# Patient Record
Sex: Female | Born: 1945 | Race: White | Hispanic: No | Marital: Married | Smoking: Never smoker
Health system: Southern US, Community
[De-identification: ages and names within clinical notes are randomized; demographics above are authoritative.]

## PROBLEM LIST (undated history)

## (undated) DIAGNOSIS — E039 Hypothyroidism, unspecified: Secondary | ICD-10-CM

## (undated) DIAGNOSIS — T7840XA Allergy, unspecified, initial encounter: Secondary | ICD-10-CM

## (undated) DIAGNOSIS — J302 Other seasonal allergic rhinitis: Secondary | ICD-10-CM

## (undated) DIAGNOSIS — K5790 Diverticulosis of intestine, part unspecified, without perforation or abscess without bleeding: Secondary | ICD-10-CM

## (undated) DIAGNOSIS — M797 Fibromyalgia: Secondary | ICD-10-CM

## (undated) DIAGNOSIS — I071 Rheumatic tricuspid insufficiency: Secondary | ICD-10-CM

## (undated) DIAGNOSIS — K449 Diaphragmatic hernia without obstruction or gangrene: Secondary | ICD-10-CM

## (undated) DIAGNOSIS — I10 Essential (primary) hypertension: Secondary | ICD-10-CM

## (undated) DIAGNOSIS — K222 Esophageal obstruction: Secondary | ICD-10-CM

## (undated) DIAGNOSIS — G2581 Restless legs syndrome: Secondary | ICD-10-CM

## (undated) DIAGNOSIS — M1990 Unspecified osteoarthritis, unspecified site: Secondary | ICD-10-CM

## (undated) DIAGNOSIS — K219 Gastro-esophageal reflux disease without esophagitis: Secondary | ICD-10-CM

## (undated) HISTORY — PX: ESOPHAGOGASTRODUODENOSCOPY: SHX1529

## (undated) HISTORY — PX: COLONOSCOPY: SHX174

## (undated) HISTORY — DX: Gastro-esophageal reflux disease without esophagitis: K21.9

## (undated) HISTORY — DX: Essential (primary) hypertension: I10

## (undated) HISTORY — DX: Diverticulosis of intestine, part unspecified, without perforation or abscess without bleeding: K57.90

## (undated) HISTORY — DX: Esophageal obstruction: K22.2

## (undated) HISTORY — DX: Other seasonal allergic rhinitis: J30.2

## (undated) HISTORY — DX: Fibromyalgia: M79.7

## (undated) HISTORY — DX: Unspecified osteoarthritis, unspecified site: M19.90

## (undated) HISTORY — DX: Allergy, unspecified, initial encounter: T78.40XA

## (undated) HISTORY — PX: SAPHENOUS VEIN GRAFT RESECTION: SHX2374

## (undated) HISTORY — PX: GRAFT DESCENDING THORACIC AORTA: SUR646

## (undated) HISTORY — PX: BRACHIAL ARTERY GRAFT: SHX1258

## (undated) HISTORY — DX: Diaphragmatic hernia without obstruction or gangrene: K44.9

---

## 2009-10-05 ENCOUNTER — Emergency Department (HOSPITAL_COMMUNITY): Admission: EM | Admit: 2009-10-05 | Discharge: 2009-10-06 | Payer: Self-pay | Admitting: Emergency Medicine

## 2009-10-23 ENCOUNTER — Ambulatory Visit: Payer: Self-pay | Admitting: Internal Medicine

## 2009-10-29 ENCOUNTER — Ambulatory Visit: Payer: Self-pay | Admitting: Internal Medicine

## 2009-10-29 ENCOUNTER — Encounter: Payer: Self-pay | Admitting: Internal Medicine

## 2009-10-30 ENCOUNTER — Encounter: Payer: Self-pay | Admitting: Internal Medicine

## 2011-01-19 ENCOUNTER — Encounter: Payer: Self-pay | Admitting: Internal Medicine

## 2011-04-03 LAB — URINE MICROSCOPIC-ADD ON

## 2011-04-03 LAB — LIPASE, BLOOD: Lipase: 14 U/L (ref 11–59)

## 2011-04-03 LAB — BASIC METABOLIC PANEL
Chloride: 103 mEq/L (ref 96–112)
GFR calc Af Amer: >60 mL/min (ref >60)
GFR calc non Af Amer: >60 mL/min (ref >60)
Potassium: 3.1 mEq/L — ABNORMAL LOW (ref 3.5–5.1)
Sodium: 139 mEq/L (ref 135–145)

## 2011-04-03 LAB — CBC
HCT: 40.7 % (ref 36.0–46.0)
Platelets: 213 10*3/uL (ref 150–400)
WBC: 7.4 10*3/uL (ref 4.0–10.5)

## 2011-04-03 LAB — URINALYSIS, ROUTINE W REFLEX MICROSCOPIC
Bilirubin Urine: NEGATIVE
Leukocytes, UA: NEGATIVE
Nitrite: NEGATIVE
Specific Gravity, Urine: 1.011 (ref 1.005–1.030)
pH: 8 (ref 5.0–8.0)

## 2011-04-03 LAB — HEPATIC FUNCTION PANEL
Albumin: 3.8 g/dL (ref 3.5–5.2)
Alkaline Phosphatase: 90 U/L (ref 39–117)
Total Protein: 7.3 g/dL (ref 6.0–8.3)

## 2011-04-03 LAB — DIFFERENTIAL
Eosinophils Absolute: 0.2 10*3/uL (ref 0.0–0.7)
Lymphocytes Relative: 22 % (ref 12–46)
Lymphs Abs: 1.6 10*3/uL (ref 0.7–4.0)
Neutrophils Relative %: 67 % (ref 43–77)

## 2011-04-03 LAB — POCT CARDIAC MARKERS

## 2012-05-16 ENCOUNTER — Telehealth: Payer: Self-pay | Admitting: Internal Medicine

## 2012-05-16 ENCOUNTER — Ambulatory Visit (HOSPITAL_COMMUNITY): Admission: EM | Admit: 2012-05-16 | Payer: Self-pay | Source: Ambulatory Visit | Admitting: Internal Medicine

## 2012-05-16 ENCOUNTER — Telehealth: Payer: Self-pay

## 2012-05-16 NOTE — Telephone Encounter (Signed)
Please advise--looks like pt might be at ER for endoscopy??

## 2012-05-16 NOTE — Telephone Encounter (Signed)
She called this afternoon with food impaction symptoms. I had her come to [facility] but while on the way the impaction was relieved.  She is able to swallow liquids and is handling secretions.  I evaluated her in ED waiting.  She has been having intermittent dysphagia.  Advised OTC Prilosec or Prevacid and told her we would call to arrange follow-up and probable EGD/dilation with Dr. Juanda Chance.

## 2012-05-16 NOTE — Telephone Encounter (Signed)
Pt states that she is having trouble with her esophagus, she is having to make herself burp and whenever she eats fast the food will not go down she will have to spit it back up, if she eats or drinks too fast it will not go down. Pt would like to know what she should do.

## 2012-05-17 NOTE — Telephone Encounter (Signed)
Phone answered by a child. Child states the patient wants to call me back.

## 2012-05-17 NOTE — Telephone Encounter (Signed)
Unable to reach patient at the numbers in computer. The home number is not working and the other 2 have been disconnected.

## 2012-05-17 NOTE — Telephone Encounter (Signed)
Please set up for EGD/dil., ED listed her tel # [phone], have you tried that?

## 2012-05-17 NOTE — Telephone Encounter (Signed)
Called number below and it was not the phone number for patient but the person answering told me to try [phone]. Tried this number and was unable to reach patient.

## 2012-05-17 NOTE — Telephone Encounter (Signed)
Chart reviewed.  Patient also contacted GI and was advised to go to the ED.  On the way there, her symptoms resolved.  Dr. Leone Payor saw her in ED waiting and is arranging follow-up with Dr. Juanda Chance.

## 2012-05-18 NOTE — Telephone Encounter (Signed)
Addended by: Daphine Deutscher on: 05/18/2012 08:58 AM   Modules accepted: Orders

## 2012-05-18 NOTE — Telephone Encounter (Signed)
Spoke with patient and scheduled her for EGD with dil on 05/25/12 at 4:00 PM at [facility]. Previsit on 05/20/12 ay 4:00 PM

## 2012-05-18 NOTE — Telephone Encounter (Signed)
Spoke with patient's family and was given her cell of [phone]. Spoke with patient and told her Dr. Juanda Chance wants her to have an EGD with dil. Dr. Juanda Chance

## 2012-05-20 ENCOUNTER — Ambulatory Visit (AMBULATORY_SURGERY_CENTER): Payer: Medicare Other | Admitting: *Deleted

## 2012-05-20 DIAGNOSIS — R1310 Dysphagia, unspecified: Secondary | ICD-10-CM

## 2012-05-25 ENCOUNTER — Encounter: Payer: Self-pay | Admitting: Internal Medicine

## 2012-05-25 ENCOUNTER — Ambulatory Visit (AMBULATORY_SURGERY_CENTER): Payer: Medicare Other | Admitting: Internal Medicine

## 2012-05-25 DIAGNOSIS — K222 Esophageal obstruction: Secondary | ICD-10-CM

## 2012-05-25 DIAGNOSIS — R1310 Dysphagia, unspecified: Secondary | ICD-10-CM

## 2012-05-25 MED ORDER — RANITIDINE HCL 150 MG PO TABS: 150.0000 mg | ORAL_TABLET | Freq: Every day | ORAL | Status: DC

## 2012-05-25 MED ORDER — SODIUM CHLORIDE 0.9 % IV SOLN: 500.0000 mL | INTRAVENOUS | Status: DC

## 2012-05-25 NOTE — Progress Notes (Signed)
No complaints noted in the recovery room. Maw  Patient did not experience any of the following events: a burn prior to discharge; a fall within the facility; wrong site/side/patient/procedure/implant event; or a hospital transfer or hospital admission upon discharge from the facility. (G8907) Patient did not have preoperative order for IV antibiotic SSI prophylaxis. (G8918)  

## 2012-05-25 NOTE — Op Note (Signed)
[facility] [address] [place], [place]  [address]  ENDOSCOPY PROCEDURE REPORT  PATIENT:  Melanie Nash, Melanie Nash  MR#:  [id number] BIRTHDATE:  August 18, 1946, 65 yrs. old  GENDER:  female  ENDOSCOPIST:  Hedwig Morton. Juanda Chance, MD Referred by:  Samara Deist, M.D.  PROCEDURE DATE:  05/25/2012 PROCEDURE:  EGD with dilatation over guidewire ASA CLASS:  Class II INDICATIONS:  dysphagia, food impaction recent food impaction, passed spontaneously on the way to the ED  MEDICATIONS:   MAC sedation, administered by CRNA, propofol (Diprivan) 200 mg TOPICAL ANESTHETIC:  none  DESCRIPTION OF PROCEDURE:   After the risks benefits and alternatives of the procedure were thoroughly explained, informed consent was obtained.  The LB GIF-H180 [id number] endoscope was introduced through the mouth and advanced to the second portion of the duodenum, without limitations.  The instrument was slowly withdrawn as the mucosa was fully examined. <<PROCEDUREIMAGES>>  A stricture was found (see image1, image2, image7, and image6). concentric,benign appearing, fibrous stricture at the g-e junction Savary dilation over a guidewire 13,14,15,75mm dilators passed, blood on the last dilator  A hiatal hernia was found (see image5 and image3). 2 cm hiatal hernia  Otherwise the examination was normal (see image4).    Retroflexed views revealed no abnormalities.    The scope was then withdrawn from the patient and the procedure completed.  COMPLICATIONS:  None  ENDOSCOPIC IMPRESSION: 1) Stricture 2) Hiatal hernia 3) Otherwise normal examination benign diastal esophageal stricture, dilated to 101F with Savary dilator RECOMMENDATIONS: 1) Anti-reflux regimen to be follow Ranitidine 150 mg hs  REPEAT EXAM:  In 0 year(s) for.  ______________________________ Hedwig Morton. Juanda Chance, MD  CC:  n. eSIGNED:   Hedwig Morton. Nicha Hemann at 05/25/2012 04:11 PM  Frann Rider, [phone]

## 2012-05-25 NOTE — Patient Instructions (Signed)
Handout was given to your care partner on dilatation diet, anti-reflux and hiatal hernia.  New rx for Ranitidine was also given to your care partner.  You may resume your prior medications today.  Please follow the dilatation diet the rest of the day.  Please call if any questions or concerns.   YOU HAD AN ENDOSCOPIC PROCEDURE TODAY AT THE [facility]: Refer to the procedure report that was given to you for any specific questions about what was found during the examination.  If the procedure report does not answer your questions, please call your gastroenterologist to clarify.  If you requested that your care partner not be given the details of your procedure findings, then the procedure report has been included in a sealed envelope for you to review at your convenience later.  YOU SHOULD EXPECT: Some feelings of bloating in the abdomen. Passage of more gas than usual.  Walking can help get rid of the air that was put into your GI tract during the procedure and reduce the bloating. If you had a lower endoscopy (such as a colonoscopy or flexible sigmoidoscopy) you may notice spotting of blood in your stool or on the toilet paper. If you underwent a bowel prep for your procedure, then you may not have a normal bowel movement for a few days.  DIET:   Drink plenty of fluids but you should avoid alcoholic beverages for 24 hours.  Please follow the dilatation diet the rest of the day.  ACTIVITY: Your care partner should take you home directly after the procedure.  You should plan to take it easy, moving slowly for the rest of the day.  You can resume normal activity the day after the procedure however you should NOT DRIVE or use heavy machinery for 24 hours (because of the sedation medicines used during the test).    SYMPTOMS TO REPORT IMMEDIATELY: A gastroenterologist can be reached at any hour.  During normal business hours, 8:30 AM to 5:00 PM Monday through Friday, call [phone].  After  hours and on weekends, please call the GI answering service at [phone] who will take a message and have the physician on call contact you.     Following upper endoscopy (EGD)  Vomiting of blood or coffee ground material  New chest pain or pain under the shoulder blades  Painful or persistently difficult swallowing  New shortness of breath  Fever of 100F or higher  Black, tarry-looking stools  FOLLOW UP: If any biopsies were taken you will be contacted by phone or by letter within the next 1-3 weeks.  Call your gastroenterologist if you have not heard about the biopsies in 3 weeks.  Our staff will call the home number listed on your records the next business day following your procedure to check on you and address any questions or concerns that you may have at that time regarding the information given to you following your procedure. This is a courtesy call and so if there is no answer at the home number and we have not heard from you through the emergency physician on call, we will assume that you have returned to your regular daily activities without incident.  SIGNATURES/CONFIDENTIALITY: You and/or your care partner have signed paperwork which will be entered into your electronic medical record.  These signatures attest to the fact that that the information above on your After Visit Summary has been reviewed and is understood.  Full responsibility of the confidentiality of this discharge  information lies with you and/or your care-partner.

## 2012-05-26 ENCOUNTER — Telehealth: Payer: Self-pay | Admitting: *Deleted

## 2012-05-26 NOTE — Telephone Encounter (Signed)
No answer left message to call if questions or concerns. 

## 2012-05-28 NOTE — Progress Notes (Signed)
Addended by: Maple Hudson on: 05/28/2012 07:34 AM   Modules accepted: Level of Service

## 2014-03-03 ENCOUNTER — Encounter: Payer: Self-pay | Admitting: Internal Medicine

## 2014-03-08 ENCOUNTER — Encounter: Payer: Self-pay | Admitting: *Deleted

## 2014-05-02 ENCOUNTER — Encounter: Payer: Self-pay | Admitting: Internal Medicine

## 2014-05-02 ENCOUNTER — Ambulatory Visit (INDEPENDENT_AMBULATORY_CARE_PROVIDER_SITE_OTHER): Payer: Medicare HMO | Admitting: Internal Medicine

## 2014-05-02 VITALS — BP 124/90 | HR 84 | Ht 61.5 in | Wt 166.4 lb

## 2014-05-02 DIAGNOSIS — R1319 Other dysphagia: Secondary | ICD-10-CM

## 2014-05-02 NOTE — Progress Notes (Signed)
Melanie Nash 08/02/1946 [id number]  Note: This dictation was prepared with Dragon digital system. Any transcriptional errors that result from this procedure are unintentional.   History of Present Illness:  This is a 68 year old white female with recurrent solid food dysphagia. She has a history of esophageal stricture which was dilated in May 2013. She had a food impaction prior to that which passed spontaneously on the way to the emergency room. She was subsequently dilated with 13, 14, 15 and 16 mm dilators. She has a hiatal hernia. Patient has not been taking any acid reducing agents. She denies heartburn but has some substernal chest pain when she eats. She has had multiple episodes of food regurgitation. Her colorectal screening is up to date. Her last colonoscopy in November 2010 showed mild diverticulosis and prep induced inflammation in the left colon.    Past Medical History  Diagnosis Date  . Fibromyalgia   . Seasonal allergies   . Arthritis   . GERD (gastroesophageal reflux disease)   . Diverticulosis   . Hiatal hernia   . Esophageal stricture     Past Surgical History  Procedure Laterality Date  . Esophagogastroduodenoscopy      dysphagia    No Known Allergies  Family history and social history have been reviewed.  Review of Systems: Solid food dysphagia. Negative for cough asthma or hoarseness  The remainder of the 10 point ROS is negative except as outlined in the H&P  Physical Exam: General Appearance Well developed, in no distress Eyes  Non icteric  HEENT  Non traumatic, normocephalic  Mouth No lesion, tongue papillated, no cheilosis Neck Supple without adenopathy, thyroid not enlarged, no carotid bruits, no JVD Lungs Clear to auscultation bilaterally COR Normal S1, normal S2, regular rhythm, no murmur, quiet precordium Abdomen soft nontender Rectal not done Extremities  No pedal edema Skin No lesions Neurological Alert and oriented x 3 Psychological  Normal mood and affect  Assessment and Plan:   Problem #1 Recurrent solid food dysphagia consistent with benign distal esophageal stricture. Her last dilatation was 2 years ago. I have discussed gastroesophageal reflux with the patient and asked her to take PPIs on a daily basis. We will proceed with an upper endoscopy and biopsies to rule out Barrett's esophagus and esophageal dilation. We will also remind her of an appropriate antireflux regimen. She takes occasional ibuprofen but not on a regular basis.  Problem #2 Colorectal screening. Patient's last colonoscopy was in November 2010. Her next colonoscopy will be due in November 2020.    Lafayette Dragon 05/02/2014

## 2014-05-02 NOTE — Patient Instructions (Addendum)
You have been scheduled for an endoscopy with propofol. Please follow written instructions given to you at your visit today. If you use inhalers (even only as needed), please bring them with you on the day of your procedure. Your physician has requested that you go to www.startemmi.com and enter the access code given to you at your visit today. This web site gives a general overview about your procedure. However, you should still follow specific instructions given to you by our office regarding your preparation for the procedure.  CC: Dr Judeen Hammans

## 2014-05-03 ENCOUNTER — Encounter: Payer: Medicare HMO | Admitting: Internal Medicine

## 2014-05-08 ENCOUNTER — Encounter: Payer: Self-pay | Admitting: Internal Medicine

## 2014-05-10 ENCOUNTER — Encounter: Payer: Self-pay | Admitting: Internal Medicine

## 2014-05-10 ENCOUNTER — Ambulatory Visit (AMBULATORY_SURGERY_CENTER): Payer: Medicare HMO | Admitting: Internal Medicine

## 2014-05-10 VITALS — BP 166/94 | HR 83 | Temp 97.9°F | Resp 19 | Ht 62.0 in | Wt 161.0 lb

## 2014-05-10 DIAGNOSIS — R1310 Dysphagia, unspecified: Secondary | ICD-10-CM

## 2014-05-10 DIAGNOSIS — K222 Esophageal obstruction: Secondary | ICD-10-CM

## 2014-05-10 MED ORDER — SODIUM CHLORIDE 0.9 % IV SOLN: 500.0000 mL | INTRAVENOUS | Status: DC

## 2014-05-10 MED ORDER — OMEPRAZOLE 20 MG PO CPDR: 20.0000 mg | DELAYED_RELEASE_CAPSULE | Freq: Every day | ORAL | Status: DC

## 2014-05-10 NOTE — Progress Notes (Signed)
Report to pacu rn, vss, bbs=clear 

## 2014-05-10 NOTE — Patient Instructions (Signed)
YOU HAD AN ENDOSCOPIC PROCEDURE TODAY AT [facility]: Refer to the procedure report that was given to you for any specific questions about what was found during the examination.  If the procedure report does not answer your questions, please call your gastroenterologist to clarify.  If you requested that your care partner not be given the details of your procedure findings, then the procedure report has been included in a sealed envelope for you to review at your convenience later.  YOU SHOULD EXPECT: Some feelings of bloating in the abdomen. Passage of more gas than usual.  Walking can help get rid of the air that was put into your GI tract during the procedure and reduce the bloating. If you had a lower endoscopy (such as a colonoscopy or flexible sigmoidoscopy) you may notice spotting of blood in your stool or on the toilet paper. If you underwent a bowel prep for your procedure, then you may not have a normal bowel movement for a few days.  DIET: Your first meal following the procedure should be a light meal and then it is ok to progress to your normal diet.  A half-sandwich or bowl of soup is an example of a good first meal.  Heavy or fried foods are harder to digest and may make you feel nauseous or bloated.  Likewise meals heavy in dairy and vegetables can cause extra gas to form and this can also increase the bloating.  Drink plenty of fluids but you should avoid alcoholic beverages for 24 hours.  ACTIVITY: Your care partner should take you home directly after the procedure.  You should plan to take it easy, moving slowly for the rest of the day.  You can resume normal activity the day after the procedure however you should NOT DRIVE or use heavy machinery for 24 hours (because of the sedation medicines used during the test).    SYMPTOMS TO REPORT IMMEDIATELY: A gastroenterologist can be reached at any hour.  During normal business hours, 8:30 AM to 5:00 PM Monday through Friday,  call [phone].  After hours and on weekends, please call the GI answering service at [phone] who will take a message and have the physician on call contact you.     Following upper endoscopy (EGD)  Vomiting of blood or coffee ground material  New chest pain or pain under the shoulder blades  Painful or persistently difficult swallowing  New shortness of breath  Fever of 100F or higher  Black, tarry-looking stools  FOLLOW UP: If any biopsies were taken you will be contacted by phone or by letter within the next 1-3 weeks.  Call your gastroenterologist if you have not heard about the biopsies in 3 weeks.  Our staff will call the home number listed on your records the next business day following your procedure to check on you and address any questions or concerns that you may have at that time regarding the information given to you following your procedure. This is a courtesy call and so if there is no answer at the home number and we have not heard from you through the emergency physician on call, we will assume that you have returned to your regular daily activities without incident.   Stricture information given.  Anti reflux regimen information given.  Start Prilosec as directed.  SIGNATURES/CONFIDENTIALITY: You and/or your care partner have signed paperwork which will be entered into your electronic medical record.  These signatures attest to the fact that  that the information above on your After Visit Summary has been reviewed and is understood.  Full responsibility of the confidentiality of this discharge information lies with you and/or your care-partner.

## 2014-05-10 NOTE — Op Note (Signed)
[employer]  [address]. [place], [address]   ENDOSCOPY PROCEDURE REPORT  PATIENT: Melanie Nash, Melanie Nash  MR#: [id number] BIRTHDATE: [birth year] , 67  yrs. old GENDER: Female ENDOSCOPIST: Lafayette Dragon, MD REFERRED BY:  Judeen Hammans, M.D. PROCEDURE DATE:  05/10/2014 PROCEDURE:  EGD, diagnostic and Savary dilation of esophagus ASA CLASS:     Class II INDICATIONS:  Dysphagia.   dysphagia to solids.  History of food impaction.  Last endoscopy May 2013.  Reason onset of dysphagia. MEDICATIONS: MAC sedation, administered by CRNA and Propofol (Diprivan) 180 mg IV TOPICAL ANESTHETIC: Cetacaine Spray  DESCRIPTION OF PROCEDURE: After the risks benefits and alternatives of the procedure were thoroughly explained, informed consent was obtained.  The LB GIF-H180 Loaner [id number] endoscope was introduced through the mouth and advanced to the second portion of the duodenum. Without limitations.  The instrument was slowly withdrawn as the mucosa was fully examined.      Esophagus: Proximal and midesophagus was normal. There was a concentric benign appearing distal esophageal stricture located at the GE junction which initially did not allow the scope to pass through but with mild resistance and pressure the endoscope was able to traverse into the stomach. There was small amount of bleeding from the stricture. Distal to the stricture was a nonreducible 3 cm hiatal hernia Stomach gastric folds were normal gastric antrum pyloric outlet were unremarkable. Retroflexion of the endoscope confirmed presence of hiatal hernia  Duodenum: duodenal bulb and descending duodenum was normal[ Savary dilators to pass through the stricture starting with a 13 mm, 14 mm, 15 mm, 16 mm dilators there was blood on the last 2 dilators.          The scope was then withdrawn from the patient and the procedure completed.  COMPLICATIONS: There were no complications. ENDOSCOPIC IMPRESSION: benign distal  esophageal stricture status post dilation from 13-to 16 mm    with Savary dilators 3 cm nonreducible hiatal hernia RECOMMENDATIONS: 1.  Anti-reflux regimen to be follow 2.  continue acid suppressing therapy repeat dilatation as needed  REPEAT EXAM:  eSigned:  Lafayette Dragon, MD 05/10/2014 3:06 PM   CC:  PATIENT NAME:  Melanie Nash, Melanie Nash MR#: [id number]

## 2014-05-10 NOTE — Progress Notes (Signed)
Called to room to assist during endoscopic procedure.  Patient ID and intended procedure confirmed with present staff. Received instructions for my participation in the procedure from the performing physician.  

## 2014-05-11 ENCOUNTER — Encounter: Payer: Self-pay | Admitting: Internal Medicine

## 2014-05-11 ENCOUNTER — Telehealth: Payer: Self-pay | Admitting: *Deleted

## 2014-05-11 NOTE — Telephone Encounter (Signed)
  Follow up Call-  Call back number 05/10/2014 05/25/2012  Post procedure Call Back phone  # [phone] home  [phone] cell [phone]  Permission to leave phone message Yes Yes     Patient questions:  Do you have a fever, pain , or abdominal swelling? no Pain Score  0 *  Have you tolerated food without any problems? yes  Have you been able to return to your normal activities? yes  Do you have any questions about your discharge instructions: Diet   no Medications  no Follow up visit  no  Do you have questions or concerns about your Care? no  Actions: * If pain score is 4 or above: No action needed, pain <4.

## 2015-05-31 ENCOUNTER — Telehealth: Payer: Self-pay | Admitting: *Deleted

## 2015-05-31 DIAGNOSIS — K219 Gastro-esophageal reflux disease without esophagitis: Secondary | ICD-10-CM

## 2015-05-31 DIAGNOSIS — K222 Esophageal obstruction: Secondary | ICD-10-CM

## 2015-05-31 MED ORDER — OMEPRAZOLE 20 MG PO CPDR: 20.0000 mg | DELAYED_RELEASE_CAPSULE | Freq: Every day | ORAL | Status: DC

## 2015-05-31 NOTE — Telephone Encounter (Signed)
Sent Rx for omeprazole, 20 mg, #30 with 11 refills to [facility].

## 2016-02-26 ENCOUNTER — Ambulatory Visit (HOSPITAL_COMMUNITY)
Admission: EM | Admit: 2016-02-26 | Discharge: 2016-02-26 | Disposition: A | Discharge status: Home / Self Care | Payer: PPO | Attending: Emergency Medicine | Admitting: Emergency Medicine

## 2016-02-26 ENCOUNTER — Encounter (HOSPITAL_COMMUNITY)
Admission: EM | Disposition: A | Discharge status: Home / Self Care | Payer: Self-pay | Source: Home / Self Care | Attending: Emergency Medicine

## 2016-02-26 ENCOUNTER — Telehealth: Payer: Self-pay | Admitting: Internal Medicine

## 2016-02-26 ENCOUNTER — Encounter (HOSPITAL_COMMUNITY): Payer: Self-pay | Admitting: Emergency Medicine

## 2016-02-26 DIAGNOSIS — Y9389 Activity, other specified: Secondary | ICD-10-CM | POA: Diagnosis not present

## 2016-02-26 DIAGNOSIS — T18128A Food in esophagus causing other injury, initial encounter: Secondary | ICD-10-CM | POA: Diagnosis not present

## 2016-02-26 DIAGNOSIS — K222 Esophageal obstruction: Secondary | ICD-10-CM | POA: Insufficient documentation

## 2016-02-26 DIAGNOSIS — X58XXXA Exposure to other specified factors, initial encounter: Secondary | ICD-10-CM | POA: Diagnosis not present

## 2016-02-26 DIAGNOSIS — M797 Fibromyalgia: Secondary | ICD-10-CM | POA: Diagnosis not present

## 2016-02-26 DIAGNOSIS — K219 Gastro-esophageal reflux disease without esophagitis: Secondary | ICD-10-CM | POA: Insufficient documentation

## 2016-02-26 DIAGNOSIS — R1310 Dysphagia, unspecified: Secondary | ICD-10-CM | POA: Diagnosis not present

## 2016-02-26 HISTORY — PX: ESOPHAGOGASTRODUODENOSCOPY: SHX5428

## 2016-02-26 LAB — CBC WITH DIFFERENTIAL/PLATELET
Basophils Absolute: 0 10*3/uL (ref 0.0–0.1)
Basophils Relative: 0 %
Eosinophils Absolute: 0.2 10*3/uL (ref 0.0–0.7)
Eosinophils Relative: 3 %
HCT: 45.4 % (ref 36.0–46.0)
Hemoglobin: 15.5 g/dL — ABNORMAL HIGH (ref 12.0–15.0)
Lymphocytes Relative: 31 %
Lymphs Abs: 2.5 10*3/uL (ref 0.7–4.0)
MCH: 30.2 pg (ref 26.0–34.0)
MCHC: 34.1 g/dL (ref 30.0–36.0)
MCV: 88.3 fL (ref 78.0–100.0)
Monocytes Absolute: 0.6 10*3/uL (ref 0.1–1.0)
Monocytes Relative: 7 %
Neutro Abs: 4.8 10*3/uL (ref 1.7–7.7)
Neutrophils Relative %: 59 %
Platelets: 223 10*3/uL (ref 150–400)
RBC: 5.14 MIL/uL — ABNORMAL HIGH (ref 3.87–5.11)
RDW: 13.4 % (ref 11.5–15.5)
WBC: 8.1 10*3/uL (ref 4.0–10.5)

## 2016-02-26 LAB — I-STAT CHEM 8, ED
BUN: 18 mg/dL (ref 6–20)
Calcium, Ion: 1.11 mmol/L — ABNORMAL LOW (ref 1.13–1.30)
Chloride: 106 mmol/L (ref 101–111)
Creatinine, Ser: 0.8 mg/dL (ref 0.44–1.00)
Glucose, Bld: 89 mg/dL (ref 65–99)
HCT: 49 % — ABNORMAL HIGH (ref 36.0–46.0)
Hemoglobin: 16.7 g/dL — ABNORMAL HIGH (ref 12.0–15.0)
Potassium: 3.6 mmol/L (ref 3.5–5.1)
Sodium: 144 mmol/L (ref 135–145)
TCO2: 26 mmol/L (ref 0–100)

## 2016-02-26 SURGERY — EGD (ESOPHAGOGASTRODUODENOSCOPY)
Anesthesia: Moderate Sedation

## 2016-02-26 MED ORDER — MIDAZOLAM HCL 10 MG/2ML IJ SOLN
INTRAMUSCULAR | Status: DC | PRN
Start: 2016-02-26 — End: 2016-02-26
  Administered 2016-02-26: 1 mg via INTRAVENOUS
  Administered 2016-02-26: 2 mg via INTRAVENOUS
  Administered 2016-02-26: 1 mg via INTRAVENOUS

## 2016-02-26 MED ORDER — BUTAMBEN-TETRACAINE-BENZOCAINE 2-2-14 % EX AERO
INHALATION_SPRAY | CUTANEOUS | Status: DC | PRN
  Administered 2016-02-26: 2 via TOPICAL

## 2016-02-26 MED ORDER — GLUCAGON HCL RDNA (DIAGNOSTIC) 1 MG IJ SOLR
1.0000 mg | Freq: Once | INTRAMUSCULAR | Status: AC
Start: 2016-02-26 — End: 2016-02-26
  Administered 2016-02-26: 1 mg via INTRAVENOUS
  Filled 2016-02-26: qty 1

## 2016-02-26 MED ORDER — ONDANSETRON HCL 4 MG/2ML IJ SOLN
4.0000 mg | Freq: Once | INTRAMUSCULAR | Status: AC
  Administered 2016-02-26: 4 mg via INTRAVENOUS
  Filled 2016-02-26: qty 2

## 2016-02-26 MED ORDER — OMEPRAZOLE 20 MG PO CPDR: 20.0000 mg | DELAYED_RELEASE_CAPSULE | Freq: Two times a day (BID) | ORAL | Status: DC

## 2016-02-26 MED ORDER — FENTANYL CITRATE (PF) 100 MCG/2ML IJ SOLN
INTRAMUSCULAR | Status: AC
  Filled 2016-02-26: qty 2

## 2016-02-26 MED ORDER — DIPHENHYDRAMINE HCL 50 MG/ML IJ SOLN
INTRAMUSCULAR | Status: AC
  Filled 2016-02-26: qty 1

## 2016-02-26 MED ORDER — MIDAZOLAM HCL 5 MG/ML IJ SOLN
INTRAMUSCULAR | Status: AC
  Filled 2016-02-26: qty 2

## 2016-02-26 MED ORDER — FENTANYL CITRATE (PF) 100 MCG/2ML IJ SOLN
INTRAMUSCULAR | Status: DC | PRN
  Administered 2016-02-26: 12.5 ug via INTRAVENOUS
  Administered 2016-02-26: 25 ug via INTRAVENOUS

## 2016-02-26 NOTE — ED Provider Notes (Signed)
CSN: [id number]     Arrival date & time 02/26/16  1300 History   First MD Initiated Contact with Patient 02/26/16 1538     Chief Complaint  Patient presents with  . Dysphagia   Patient is a 70 y.o. female presenting with general illness. The history is provided by the patient and a relative. No language interpreter was used.  Illness Location:  Esophagus Quality:  Difficulty fallowing, food bolus Severity:  Moderate Onset quality:  Gradual Duration:  5 hours Timing:  Intermittent Progression:  Partially resolved Chronicity:  New Context:  History of esophageal stricture s/p dilation.  Relieved by:  None Worsened by:  None Ineffective treatments:  PO intake, liquids Associated symptoms: no abdominal pain, no chest pain, no congestion, no cough, no diarrhea, no ear pain, no fever, no headaches, no nausea, no rash, no rhinorrhea, no shortness of breath, no sore throat, no vomiting and no wheezing     Past Medical History  Diagnosis Date  . Fibromyalgia   . Seasonal allergies   . Arthritis   . GERD (gastroesophageal reflux disease)   . Diverticulosis   . Hiatal hernia   . Esophageal stricture    Past Surgical History  Procedure Laterality Date  . Esophagogastroduodenoscopy      dysphagia   Family History  Problem Relation Age of Onset  . Colon cancer Neg Hx   . Stomach cancer Neg Hx   . Heart disease Mother   . Diabetes Sister   . Diabetes Maternal Uncle    Social History  Substance Use Topics  . Smoking status: Never Smoker   . Smokeless tobacco: Never Used  . Alcohol Use: No   OB History    No data available     Review of Systems  Constitutional: Negative for fever, chills, activity change and appetite change.  HENT: Positive for drooling and trouble swallowing. Negative for congestion, dental problem, ear pain, facial swelling, hearing loss, rhinorrhea, sneezing, sore throat and voice change.   Eyes: Negative for photophobia, pain, redness and visual  disturbance.  Respiratory: Negative for apnea, cough, chest tightness, shortness of breath, wheezing and stridor.   Cardiovascular: Negative for chest pain, palpitations and leg swelling.  Gastrointestinal: Negative for nausea, vomiting, abdominal pain, diarrhea, constipation, blood in stool and abdominal distention.  Endocrine: Negative for polydipsia and polyuria.  Genitourinary: Negative for frequency, hematuria, flank pain, decreased urine volume and difficulty urinating.  Musculoskeletal: Negative for back pain, joint swelling, gait problem, neck pain and neck stiffness.  Skin: Negative for rash and wound.  Allergic/Immunologic: Negative for immunocompromised state.  Neurological: Negative for dizziness, syncope, facial asymmetry, speech difficulty, weakness, light-headedness, numbness and headaches.  Hematological: Negative for adenopathy.  Psychiatric/Behavioral: Negative for suicidal ideas, behavioral problems, confusion, sleep disturbance and agitation. The patient is not nervous/anxious.   All other systems reviewed and are negative.     Allergies  Review of patient's allergies indicates no known allergies.  Home Medications   Prior to Admission medications   Medication Sig Start Date End Date Taking? Authorizing Provider  Cholecalciferol (VITAMIN D-3) 1000 UNITS CAPS Take 1 capsule by mouth daily.   Yes Historical Provider, MD  Cyanocobalamin (VITAMIN B-12 PO) Take 1 tablet by mouth daily.    Yes Historical Provider, MD  fish oil-omega-3 fatty acids 1000 MG capsule Take 2 g by mouth daily.   Yes Historical Provider, MD  GARLIC PO Take 1 tablet by mouth daily.   Yes Historical Provider, MD  MAGNESIUM PO Take  1 tablet by mouth daily.    Yes Historical Provider, MD  Multiple Vitamins-Minerals (ICAPS PO) Take 1 tablet by mouth daily.    Yes Historical Provider, MD  TURMERIC PO Take 1 tablet by mouth daily.   Yes Historical Provider, MD  omeprazole (PRILOSEC) 20 MG capsule Take 1  capsule (20 mg total) by mouth 2 (two) times daily before a meal. Take this medication at [facility] 02/26/16   Vira Blanco, MD   BP 171/108 mmHg  Pulse 91  Temp(Src) 97.8 F (36.6 C) (Oral)  Resp 18  Ht 5\' 2"  (1.575 m)  Wt 70.308 kg  BMI 28.34 kg/m2  SpO2 99% Physical Exam  Constitutional: She is oriented to person, place, and time. She appears well-developed and well-nourished. No distress.  HENT:  Head: Normocephalic and atraumatic.  Right Ear: External ear normal.  Left Ear: External ear normal.  Mouth/Throat: Uvula is midline and oropharynx is clear and moist. No trismus in the jaw. Normal dentition. No uvula swelling. No oropharyngeal exudate.  Eyes: Pupils are equal, round, and reactive to light. Right eye exhibits no discharge. Left eye exhibits no discharge.  Neck: Normal range of motion. No JVD present. No tracheal deviation present.  Cardiovascular: Normal rate, regular rhythm and normal heart sounds.  Exam reveals no friction rub.   No murmur heard. Pulmonary/Chest: Effort normal and breath sounds normal. No stridor. No respiratory distress. She has no wheezes.  Abdominal: Soft. Bowel sounds are normal. She exhibits no distension. There is no rebound and no guarding.  Musculoskeletal: Normal range of motion. She exhibits no edema or tenderness.  Lymphadenopathy:    She has no cervical adenopathy.  Neurological: She is alert and oriented to person, place, and time. No cranial nerve deficit. Coordination normal.  Skin: Skin is warm and dry. No rash noted. No pallor.  Psychiatric: She has a normal mood and affect. Her behavior is normal. Judgment and thought content normal.  Nursing note and vitals reviewed.   ED Course  Procedures (including critical care time) Labs Review Labs Reviewed  CBC WITH DIFFERENTIAL/PLATELET - Abnormal; Notable for the following:    RBC 5.14 (*)    Hemoglobin 15.5 (*)    All other components within normal limits  I-STAT CHEM 8, ED - Abnormal;  Notable for the following:    Calcium, Ion 1.11 (*)    Hemoglobin 16.7 (*)    HCT 49.0 (*)    All other components within normal limits    Imaging Review No results found. I have personally reviewed and evaluated these images and lab results as part of my medical decision-making.   EKG Interpretation None      MDM   Final diagnoses:  Food impaction of esophagus, initial encounter   Patient with history of esophageal stricture status post 3 dilations presents for suspected pork chop piece stuck in esophagus. This occurred around breakfast time this morning. She was having difficulty handling her own secretions in the ED.  Upon returning back to her room patient afebrile with normal heart rate. Blood pressure mildly hypertensive. She is satting fine on room air. No respiratory involvement. Patient tolerating her secretions at this time. She feels improvement in her symptoms. We attempted a by mouth challenge as patient was tolerating her secretions and felt improvement in her sympto in 7-10 days repeat EGD.ms. After several sips of Sprite she spit up.  Ordered IV, basic labs, glucagon. Patient will likely need GI consult at this time.  6:23 PM: Discussed  with gastroenterology who come to bedside and evaluated patient for likely endoscopy.  GI came to bedside performed endoscopy. There was able to push food material past the lower esophageal sphincter. Patient feels better. Patient to take omeprazole twice a day per GI follow-up as an outpatient.  Patient at baseline prior to discharge.    Discussed with Dr. Sabra Heck.    Vira Blanco, MD 02/26/16 [id number]  Noemi Chapel, MD 02/27/16 [phone]

## 2016-02-26 NOTE — H&P (Signed)
  HPI: This is a woman with food impaction   Chief complaint is food impaction  Pork eaten several hours ago, feels still stuck in esohagus.  + spit cup at bedside.  H/o esophageal strictures (last dilated 2015 with Dr. Olevia Perches, savory).  Takes PPI most days of the week.   Past Medical History  Diagnosis Date  . Fibromyalgia   . Seasonal allergies   . Arthritis   . GERD (gastroesophageal reflux disease)   . Diverticulosis   . Hiatal hernia   . Esophageal stricture     Past Surgical History  Procedure Laterality Date  . Esophagogastroduodenoscopy      dysphagia    No current facility-administered medications for this encounter.   Current Outpatient Prescriptions  Medication Sig Dispense Refill  . Cholecalciferol (VITAMIN D-3) 1000 UNITS CAPS Take 1 capsule by mouth daily.    . Cyanocobalamin (VITAMIN B-12 PO) Take 1 tablet by mouth daily.     . fish oil-omega-3 fatty acids 1000 MG capsule Take 2 g by mouth daily.    Marland Kitchen GARLIC PO Take 1 tablet by mouth daily.    Marland Kitchen MAGNESIUM PO Take 1 tablet by mouth daily.     . Multiple Vitamins-Minerals (ICAPS PO) Take 1 tablet by mouth daily.     Marland Kitchen omeprazole (PRILOSEC) 20 MG capsule Take 1 capsule (20 mg total) by mouth daily. Take this medication at [facility]. 30 capsule 11  . TURMERIC PO Take 1 tablet by mouth daily.      Allergies as of 02/26/2016  . (No Known Allergies)    Family History  Problem Relation Age of Onset  . Colon cancer Neg Hx   . Stomach cancer Neg Hx   . Heart disease Mother   . Diabetes Sister   . Diabetes Maternal Uncle     Social History   Social History  . Marital Status: Married    Spouse Name: N/A  . Number of Children: 2  . Years of Education: N/A   Occupational History  . caterer    Social History Main Topics  . Smoking status: Never Smoker   . Smokeless tobacco: Never Used  . Alcohol Use: No  . Drug Use: No  . Sexual Activity: Not on file   Other Topics Concern  . Not on file   Social  History Narrative     Physical Exam: BP 181/98 mmHg  Pulse 83  Temp(Src) 97.7 F (36.5 C) (Oral)  Resp 16  Ht 5\' 2"  (1.575 m)  Wt 155 lb (70.308 kg)  BMI 28.34 kg/m2  SpO2 98% Constitutional: generally well-appearing Psychiatric: alert and oriented x3 Abdomen: soft, nontender, nondistended, no obvious ascites, no peritoneal signs, normal bowel sounds   Assessment and plan: 70 y.o. female with esophageal food impaction  EGD in ER   Owens Loffler, MD [facility] 02/26/2016, 7:37 PM

## 2016-02-26 NOTE — Discharge Instructions (Signed)
Esophageal Stricture °Esophageal stricture is a condition that causes the esophagus to become narrow. The esophagus is the long tube in your throat that carries food and liquid from your mouth to your stomach. Esophageal stricture can make it difficult, painful, or even impossible to swallow. The condition also makes choking more likely.  °CAUSES  °Gastroesophageal reflux disease (GERD) is the most common cause of esophageal stricture. In GERD, stomach acid backs up into the esophagus. Over time, this causes scar tissue and leads to narrowing (stricture). °Other causes of esophageal stricture include: °· Scarring from ingesting a harmful substance. °· Damage from medical instruments used in the esophagus. °· Radiation therapy. °· Cancer. °RISK FACTORS °You are at greater risk for esophageal stricture if you have GERD or esophageal cancer. °SIGNS AND SYMPTOMS  °Signs and symptoms of esophageal stricture include: °· Difficulty swallowing. °· Pain when swallowing. °· Heartburn. °· Vomiting or spitting up (regurgitating) food or liquids. °· Weight loss.   °DIAGNOSIS  °Your health care provider may suspect esophageal stricture based on your symptoms. A physical exam will also be done. You may need tests to confirm the diagnosis. These can include: °· Upper endoscopy. Your health care provider will insert a flexible tube with a tiny camera on it (endoscope) into your esophagus to check for a stricture. A tissue sample may also be taken to be examined under a microscope (biopsy). °· Esophageal pH monitoring. This test involves collecting acid in the esophagus with a tube to determine how much stomach acid is entering the esophagus. °· Barium swallow test. For this test, you will drink a barium solution that coats the lining of the esophagus. Then you will have an X-ray taken. The barium solution helps to show if there is stricture. °TREATMENT °Treatment for esophageal stricture depends on what is causing your condition and  how severe it is. Treatment options include: °· Esophageal dilatation. In this procedure, a health care provider inserts an endoscope or a tool called a dilator into your esophagus to gently stretch it and make the opening wider. °· Stents. In some cases, your health care provider may place a small device (stent) in the esophagus to keep it open. °· Acid-blocking medicines. Taking these helps manage GERD symptoms after an esophageal stricture. This can prevent the stricture from returning. °HOME CARE INSTRUCTIONS °· Do not drink alcohol. °· Do not use any tobacco products, including cigarettes, chewing tobacco, or electronic cigarettes. If you need help quitting, ask your health care provider. °· Lose weight if you are overweight. °· Wear loose, comfortable clothing. °· Do not eat for 3 hours before bedtime. °· Elevate your head in bed with pillows. °· Do not overeat at meals. °· Do not eat foods that make reflux worse. These include: °¨ Fatty foods. °¨ Spicy foods. °¨ Soda. °¨ Tomato products. °¨ Chocolate. °SEEK MEDICAL CARE IF: °· You have problems eating or swallowing. °· You regurgitate food and liquid. °MAKE SURE YOU: °· Understand these instructions. °· Will watch your condition. °· Will get help right away if you are not doing well or get worse. °  °This information is not intended to replace advice given to you by your health care provider. Make sure you discuss any questions you have with your health care provider. °  °Document Released: 08/25/2006 Document Revised: 01/05/2015 Document Reviewed: 04/26/2014 °Elsevier Interactive Patient Education ©2016 [employer] ° °

## 2016-02-26 NOTE — Telephone Encounter (Signed)
Left message for pt to call back  °

## 2016-02-26 NOTE — ED Notes (Signed)
Pt states she ate a piece of a pork chop this am and now feels like it didn't go down all the way. Pt has history of esophagus stretched 3 times. Pt has no sob or trouble speaking.

## 2016-02-26 NOTE — ED Notes (Signed)
Pt asked to be contacted on cell phone number listed on records for follow up appt

## 2016-02-26 NOTE — Op Note (Signed)
ENDOSCOPY PROCEDURE REPORT  PATIENT: Melanie Nash, Melanie Nash  MR#: [id number]  BIRTHDATE: May 04, 1946 , 69  yrs. old GENDER: female  ENDOSCOPIST: Collene Schlichter, MD  PROCEDURE DATE:  02/26/2016  PROCEDURE:  esophagoscopy w/ fb removal    ASA CLASS:     Class II  INDICATIONS:  esophageal food impaction; h/o esophageal stricture (dilated Dr.  Olevia Perches most recently 2015); sporadically takes PPI.   MEDICATIONS: Fentanyl 37.5 mcg IV and Versed 4 mg IV     TOPICAL ANESTHETIC: Cetacaine Spray   DESCRIPTION OF PROCEDURE: After the risks benefits and alternatives of the procedure were thoroughly explained, informed consent was obtained.  The Pentax Gastroscope [id number] endoscope was introduced through the mouth and advanced to the second portion of the duodenum , Without limitations.  The instrument was slowly withdrawn as the mucosa was fully examined. images    There was a white meat food bolus in the distal esophagus, obstructing the lumen.  Using a suction cap, I removed several pieces of the bolus and the remaining food bolus passed into the stomach.  There was an inflamed, focal, peptic appearing stricture at the GE junction with 6-66mm lumen.  I did not dilate the stricture today.  I did not extend the examination into her stomach.  Retroflexion was not performed.     The scope was then withdrawn from the patient and the procedure completed.  COMPLICATIONS: There were no immediate complications.      ENDOSCOPIC IMPRESSION: There was a white meat food bolus in the distal esophagus, obstructing the lumen.  Using a suction cap, I removed several pieces of the bolus and the remaining food bolus passed into the stomach.  There was an inflamed, focal, peptic appearing stricture at the GE junction with 6-69mm lumen.  I did not dilate the stricture today.  I did not extend the examination into her stomach     RECOMMENDATIONS: She will increase to twice daily PPI, to be prescribed by the ER. She will chew her  food very well, eat slowly and take small bites. My office will contact her about repeat EGD with dilation in the next 7-10 days.

## 2016-02-26 NOTE — ED Notes (Signed)
Endoscopy team at bedside 

## 2016-02-27 ENCOUNTER — Encounter (HOSPITAL_COMMUNITY): Payer: Self-pay | Admitting: Gastroenterology

## 2016-02-27 ENCOUNTER — Telehealth: Payer: Self-pay

## 2016-02-27 NOTE — Telephone Encounter (Signed)
Pt never returned call

## 2016-02-27 NOTE — Telephone Encounter (Signed)
      She needs egd in 1-2 weeks, lec with dilation for esophageal stricture.

## 2016-03-06 NOTE — Telephone Encounter (Signed)
I spoke with the pt and she was not available to schedule EGD at this time, she will call back on Monday to set up.

## 2016-03-10 NOTE — Telephone Encounter (Signed)
The pt wants to call back to set up procedure.  She is not ready to schedule.

## 2016-03-14 ENCOUNTER — Ambulatory Visit (AMBULATORY_SURGERY_CENTER): Payer: Self-pay

## 2016-03-14 VITALS — Ht 62.0 in | Wt 177.0 lb

## 2016-03-14 DIAGNOSIS — K222 Esophageal obstruction: Secondary | ICD-10-CM

## 2016-03-14 NOTE — Progress Notes (Signed)
No egg or soy allergies Not on home 02 No previous anesthesia complications No diet or weight loss meds 

## 2016-03-20 DIAGNOSIS — J309 Allergic rhinitis, unspecified: Secondary | ICD-10-CM | POA: Diagnosis not present

## 2016-03-24 ENCOUNTER — Encounter: Payer: Self-pay | Admitting: Gastroenterology

## 2016-03-24 ENCOUNTER — Ambulatory Visit (AMBULATORY_SURGERY_CENTER): Payer: PPO | Admitting: Gastroenterology

## 2016-03-24 VITALS — BP 180/98 | HR 63 | Temp 98.6°F | Resp 15 | Ht 62.0 in | Wt 177.0 lb

## 2016-03-24 DIAGNOSIS — K222 Esophageal obstruction: Secondary | ICD-10-CM

## 2016-03-24 DIAGNOSIS — I10 Essential (primary) hypertension: Secondary | ICD-10-CM | POA: Diagnosis not present

## 2016-03-24 DIAGNOSIS — K219 Gastro-esophageal reflux disease without esophagitis: Secondary | ICD-10-CM | POA: Diagnosis not present

## 2016-03-24 MED ORDER — SODIUM CHLORIDE 0.9 % IV SOLN: 500.0000 mL | INTRAVENOUS | Status: DC

## 2016-03-24 NOTE — Progress Notes (Addendum)
Patient states that she is not taking her prescribed blood pressure medication prescribed by her primary care physician.  She states that she is just taking herbal remedies.  Her bp is 164/110.  She states that she has been taking a lot of antihistamines lately, and amoxicillin for the sinus infection.  She also takes ginger root.   I explained to the patient that she needs to take her bp medication every day, and that her bp is very high.  Patient cannot remember the name of the bp medication prescribed to her.  Again, she did not fill the prescription.  She states a slight headache at the l back of her head today.  States the pain #1.

## 2016-03-24 NOTE — Progress Notes (Signed)
To PACU  Awake and alert.  Report to RN 

## 2016-03-24 NOTE — Patient Instructions (Signed)
YOU HAD AN ENDOSCOPIC PROCEDURE TODAY AT [facility]:   Refer to the procedure report that was given to you for any specific questions about what was found during the examination.  If the procedure report does not answer your questions, please call your gastroenterologist to clarify.  If you requested that your care partner not be given the details of your procedure findings, then the procedure report has been included in a sealed envelope for you to review at your convenience later.  YOU SHOULD EXPECT: Some feelings of bloating in the abdomen. Passage of more gas than usual.  Walking can help get rid of the air that was put into your GI tract during the procedure and reduce the bloating. If you had a lower endoscopy (such as a colonoscopy or flexible sigmoidoscopy) you may notice spotting of blood in your stool or on the toilet paper. If you underwent a bowel prep for your procedure, you may not have a normal bowel movement for a few days.  Please Note:  You might notice some irritation and congestion in your nose or some drainage.  This is from the oxygen used during your procedure.  There is no need for concern and it should clear up in a day or so.  SYMPTOMS TO REPORT IMMEDIATELY:   Following upper endoscopy (EGD)  Vomiting of blood or coffee ground material  New chest pain or pain under the shoulder blades  Painful or persistently difficult swallowing  New shortness of breath  Fever of 100F or higher  Black, tarry-looking stools  For urgent or emergent issues, a gastroenterologist can be reached at any hour by calling [phone].   DIET: See dilation diet-  Drink plenty of fluids but you should avoid alcoholic beverages for 24 hours.  ACTIVITY:  You should plan to take it easy for the rest of today and you should NOT DRIVE or use heavy machinery until tomorrow (because of the sedation medicines used during the test).    FOLLOW UP: Our staff will call the number  listed on your records the next business day following your procedure to check on you and address any questions or concerns that you may have regarding the information given to you following your procedure. If we do not reach you, we will leave a message.  However, if you are feeling well and you are not experiencing any problems, there is no need to return our call.  We will assume that you have returned to your regular daily activities without incident.  If any biopsies were taken you will be contacted by phone or by letter within the next 1-3 weeks.  Please call us at [phone] if you have not heard about the biopsies in 3 weeks.    SIGNATURES/CONFIDENTIALITY: You and/or your care partner have signed paperwork which will be entered into your electronic medical record.  These signatures attest to the fact that that the information above on your After Visit Summary has been reviewed and is understood.  Full responsibility of the confidentiality of this discharge information lies with you and/or your care-partner.  Hiatal hernia, dilation diet-handouts given  Repeat dilation in 3-4 weeks.

## 2016-03-24 NOTE — Progress Notes (Signed)
Pt is aware of BP being eleveated, she is suppose to take BP meds but is not, she will take some once she gets home and follow-up with her primary care doctor.

## 2016-03-24 NOTE — Op Note (Signed)
[place] Patient Name: Melanie Nash Procedure Date: 03/24/2016 9:17 AM MRN: [id number] Endoscopist: Milus Banister , MD Age: 70 Referring MD:  Date of Birth: 05-17-1946 Gender: Female Procedure:                Upper GI endoscopy Indications:              Dysphagia (esophageal food impaction treated with                            EGD last month, Dr. Ardis Hughs; history of GE junction                            stricture, dilated Dr. Olevia Perches 2015) Medicines:                Monitored Anesthesia Care Procedure:                Pre-Anesthesia Assessment:                           - Prior to the procedure, a History and Physical                            was performed, and patient medications and                            allergies were reviewed. The patient's tolerance of                            previous anesthesia was also reviewed. The risks                            and benefits of the procedure and the sedation                            options and risks were discussed with the patient.                            All questions were answered, and informed consent                            was obtained. Prior Anticoagulants: The patient has                            taken no previous anticoagulant or antiplatelet                            agents. ASA Grade Assessment: II - A patient with                            mild systemic disease. After reviewing the risks                            and benefits, the patient was deemed in  satisfactory condition to undergo the procedure.                           After obtaining informed consent, the endoscope was                            passed under direct vision. Throughout the                            procedure, the patient's blood pressure, pulse, and                            oxygen saturations were monitored continuously. The                            Model GIF-HQ190 [id number]) scope was  introduced                            through the mouth, and advanced to the second part                            of duodenum. The upper GI endoscopy was                            accomplished without difficulty. The patient                            tolerated the procedure well. Scope In: Scope Out: Findings:      One moderate benign-appearing, intrinsic stenosis was found at the       gastroesophageal junction. And was traversed. A TTS dilator was passed       through the scope. Dilation with a 13.5-14.5-15.5 mm balloon dilator was       performed to 15.5 mm. The dilation site was examined and showed typical       minor self limited oozing of blood. Estimated blood loss: minimal.      A small hiatal hernia was present.      The exam was otherwise without abnormality. Complications:            No immediate complications. Estimated Blood Loss:     Estimated blood loss: none. Impression:               - Benign-appearing esophageal stenosis. Dilated.                           - Small hiatal hernia.                           - The examination was otherwise normal.                           - No specimens collected. Recommendation:           - Patient has a contact number available for                            emergencies. The  signs and symptoms of potential                            delayed complications were discussed with the                            patient. Return to normal activities tomorrow.                            Written discharge instructions were provided to the                            patient.                           - Resume previous diet.                           - Continue present medications.                           - Repeat upper endoscopy in 3-4 weeks for                            retreatment. Procedure Code(s):        --- Professional ---                           [phone], Esophagogastroduodenoscopy, flexible,                            transoral;  with transendoscopic balloon dilation of                            esophagus (less than 30 mm diameter) CPT copyright 2016 American [employer] Association. All rights reserved. Milus Banister, MD 03/24/2016 9:38:03 AM This report has been signed electronically. Number of Addenda: 0 Referring MD:      Cherly Beach, MD

## 2016-03-24 NOTE — Progress Notes (Signed)
Called to room to assist during endoscopic procedure.  Patient ID and intended procedure confirmed with present staff. Received instructions for my participation in the procedure from the performing physician.  

## 2016-03-25 ENCOUNTER — Telehealth: Payer: Self-pay

## 2016-03-25 NOTE — Telephone Encounter (Signed)
  Follow up Call-  Call back number 03/24/2016 05/10/2014  Post procedure Call Back phone  # [phone] home  [phone] cell  Permission to leave phone message Yes Yes     Patient questions:  Do you have a fever, pain , or abdominal swelling? No. Pain Score  0 *  Have you tolerated food without any problems? Yes.    Have you been able to return to your normal activities? Yes.    Do you have any questions about your discharge instructions: Diet   No. Medications  No. Follow up visit  No.  Do you have questions or concerns about your Care? No.  Actions: * If pain score is 4 or above: No action needed, pain <4.

## 2016-03-31 ENCOUNTER — Ambulatory Visit (AMBULATORY_SURGERY_CENTER): Payer: Self-pay

## 2016-03-31 VITALS — Ht 62.0 in | Wt 175.0 lb

## 2016-03-31 DIAGNOSIS — T18128D Food in esophagus causing other injury, subsequent encounter: Secondary | ICD-10-CM

## 2016-03-31 DIAGNOSIS — W44F3XD Food entering into or through a natural orifice, subsequent encounter: Secondary | ICD-10-CM

## 2016-03-31 NOTE — Progress Notes (Signed)
No allergies to eggs or soy No diet/weight loss meds No past problems with anesthesia No home oxygen  Refused emmi 

## 2016-04-21 ENCOUNTER — Ambulatory Visit (AMBULATORY_SURGERY_CENTER): Payer: PPO | Admitting: Gastroenterology

## 2016-04-21 ENCOUNTER — Encounter: Payer: Self-pay | Admitting: Gastroenterology

## 2016-04-21 VITALS — BP 179/92 | HR 77 | Temp 97.5°F | Resp 14 | Ht 62.0 in | Wt 175.0 lb

## 2016-04-21 DIAGNOSIS — K222 Esophageal obstruction: Secondary | ICD-10-CM

## 2016-04-21 DIAGNOSIS — M797 Fibromyalgia: Secondary | ICD-10-CM | POA: Diagnosis not present

## 2016-04-21 DIAGNOSIS — K219 Gastro-esophageal reflux disease without esophagitis: Secondary | ICD-10-CM | POA: Diagnosis not present

## 2016-04-21 MED ORDER — SODIUM CHLORIDE 0.9 % IV SOLN: 500.0000 mL | INTRAVENOUS | Status: DC

## 2016-04-21 NOTE — Progress Notes (Signed)
Called to room to assist during endoscopic procedure.  Patient ID and intended procedure confirmed with present staff. Received instructions for my participation in the procedure from the performing physician.  

## 2016-04-21 NOTE — Patient Instructions (Signed)
Discharge instructions given. Handouts on a dilatation diet and a hiatal hernia. Resume previous medications. YOU HAD AN ENDOSCOPIC PROCEDURE TODAY AT [facility]:   Refer to the procedure report that was given to you for any specific questions about what was found during the examination.  If the procedure report does not answer your questions, please call your gastroenterologist to clarify.  If you requested that your care partner not be given the details of your procedure findings, then the procedure report has been included in a sealed envelope for you to review at your convenience later.  YOU SHOULD EXPECT: Some feelings of bloating in the abdomen. Passage of more gas than usual.  Walking can help get rid of the air that was put into your GI tract during the procedure and reduce the bloating. If you had a lower endoscopy (such as a colonoscopy or flexible sigmoidoscopy) you may notice spotting of blood in your stool or on the toilet paper. If you underwent a bowel prep for your procedure, you may not have a normal bowel movement for a few days.  Please Note:  You might notice some irritation and congestion in your nose or some drainage.  This is from the oxygen used during your procedure.  There is no need for concern and it should clear up in a day or so.  SYMPTOMS TO REPORT IMMEDIATELY:    Following upper endoscopy (EGD)  Vomiting of blood or coffee ground material  New chest pain or pain under the shoulder blades  Painful or persistently difficult swallowing  New shortness of breath  Fever of 100F or higher  Black, tarry-looking stools  For urgent or emergent issues, a gastroenterologist can be reached at any hour by calling [phone].   DIET: Your first meal following the procedure should be a small meal and then it is ok to progress to your normal diet. Heavy or fried foods are harder to digest and may make you feel nauseous or bloated.  Likewise, meals heavy  in dairy and vegetables can increase bloating.  Drink plenty of fluids but you should avoid alcoholic beverages for 24 hours.  ACTIVITY:  You should plan to take it easy for the rest of today and you should NOT DRIVE or use heavy machinery until tomorrow (because of the sedation medicines used during the test).    FOLLOW UP: Our staff will call the number listed on your records the next business day following your procedure to check on you and address any questions or concerns that you may have regarding the information given to you following your procedure. If we do not reach you, we will leave a message.  However, if you are feeling well and you are not experiencing any problems, there is no need to return our call.  We will assume that you have returned to your regular daily activities without incident.  If any biopsies were taken you will be contacted by phone or by letter within the next 1-3 weeks.  Please call us at [phone] if you have not heard about the biopsies in 3 weeks.    SIGNATURES/CONFIDENTIALITY: You and/or your care partner have signed paperwork which will be entered into your electronic medical record.  These signatures attest to the fact that that the information above on your After Visit Summary has been reviewed and is understood.  Full responsibility of the confidentiality of this discharge information lies with you and/or your care-partner.

## 2016-04-21 NOTE — Op Note (Signed)
[place] Patient Name: Melanie Nash Procedure Date: 04/21/2016 3:03 PM MRN: [id number] Endoscopist: Milus Banister , MD Age: 70 Date of Birth: Jun 30, 1946 Gender: Female Procedure:                Upper GI endoscopy Indications:              Dysphagia, Stenosis of the GI tract Dysphagia                            (esophageal food impaction treatment with EGD                            01/2016, Dr. Ardis Hughs; histor of GE junction                            stricture, dilated Dr. Olevia Perches 2015); EGD Dr. Ardis Hughs                            02/2016 EGD with dilation to 15.60mm Medicines:                Monitored Anesthesia Care Procedure:                Pre-Anesthesia Assessment:                           - Prior to the procedure, a History and Physical                            was performed, and patient medications and                            allergies were reviewed. The patient's tolerance of                            previous anesthesia was also reviewed. The risks                            and benefits of the procedure and the sedation                            options and risks were discussed with the patient.                            All questions were answered, and informed consent                            was obtained. Prior Anticoagulants: The patient has                            taken no previous anticoagulant or antiplatelet                            agents. ASA Grade Assessment: II - A patient with  mild systemic disease. After reviewing the risks                            and benefits, the patient was deemed in                            satisfactory condition to undergo the procedure.                           After obtaining informed consent, the endoscope was                            passed under direct vision. Throughout the                            procedure, the patient's blood pressure, pulse, and     oxygen saturations were monitored continuously. The                            Model GIF-HQ190 [id number]) scope was introduced                            through the mouth, and advanced to the second part                            of duodenum. The upper GI endoscopy was                            accomplished without difficulty. The patient                            tolerated the procedure well. Scope In: Scope Out: Findings:                 One mild benign-appearing, intrinsic stenosis was                            found at the gastroesophageal junction. This                            measured 1.4 cm (inner diameter) and was traversed.                            A TTS dilator was passed through the scope.                            Dilation with an 18-19-20 mm balloon dilator was                            performed to 20 mm. Following dilation there was                            the typical superficial mucosal tear and self  limited oozing of blood.                           A medium-sized hiatal hernia was present.                           The exam was otherwise without abnormality. Complications:            No immediate complications. Estimated blood loss:                            Minimal. Estimated Blood Loss:     Estimated blood loss was minimal. Impression:               - Benign-appearing esophageal stenosis. Dilated.                           - Medium-sized hiatal hernia.                           - The examination was otherwise normal.                           - No specimens collected. Recommendation:           - Patient has a contact number available for                            emergencies. The signs and symptoms of potential                            delayed complications were discussed with the                            patient. Return to normal activities tomorrow.                            Written discharge instructions were  provided to the                            patient.                           - Resume previous diet taking care to eat slowly,                            take small bites and chew your food well.                           - Continue present medications.                           - Repeat upper endoscopy PRN for retreatment. Milus Banister, MD 04/21/2016 3:21:43 PM This report has been signed electronically.

## 2016-04-21 NOTE — Progress Notes (Signed)
Pt has been taking a blood pressure medication fairly regularly but cannot remember the name.  Blood pressure was 179/100 at this visit.

## 2016-04-21 NOTE — Progress Notes (Signed)
Report to PACU, RN, vss, BBS= Clear.  

## 2016-04-22 ENCOUNTER — Telehealth: Payer: Self-pay | Admitting: *Deleted

## 2016-04-22 NOTE — Telephone Encounter (Signed)
  Follow up Call-  Call back number 04/21/2016 03/24/2016 05/10/2014  Post procedure Call Back phone  # [phone] home  [phone] cell  Permission to leave phone message Yes Yes Yes     Patient questions:  Do you have a fever, pain , or abdominal swelling? No. Pain Score  0 *  Have you tolerated food without any problems? Yes.    Have you been able to return to your normal activities? Yes.    Do you have any questions about your discharge instructions: Diet   No. Medications  No. Follow up visit  No.  Do you have questions or concerns about your Care? No.  Actions: * If pain score is 4 or above: No action needed, pain <4.

## 2016-05-11 DIAGNOSIS — R110 Nausea: Secondary | ICD-10-CM | POA: Diagnosis not present

## 2016-05-11 DIAGNOSIS — R030 Elevated blood-pressure reading, without diagnosis of hypertension: Secondary | ICD-10-CM | POA: Diagnosis not present

## 2016-05-12 ENCOUNTER — Encounter (HOSPITAL_COMMUNITY): Payer: Self-pay

## 2016-05-12 ENCOUNTER — Emergency Department (HOSPITAL_COMMUNITY)
Admission: EM | Admit: 2016-05-12 | Discharge: 2016-05-12 | Disposition: A | Discharge status: Home / Self Care | Payer: PPO | Attending: Emergency Medicine | Admitting: Emergency Medicine

## 2016-05-12 DIAGNOSIS — M436 Torticollis: Secondary | ICD-10-CM | POA: Diagnosis not present

## 2016-05-12 DIAGNOSIS — K219 Gastro-esophageal reflux disease without esophagitis: Secondary | ICD-10-CM | POA: Insufficient documentation

## 2016-05-12 DIAGNOSIS — Z79899 Other long term (current) drug therapy: Secondary | ICD-10-CM | POA: Insufficient documentation

## 2016-05-12 DIAGNOSIS — R51 Headache: Secondary | ICD-10-CM | POA: Diagnosis not present

## 2016-05-12 DIAGNOSIS — I10 Essential (primary) hypertension: Secondary | ICD-10-CM | POA: Diagnosis not present

## 2016-05-12 MED ORDER — IBUPROFEN 400 MG PO TABS: 400.0000 mg | ORAL_TABLET | Freq: Four times a day (QID) | ORAL | Status: DC | PRN

## 2016-05-12 NOTE — ED Notes (Signed)
Pt departed in NAD.  

## 2016-05-12 NOTE — Discharge Instructions (Signed)
Hypertension Hypertension, commonly called high blood pressure, is when the force of blood pumping through your arteries is too strong. Your arteries are the blood vessels that carry blood from your heart throughout your body. A blood pressure reading consists of a higher number over a lower number, such as 110/72. The higher number (systolic) is the pressure inside your arteries when your heart pumps. The lower number (diastolic) is the pressure inside your arteries when your heart relaxes. Ideally you want your blood pressure below 120/80. Hypertension forces your heart to work harder to pump blood. Your arteries may become narrow or stiff. Having untreated or uncontrolled hypertension can cause heart attack, stroke, kidney disease, and other problems. RISK FACTORS Some risk factors for high blood pressure are controllable. Others are not.  Risk factors you cannot control include:   Race. You may be at higher risk if you are African American.  Age. Risk increases with age.  Gender. Men are at higher risk than women before age 45 years. After age 65, women are at higher risk than men. Risk factors you can control include:  Not getting enough exercise or physical activity.  Being overweight.  Getting too much fat, sugar, calories, or salt in your diet.  Drinking too much alcohol. SIGNS AND SYMPTOMS Hypertension does not usually cause signs or symptoms. Extremely high blood pressure (hypertensive crisis) may cause headache, anxiety, shortness of breath, and nosebleed. DIAGNOSIS To check if you have hypertension, your health care provider will measure your blood pressure while you are seated, with your arm held at the level of your heart. It should be measured at least twice using the same arm. Certain conditions can cause a difference in blood pressure between your right and left arms. A blood pressure reading that is higher than normal on one occasion does not mean that you need treatment. If  it is not clear whether you have high blood pressure, you may be asked to return on a different day to have your blood pressure checked again. Or, you may be asked to monitor your blood pressure at home for 1 or more weeks. TREATMENT Treating high blood pressure includes making lifestyle changes and possibly taking medicine. Living a healthy lifestyle can help lower high blood pressure. You may need to change some of your habits. Lifestyle changes may include:  Following the DASH diet. This diet is high in fruits, vegetables, and whole grains. It is low in salt, red meat, and added sugars.  Keep your sodium intake below 2,300 mg per day.  Getting at least 30-45 minutes of aerobic exercise at least 4 times per week.  Losing weight if necessary.  Not smoking.  Limiting alcoholic beverages.  Learning ways to reduce stress. Your health care provider may prescribe medicine if lifestyle changes are not enough to get your blood pressure under control, and if one of the following is true:  You are 18-59 years of age and your systolic blood pressure is above 140.  You are 60 years of age or older, and your systolic blood pressure is above 150.  Your diastolic blood pressure is above 90.  You have diabetes, and your systolic blood pressure is over 140 or your diastolic blood pressure is over 90.  You have kidney disease and your blood pressure is above 140/90.  You have heart disease and your blood pressure is above 140/90. Your personal target blood pressure may vary depending on your medical conditions, your age, and other factors. HOME CARE INSTRUCTIONS    Have your blood pressure rechecked as directed by your health care provider.   Take medicines only as directed by your health care provider. Follow the directions carefully. Blood pressure medicines must be taken as prescribed. The medicine does not work as well when you skip doses. Skipping doses also puts you at risk for  problems.  Do not smoke.   Monitor your blood pressure at home as directed by your health care provider. SEEK MEDICAL CARE IF:   You think you are having a reaction to medicines taken.  You have recurrent headaches or feel dizzy.  You have swelling in your ankles.  You have trouble with your vision. SEEK IMMEDIATE MEDICAL CARE IF:  You develop a severe headache or confusion.  You have unusual weakness, numbness, or feel faint.  You have severe chest or abdominal pain.  You vomit repeatedly.  You have trouble breathing. MAKE SURE YOU:   Understand these instructions.  Will watch your condition.  Will get help right away if you are not doing well or get worse.   This information is not intended to replace advice given to you by your health care provider. Make sure you discuss any questions you have with your health care provider.   Document Released: 12/15/2005 Document Revised: 05/01/2015 Document Reviewed: 10/07/2013 Elsevier Interactive Patient Education 2016 Elsevier Inc.  

## 2016-05-12 NOTE — ED Notes (Signed)
No neuro symptoms at this time.

## 2016-05-12 NOTE — ED Provider Notes (Signed)
CSN: [id number]     Arrival date & time 05/12/16  0028 History  By signing my name below, I, [name], attest that this documentation has been prepared under the direction and in the presence of Leo Grosser, MD . Electronically Signed: [name], Scribe. 05/12/2016. 4:05 AM.   Chief Complaint  Patient presents with  . Hypertension  . Headache   The history is provided by the patient. No language interpreter was used.   HPI Comments:  Melanie Nash is a 70 y.o. female with PMHx of HTN who presents to the Emergency Department via EMS complaining of hypertensive episode tonight measuring 220/120 with EMS. Pt reports associated headache and neck stiffness tonight; she notes chronic intermittent headaches. She took 1/2 of a blood pressure pill with mild relief; she also took an herbal BP medication which upset her stomach. Pt then took antacid and the remaining 1/2 of her blood pressure pill. Denies feeling unwell.  Past Medical History  Diagnosis Date  . Fibromyalgia   . Seasonal allergies     sinus infection  . Arthritis   . GERD (gastroesophageal reflux disease)   . Diverticulosis   . Hiatal hernia   . Esophageal stricture   . Hypertension     pt not taking prescribed bp med   Past Surgical History  Procedure Laterality Date  . Esophagogastroduodenoscopy      dysphagia  . Esophagogastroduodenoscopy N/A 02/26/2016    Procedure: ESOPHAGOGASTRODUODENOSCOPY (EGD);  Surgeon: Milus Banister, MD;  Location: [facility];  Service: Endoscopy;  Laterality: N/A;   Family History  Problem Relation Age of Onset  . Colon cancer Neg Hx   . Stomach cancer Neg Hx   . Heart disease Mother   . Diabetes Sister   . Diabetes Maternal Uncle    Social History  Substance Use Topics  . Smoking status: Never Smoker   . Smokeless tobacco: Never Used  . Alcohol Use: No   OB History    No data available     Review of Systems  Constitutional:       Hypertension    Musculoskeletal: Positive for neck stiffness.  Neurological: Positive for headaches.  All other systems reviewed and are negative.  Allergies  Review of patient's allergies indicates no known allergies.  Home Medications   Prior to Admission medications   Medication Sig Start Date End Date Taking? Authorizing Provider  omeprazole (PRILOSEC) 20 MG capsule Take 1 capsule (20 mg total) by mouth 2 (two) times daily before a meal. Take this medication at [facility]. Patient taking differently: Take 20 mg by mouth every morning.  02/26/16  Yes Vira Blanco, MD  PRESCRIPTION MEDICATION Take 0.5-1 tablets by mouth daily. Blood pressure   Yes Historical Provider, MD   BP 161/88 mmHg  Pulse 88  Temp(Src) 97.8 F (36.6 C) (Oral)  Resp 12  SpO2 98%   Physical Exam  Constitutional: She is oriented to person, place, and time. She appears well-developed and well-nourished. No distress.  HENT:  Head: Normocephalic.  Eyes: Conjunctivae are normal.  Neck: Neck supple. No tracheal deviation present.  Cardiovascular: Normal rate, regular rhythm and normal heart sounds.   Pulmonary/Chest: Effort normal and breath sounds normal. No respiratory distress.  Abdominal: Soft. She exhibits no distension.  Neurological: She is alert and oriented to person, place, and time. No cranial nerve deficit. GCS eye subscore is 4. GCS verbal subscore is 5. GCS motor subscore is 6.  Skin: Skin is warm and dry.  Psychiatric: She has a normal mood and affect.  Vitals reviewed.   ED Course  Procedures  DIAGNOSTIC STUDIES:  Oxygen Saturation is 95% on RA, adequate by my interpretation.    COORDINATION OF CARE:  3:56 AM Discussed treatment plan with pt at bedside and pt agreed to plan.  Labs Review Labs Reviewed - No data to display  Imaging Review No results found. I have personally reviewed and evaluated these images and lab results as part of my medical decision-making.   EKG Interpretation None      MDM    Final diagnoses:  Chronic hypertension    70 y.o. female presents with Concern for hypertension after she repetitively took her blood pressure at home when she noted it to be elevated while she was having a headache. Her headache is resolved. She is not particularly hypertensive here and after having time to calm down from the distress of arrival she becomes relatively normotensive. She has known history of chronic hypertension for which she is controlled on medication. I explained to her that having high blood pressure is rarely on emergency in the absence of concerning symptoms and if she were to have a headache again she can control it with NSAIDs and take her blood pressure when she is asymptomatic.  I personally performed the services described in this documentation, which was scribed in my presence. The recorded information has been reviewed and is accurate.     Leo Grosser, MD 05/12/16 [phone]

## 2016-05-12 NOTE — ED Notes (Signed)
Per EMS: Pt compaining of high blood pressure = 220/120 initially. Pt also complaining of headache. Diarrhea, nausea and vomiting. CBG = 124. EMS gave 4mg  zofran enroute.

## 2016-05-16 DIAGNOSIS — I10 Essential (primary) hypertension: Secondary | ICD-10-CM | POA: Diagnosis not present

## 2016-05-16 DIAGNOSIS — K219 Gastro-esophageal reflux disease without esophagitis: Secondary | ICD-10-CM | POA: Diagnosis not present

## 2016-06-30 ENCOUNTER — Other Ambulatory Visit: Payer: Self-pay | Admitting: *Deleted

## 2016-06-30 DIAGNOSIS — K222 Esophageal obstruction: Secondary | ICD-10-CM

## 2016-06-30 DIAGNOSIS — K219 Gastro-esophageal reflux disease without esophagitis: Secondary | ICD-10-CM

## 2016-06-30 MED ORDER — OMEPRAZOLE 20 MG PO CPDR: 20.0000 mg | DELAYED_RELEASE_CAPSULE | Freq: Every day | ORAL | Status: DC

## 2016-09-19 DIAGNOSIS — I10 Essential (primary) hypertension: Secondary | ICD-10-CM | POA: Diagnosis not present

## 2017-01-13 DIAGNOSIS — J111 Influenza due to unidentified influenza virus with other respiratory manifestations: Secondary | ICD-10-CM | POA: Diagnosis not present

## 2017-01-25 ENCOUNTER — Encounter (HOSPITAL_COMMUNITY): Payer: Self-pay | Admitting: Emergency Medicine

## 2017-01-25 ENCOUNTER — Emergency Department (HOSPITAL_COMMUNITY): Payer: PPO

## 2017-01-25 ENCOUNTER — Inpatient Hospital Stay (HOSPITAL_COMMUNITY)
Admission: EM | Admit: 2017-01-25 | Discharge: 2017-01-30 | DRG: 392 | Disposition: A | Discharge status: Home / Self Care | Payer: PPO | Attending: Internal Medicine | Admitting: Internal Medicine

## 2017-01-25 ENCOUNTER — Inpatient Hospital Stay (HOSPITAL_COMMUNITY): Payer: PPO

## 2017-01-25 DIAGNOSIS — Z8249 Family history of ischemic heart disease and other diseases of the circulatory system: Secondary | ICD-10-CM

## 2017-01-25 DIAGNOSIS — K7689 Other specified diseases of liver: Secondary | ICD-10-CM | POA: Diagnosis not present

## 2017-01-25 DIAGNOSIS — R197 Diarrhea, unspecified: Secondary | ICD-10-CM | POA: Diagnosis not present

## 2017-01-25 DIAGNOSIS — K5720 Diverticulitis of large intestine with perforation and abscess without bleeding: Secondary | ICD-10-CM | POA: Diagnosis not present

## 2017-01-25 DIAGNOSIS — R109 Unspecified abdominal pain: Secondary | ICD-10-CM | POA: Diagnosis not present

## 2017-01-25 DIAGNOSIS — E876 Hypokalemia: Secondary | ICD-10-CM | POA: Diagnosis not present

## 2017-01-25 DIAGNOSIS — N83202 Unspecified ovarian cyst, left side: Secondary | ICD-10-CM | POA: Diagnosis present

## 2017-01-25 DIAGNOSIS — R740 Nonspecific elevation of levels of transaminase and lactic acid dehydrogenase [LDH]: Secondary | ICD-10-CM | POA: Diagnosis present

## 2017-01-25 DIAGNOSIS — K219 Gastro-esophageal reflux disease without esophagitis: Secondary | ICD-10-CM | POA: Diagnosis present

## 2017-01-25 DIAGNOSIS — I10 Essential (primary) hypertension: Secondary | ICD-10-CM | POA: Diagnosis not present

## 2017-01-25 DIAGNOSIS — K5792 Diverticulitis of intestine, part unspecified, without perforation or abscess without bleeding: Secondary | ICD-10-CM | POA: Diagnosis present

## 2017-01-25 DIAGNOSIS — N949 Unspecified condition associated with female genital organs and menstrual cycle: Secondary | ICD-10-CM | POA: Diagnosis not present

## 2017-01-25 DIAGNOSIS — Z833 Family history of diabetes mellitus: Secondary | ICD-10-CM

## 2017-01-25 DIAGNOSIS — K5732 Diverticulitis of large intestine without perforation or abscess without bleeding: Secondary | ICD-10-CM | POA: Diagnosis not present

## 2017-01-25 DIAGNOSIS — E860 Dehydration: Secondary | ICD-10-CM | POA: Diagnosis present

## 2017-01-25 DIAGNOSIS — R945 Abnormal results of liver function studies: Secondary | ICD-10-CM

## 2017-01-25 DIAGNOSIS — D259 Leiomyoma of uterus, unspecified: Secondary | ICD-10-CM | POA: Diagnosis not present

## 2017-01-25 DIAGNOSIS — N9489 Other specified conditions associated with female genital organs and menstrual cycle: Secondary | ICD-10-CM

## 2017-01-25 LAB — COMPREHENSIVE METABOLIC PANEL
ALK PHOS: 82 U/L (ref 38–126)
ALT: 55 U/L — AB (ref 14–54)
AST: 57 U/L — AB (ref 15–41)
Albumin: 3.6 g/dL (ref 3.5–5.0)
Anion gap: 7 (ref 5–15)
BUN: 12 mg/dL (ref 6–20)
CALCIUM: 8.3 mg/dL — AB (ref 8.9–10.3)
CO2: 24 mmol/L (ref 22–32)
CREATININE: 0.82 mg/dL (ref 0.44–1.00)
Chloride: 105 mmol/L (ref 101–111)
GFR calc non Af Amer: >60 mL/min (ref >60)
Glucose, Bld: 139 mg/dL — ABNORMAL HIGH (ref 65–99)
Potassium: 3.8 mmol/L (ref 3.5–5.1)
SODIUM: 136 mmol/L (ref 135–145)
Total Bilirubin: 1.3 mg/dL — ABNORMAL HIGH (ref 0.3–1.2)
Total Protein: 6.2 g/dL — ABNORMAL LOW (ref 6.5–8.1)

## 2017-01-25 LAB — CBC
HCT: 42.4 % (ref 36.0–46.0)
Hemoglobin: 14.3 g/dL (ref 12.0–15.0)
MCH: 29.7 pg (ref 26.0–34.0)
MCHC: 33.7 g/dL (ref 30.0–36.0)
MCV: 88 fL (ref 78.0–100.0)
PLATELETS: 248 10*3/uL (ref 150–400)
RBC: 4.82 MIL/uL (ref 3.87–5.11)
RDW: 13.7 % (ref 11.5–15.5)
WBC: 18 10*3/uL — ABNORMAL HIGH (ref 4.0–10.5)

## 2017-01-25 LAB — LIPASE, BLOOD: Lipase: 18 U/L (ref 11–51)

## 2017-01-25 MED ORDER — SODIUM CHLORIDE 0.9 % IV BOLUS (SEPSIS)
500.0000 mL | Freq: Once | INTRAVENOUS | Status: AC
  Administered 2017-01-25: 500 mL via INTRAVENOUS

## 2017-01-25 MED ORDER — CIPROFLOXACIN IN D5W 400 MG/200ML IV SOLN
400.0000 mg | Freq: Once | INTRAVENOUS | Status: AC
  Administered 2017-01-25: 400 mg via INTRAVENOUS
  Filled 2017-01-25: qty 200

## 2017-01-25 MED ORDER — PIPERACILLIN-TAZOBACTAM 3.375 G IVPB
3.3750 g | Freq: Once | INTRAVENOUS | Status: AC
  Administered 2017-01-26: 3.375 g via INTRAVENOUS
  Filled 2017-01-25: qty 50

## 2017-01-25 MED ORDER — SODIUM CHLORIDE 0.9 % IV SOLN
INTRAVENOUS | Status: DC
  Administered 2017-01-25: 22:00:00 via INTRAVENOUS

## 2017-01-25 MED ORDER — ACETAMINOPHEN 325 MG PO TABS
650.0000 mg | ORAL_TABLET | Freq: Four times a day (QID) | ORAL | Status: DC | PRN
  Administered 2017-01-25: 650 mg via ORAL
  Filled 2017-01-25: qty 2

## 2017-01-25 MED ORDER — ENOXAPARIN SODIUM 40 MG/0.4ML ~~LOC~~ SOLN
40.0000 mg | SUBCUTANEOUS | Status: DC
Start: 2017-01-25 — End: 2017-01-30
  Administered 2017-01-25 – 2017-01-29 (×5): 40 mg via SUBCUTANEOUS
  Filled 2017-01-25 (×5): qty 0.4

## 2017-01-25 MED ORDER — PANTOPRAZOLE SODIUM 40 MG PO TBEC
40.0000 mg | DELAYED_RELEASE_TABLET | Freq: Every day | ORAL | Status: DC
  Administered 2017-01-25: 40 mg via ORAL
  Filled 2017-01-25: qty 1

## 2017-01-25 MED ORDER — SODIUM CHLORIDE 0.9 % IV SOLN
3.0000 g | Freq: Once | INTRAVENOUS | Status: AC
  Administered 2017-01-25: 3 g via INTRAVENOUS
  Filled 2017-01-25: qty 3

## 2017-01-25 MED ORDER — PIPERACILLIN-TAZOBACTAM 3.375 G IVPB
3.3750 g | Freq: Three times a day (TID) | INTRAVENOUS | Status: DC
  Filled 2017-01-25: qty 50

## 2017-01-25 MED ORDER — KETOROLAC TROMETHAMINE 30 MG/ML IJ SOLN
15.0000 mg | Freq: Once | INTRAMUSCULAR | Status: AC
  Administered 2017-01-25: 15 mg via INTRAVENOUS
  Filled 2017-01-25: qty 1

## 2017-01-25 MED ORDER — ACETAMINOPHEN 650 MG RE SUPP: 650.0000 mg | Freq: Four times a day (QID) | RECTAL | Status: DC | PRN

## 2017-01-25 MED ORDER — IOPAMIDOL (ISOVUE-300) INJECTION 61%
INTRAVENOUS | Status: AC
  Administered 2017-01-25: 100 mL via INTRAVENOUS
  Filled 2017-01-25: qty 100

## 2017-01-25 MED ORDER — ONDANSETRON HCL 4 MG/2ML IJ SOLN
4.0000 mg | Freq: Four times a day (QID) | INTRAMUSCULAR | Status: DC | PRN
  Administered 2017-01-25: 4 mg via INTRAVENOUS
  Filled 2017-01-25: qty 2

## 2017-01-25 MED ORDER — MORPHINE SULFATE (PF) 2 MG/ML IV SOLN
0.5000 mg | Freq: Four times a day (QID) | INTRAVENOUS | Status: DC | PRN
  Administered 2017-01-25 – 2017-01-27 (×4): 0.5 mg via INTRAVENOUS
  Filled 2017-01-25 (×4): qty 1

## 2017-01-25 MED ORDER — ONDANSETRON HCL 4 MG/2ML IJ SOLN
4.0000 mg | Freq: Once | INTRAMUSCULAR | Status: AC
  Administered 2017-01-25: 4 mg via INTRAVENOUS
  Filled 2017-01-25: qty 2

## 2017-01-25 NOTE — ED Triage Notes (Signed)
Pt c/o diffuse gassy sharp abdominal pains onset yesterday, improved overnight, returned today, diarrhea, chills. No blood in stool.

## 2017-01-25 NOTE — ED Provider Notes (Signed)
[facility] DEPT Provider Note   CSN: [id number] Arrival date & time: 01/25/17  1334     History   Chief Complaint Chief Complaint  Patient presents with  . Abdominal Pain    HPI Melanie Nash is a 71 y.o. female.  Patient complains of left lower quadrant abdominal pain for 1 day. Some nausea   The history is provided by the patient.  Abdominal Pain   This is a recurrent problem. The current episode started yesterday. The problem occurs constantly. The problem has not changed since onset.The pain is associated with an unknown factor. The pain is located in the LLQ. The pain is at a severity of 5/10. The pain is moderate. Pertinent negatives include anorexia, diarrhea, frequency, hematuria and headaches. Nothing aggravates the symptoms. Nothing relieves the symptoms.    Past Medical History:  Diagnosis Date  . Arthritis   . Diverticulosis   . Esophageal stricture   . Fibromyalgia   . GERD (gastroesophageal reflux disease)   . Hiatal hernia   . Hypertension    pt not taking prescribed bp med  . Seasonal allergies    sinus infection    Patient Active Problem List   Diagnosis Date Noted  . Diverticulitis 01/25/2017  . Abnormal liver function 01/25/2017  . Adnexal mass 01/25/2017    Past Surgical History:  Procedure Laterality Date  . ESOPHAGOGASTRODUODENOSCOPY     dysphagia  . ESOPHAGOGASTRODUODENOSCOPY N/A 02/26/2016   Procedure: ESOPHAGOGASTRODUODENOSCOPY (EGD);  Surgeon: Milus Banister, MD;  Location: [facility];  Service: Endoscopy;  Laterality: N/A;    OB History    No data available       Home Medications    Prior to Admission medications   Medication Sig Start Date End Date Taking? Authorizing Provider  ibuprofen (ADVIL,MOTRIN) 400 MG tablet Take 1 tablet (400 mg total) by mouth every 6 (six) hours as needed for headache. 05/12/16  Yes Leo Grosser, MD  omeprazole (PRILOSEC) 20 MG capsule Take 1 capsule (20 mg total) by mouth at bedtime. Take  this medication at H.S. 06/30/16  Yes Milus Banister, MD  PRESCRIPTION MEDICATION Take 0.5-1 tablets by mouth daily. Blood pressure - losartan   Yes Historical Provider, MD    Family History Family History  Problem Relation Age of Onset  . Heart disease Mother   . Diabetes Sister   . Diabetes Maternal Uncle   . Colon cancer Neg Hx   . Stomach cancer Neg Hx     Social History Social History  Substance Use Topics  . Smoking status: Never Smoker  . Smokeless tobacco: Never Used  . Alcohol use No     Allergies   Patient has no known allergies.   Review of Systems Review of Systems  Constitutional: Negative for appetite change and fatigue.  HENT: Negative for congestion, ear discharge and sinus pressure.   Eyes: Negative for discharge.  Respiratory: Negative for cough.   Cardiovascular: Negative for chest pain.  Gastrointestinal: Positive for abdominal pain. Negative for anorexia and diarrhea.  Genitourinary: Negative for frequency and hematuria.  Musculoskeletal: Negative for back pain.  Skin: Negative for rash.  Neurological: Negative for seizures and headaches.  Psychiatric/Behavioral: Negative for hallucinations.     Physical Exam Updated Vital Signs BP 124/73 (BP Location: Left Arm)   Pulse 99   Temp 98.8 F (37.1 C) (Oral)   Resp 16   SpO2 95%   Physical Exam  Constitutional: She is oriented to person, place, and time. She  appears well-developed.  HENT:  Head: Normocephalic.  Eyes: Conjunctivae and EOM are normal. No scleral icterus.  Neck: Neck supple. No thyromegaly present.  Cardiovascular: Normal rate and regular rhythm.  Exam reveals no gallop and no friction rub.   No murmur heard. Pulmonary/Chest: No stridor. She has no wheezes. She has no rales. She exhibits no tenderness.  Abdominal: She exhibits no distension. There is tenderness. There is no rebound.  Tender left lower quadrant  Musculoskeletal: Normal range of motion. She exhibits no edema.    Lymphadenopathy:    She has no cervical adenopathy.  Neurological: She is oriented to person, place, and time. She exhibits normal muscle tone. Coordination normal.  Skin: No rash noted. No erythema.  Psychiatric: She has a normal mood and affect. Her behavior is normal.     ED Treatments / Results  Labs (all labs ordered are listed, but only abnormal results are displayed) Labs Reviewed  COMPREHENSIVE METABOLIC PANEL - Abnormal; Notable for the following:       Result Value   Glucose, Bld 139 (*)    Calcium 8.3 (*)    Total Protein 6.2 (*)    AST 57 (*)    ALT 55 (*)    Total Bilirubin 1.3 (*)    All other components within normal limits  CBC - Abnormal; Notable for the following:    WBC 18.0 (*)    All other components within normal limits  LIPASE, BLOOD  URINALYSIS, ROUTINE W REFLEX MICROSCOPIC    EKG  EKG Interpretation None       Radiology Ct Abdomen Pelvis W Contrast  Result Date: 01/25/2017 CLINICAL DATA:  Patient with sharp abdominal pain.  Diarrhea. EXAM: CT ABDOMEN AND PELVIS WITH CONTRAST TECHNIQUE: Multidetector CT imaging of the abdomen and pelvis was performed using the standard protocol following bolus administration of intravenous contrast. CONTRAST:  159mL ISOVUE-300 IOPAMIDOL (ISOVUE-300) INJECTION 61% COMPARISON:  CT abdomen pelvis 10/06/2009 FINDINGS: Lower chest: Normal heart size. Minimal dependent atelectasis within the lower lobes bilaterally. No pleural effusion. Hepatobiliary: Liver is normal in size and contour. No focal lesion identified. Small amount of stones and sludge within the gallbladder lumen. No intrahepatic or extrahepatic biliary ductal dilatation. Pancreas: Unremarkable Spleen: Unremarkable Adrenals/Urinary Tract: The adrenal glands are normal. Kidneys enhance symmetrically with contrast. Bilateral parapelvic cysts. Urinary bladder is unremarkable. Stomach/Bowel: There is circumferential wall thickening of the descending/sigmoid colon  (image 63; series 2) with surrounding fat stranding. There a few foci of gas within the adjacent colonic mesentery (image 70; series 4). The appendix is normal. No evidence for upstream bowel obstruction. Small hiatal hernia. Normal morphology of the stomach. Vascular/Lymphatic: Normal caliber abdominal aorta. Peripheral calcified atherosclerotic plaque involving the abdominal aorta. No retroperitoneal lymphadenopathy. Reproductive: Probable calcified fibroid along the right aspect of the uterine body. There is a 3.1 cm cystic lesion within the left adnexa. Right adnexa is unremarkable. Other: Small fat containing left inguinal hernia. Musculoskeletal: Lumbar spine degenerative changes. No aggressive or acute appearing osseous lesions. IMPRESSION: Findings most compatible with acute sigmoid colonic diverticulitis with associated micro perforation and small amount of gas within the adjacent colonic mesentery. If not previously performed, recommend correlation with colonoscopy after resolution of the acute symptomatology to exclude the possibility of colonic mass given the appearance. There is a 3.1 cm left adnexal cystic lesion. Recommend further evaluation with pelvic ultrasound. Cholelithiasis. Aortic atherosclerosis. Electronically Signed   By: Lovey Newcomer M.D.   On: 01/25/2017 16:47    Procedures Procedures (  including critical care time)  Medications Ordered in ED Medications  Ampicillin-Sulbactam (UNASYN) 3 g in sodium chloride 0.9 % 100 mL IVPB (3 g Intravenous New Bag/Given 01/25/17 1857)  ciprofloxacin (CIPRO) IVPB 400 mg (not administered)  sodium chloride 0.9 % bolus 500 mL (0 mLs Intravenous Stopped 01/25/17 1803)  ondansetron (ZOFRAN) injection 4 mg (4 mg Intravenous Given 01/25/17 1544)  ketorolac (TORADOL) 30 MG/ML injection 15 mg (15 mg Intravenous Given 01/25/17 1544)  iopamidol (ISOVUE-300) 61 % injection (100 mLs Intravenous Contrast Given 01/25/17 1625)     Initial Impression /  Assessment and Plan / ED Course  I have reviewed the triage vital signs and the nursing notes.  Pertinent labs & imaging results that were available during my care of the patient were reviewed by me and considered in my medical decision making (see chart for details).     Patient with diverticulitis and microperforation. Patient is admitted by medicine and surgery to consult  Final Clinical Impressions(s) / ED Diagnoses   Final diagnoses:  Diverticulitis of large intestine with perforation without bleeding    New Prescriptions New Prescriptions   No medications on file     Milton Ferguson, MD 01/25/17 1910

## 2017-01-25 NOTE — ED Notes (Signed)
Pt to have ultrasound first prior to transfer to Floor Unit.

## 2017-01-25 NOTE — Progress Notes (Signed)
Pharmacy Antibiotic Note  Melanie Nash is a 71 y.o. female admitted on 01/25/2017 with diverticulitis with microperforation.  In the ED, patient received Cipro 400mg  IV x 1 and Unasyn 3gm IV x 1.  Pharmacy has been consulted for Zosyn dosing.  Plan:  Zosyn 3.375gm IV q8h (each dose infused over 4 hrs)  F/u cultures  Height: 5\' 2"  (157.5 cm) Weight: 172 lb 6.4 oz (78.2 kg) IBW/kg (Calculated) : 50.1  Temp (24hrs), Avg:98.6 F (37 C), Min:98.3 F (36.8 C), Max:98.8 F (37.1 C)   Recent Labs Lab 01/25/17 1417  WBC 18.0*  CREATININE 0.82    Estimated Creatinine Clearance: 61.8 mL/min (by C-G formula based on SCr of 0.82 mg/dL).    No Known Allergies  Antimicrobials this admission: 1/28 cipro x 1 1/28 unasyn x 1 1/29 zosyn >>  Dose adjustments this admission:    Microbiology results: 1/28 Stool Cx: sent 1/28 CDiff: sent   Thank you for allowing pharmacy to be a part of this patient's care.  Everette Rank, PharmD 01/25/2017 11:53 PM

## 2017-01-25 NOTE — H&P (Signed)
[facility] H&P   Patient Demographics:    Melanie Nash, is a 71 y.o. female  MRN: [id number]   DOB - 1946/10/22  Admit Date - 01/25/2017  Outpatient Primary MD for the patient is [name]., MD  Referring MD/NP/PA: Denton Meek  Outpatient Specialists:   Patient coming from: home  Chief Complaint  Patient presents with  . Abdominal Pain      HPI:    Melanie Nash  is a 71 y.o. female, w diverticulosis presents with c/o bilateral lower abdominal pain x 2 days,  "crampy".  Pt has slight nausea.  Diarrhea, "loose stool" .  5+ times today.  Denies emesis, constipation, brbpr, black stool.  Pt has history of diverticulitis in the past and presented for evaluation.   In ED, pt was found on CT scan to have left adnexal mass 3.1cm, as well as diverticultitis with microperforation and abnormal liver function. Pt received unasyn in ED, Pt will be admitted for diverticultiis    Review of systems:    In addition to the HPI above, No Fever-chills, No Headache, No changes with Vision or hearing, No problems swallowing food or Liquids, No Chest pain, Cough or Shortness of Breath, No Blood in stool or Urine, No dysuria, No new skin rashes or bruises, No new joints pains-aches,  No new weakness, tingling, numbness in any extremity, No recent weight gain or loss, No polyuria, polydypsia or polyphagia, No significant Mental Stressors.  A full 10 point Review of Systems was done, except as stated above, all other Review of Systems were negative.   With Past History of the following :    Past Medical History:  Diagnosis Date  . Arthritis   . Diverticulosis   . Esophageal stricture   . Fibromyalgia   . GERD (gastroesophageal reflux disease)   . Hiatal hernia   . Hypertension    pt not taking prescribed bp med  . Seasonal allergies    sinus infection      Past Surgical  History:  Procedure Laterality Date  . COLONOSCOPY    . ESOPHAGOGASTRODUODENOSCOPY     dysphagia  . ESOPHAGOGASTRODUODENOSCOPY N/A 02/26/2016   Procedure: ESOPHAGOGASTRODUODENOSCOPY (EGD);  Surgeon: Milus Banister, MD;  Location: [facility];  Service: Endoscopy;  Laterality: N/A;      Social History:     Social History  Substance Use Topics  . Smoking status: Never Smoker  . Smokeless tobacco: Never Used  . Alcohol use No     Lives - at home  Mobility - ambulates by self     Family History :     Family History  Problem Relation Age of Onset  . Heart disease Mother   . Diabetes Sister   . Diabetes Maternal Uncle   . AAA (abdominal aortic aneurysm) Father   . Colon cancer Neg Hx   . Stomach cancer Neg Hx  Home Medications:   Prior to Admission medications   Medication Sig Start Date End Date Taking? Authorizing Provider  ibuprofen (ADVIL,MOTRIN) 400 MG tablet Take 1 tablet (400 mg total) by mouth every 6 (six) hours as needed for headache. 05/12/16  Yes Leo Grosser, MD  omeprazole (PRILOSEC) 20 MG capsule Take 1 capsule (20 mg total) by mouth at bedtime. Take this medication at H.S. 06/30/16  Yes Milus Banister, MD  PRESCRIPTION MEDICATION Take 0.5-1 tablets by mouth daily. Blood pressure - losartan   Yes Historical Provider, MD     Allergies:    No Known Allergies   Physical Exam:   Vitals  Blood pressure 124/73, pulse 99, temperature 98.8 F (37.1 C), temperature source Oral, resp. rate 16, SpO2 95 %.   1. General  lying in bed in NAD,   2. Normal affect and insight, Not Suicidal or Homicidal, Awake Alert, Oriented X 3.  3. No F.N deficits, ALL C.Nerves Intact, Strength 5/5 all 4 extremities, Sensation intact all 4 extremities, Plantars down going.  4. Ears and Eyes appear Normal, Conjunctivae clear, PERRLA. Moist Oral Mucosa.  5. Supple Neck, No JVD, No cervical lymphadenopathy appriciated, No Carotid Bruits.  6. Symmetrical Chest wall  movement, Good air movement bilaterally, CTAB.  7. RRR, No Gallops, Rubs or Murmurs, No Parasternal Heave.  8. Positive Bowel Sounds, Abdomen Soft, No tenderness, No organomegaly appriciated,No rebound -guarding or rigidity.  9.  No Cyanosis, Normal Skin Turgor, No Skin Rash or Bruise.  10. Good muscle tone,  joints appear normal , no effusions, Normal ROM.  11. No Palpable Lymph Nodes in Neck or Axillae    Data Review:    CBC  Recent Labs Lab 01/25/17 1417  WBC 18.0*  HGB 14.3  HCT 42.4  PLT 248  MCV 88.0  MCH 29.7  MCHC 33.7  RDW 13.7   ------------------------------------------------------------------------------------------------------------------  Chemistries   Recent Labs Lab 01/25/17 1417  NA 136  K 3.8  CL 105  CO2 24  GLUCOSE 139*  BUN 12  CREATININE 0.82  CALCIUM 8.3*  AST 57*  ALT 55*  ALKPHOS 82  BILITOT 1.3*   ------------------------------------------------------------------------------------------------------------------ CrCl cannot be calculated (Unknown ideal weight.). ------------------------------------------------------------------------------------------------------------------ No results for input(s): TSH, T4TOTAL, T3FREE, THYROIDAB in the last 72 hours.  Invalid input(s): FREET3  Coagulation profile No results for input(s): INR, PROTIME in the last 168 hours. ------------------------------------------------------------------------------------------------------------------- No results for input(s): DDIMER in the last 72 hours. -------------------------------------------------------------------------------------------------------------------  Cardiac Enzymes No results for input(s): CKMB, TROPONINI, MYOGLOBIN in the last 168 hours.  Invalid input(s): CK ------------------------------------------------------------------------------------------------------------------ No results found for:  BNP   ---------------------------------------------------------------------------------------------------------------  Urinalysis    Component Value Date/Time   COLORURINE YELLOW 10/06/2009 0001   APPEARANCEUR CLEAR 10/06/2009 0001   LABSPEC 1.011 10/06/2009 0001   PHURINE 8.0 10/06/2009 0001   GLUCOSEU NEGATIVE 10/06/2009 0001   HGBUR SMALL (A) 10/06/2009 0001   BILIRUBINUR NEGATIVE 10/06/2009 0001   KETONESUR 40 (A) 10/06/2009 0001   PROTEINUR NEGATIVE 10/06/2009 0001   UROBILINOGEN 0.2 10/06/2009 0001   NITRITE NEGATIVE 10/06/2009 0001   LEUKOCYTESUR NEGATIVE 10/06/2009 0001    ----------------------------------------------------------------------------------------------------------------   Imaging Results:    Ct Abdomen Pelvis W Contrast  Result Date: 01/25/2017 CLINICAL DATA:  Patient with sharp abdominal pain.  Diarrhea. EXAM: CT ABDOMEN AND PELVIS WITH CONTRAST TECHNIQUE: Multidetector CT imaging of the abdomen and pelvis was performed using the standard protocol following bolus administration of intravenous contrast. CONTRAST:  151mL ISOVUE-300 IOPAMIDOL (ISOVUE-300) INJECTION 61% COMPARISON:  CT abdomen pelvis 10/06/2009 FINDINGS: Lower chest: Normal heart size. Minimal dependent atelectasis within the lower lobes bilaterally. No pleural effusion. Hepatobiliary: Liver is normal in size and contour. No focal lesion identified. Small amount of stones and sludge within the gallbladder lumen. No intrahepatic or extrahepatic biliary ductal dilatation. Pancreas: Unremarkable Spleen: Unremarkable Adrenals/Urinary Tract: The adrenal glands are normal. Kidneys enhance symmetrically with contrast. Bilateral parapelvic cysts. Urinary bladder is unremarkable. Stomach/Bowel: There is circumferential wall thickening of the descending/sigmoid colon (image 63; series 2) with surrounding fat stranding. There a few foci of gas within the adjacent colonic mesentery (image 70; series 4). The  appendix is normal. No evidence for upstream bowel obstruction. Small hiatal hernia. Normal morphology of the stomach. Vascular/Lymphatic: Normal caliber abdominal aorta. Peripheral calcified atherosclerotic plaque involving the abdominal aorta. No retroperitoneal lymphadenopathy. Reproductive: Probable calcified fibroid along the right aspect of the uterine body. There is a 3.1 cm cystic lesion within the left adnexa. Right adnexa is unremarkable. Other: Small fat containing left inguinal hernia. Musculoskeletal: Lumbar spine degenerative changes. No aggressive or acute appearing osseous lesions. IMPRESSION: Findings most compatible with acute sigmoid colonic diverticulitis with associated micro perforation and small amount of gas within the adjacent colonic mesentery. If not previously performed, recommend correlation with colonoscopy after resolution of the acute symptomatology to exclude the possibility of colonic mass given the appearance. There is a 3.1 cm left adnexal cystic lesion. Recommend further evaluation with pelvic ultrasound. Cholelithiasis. Aortic atherosclerosis. Electronically Signed   By: Lovey Newcomer M.D.   On: 01/25/2017 16:47      Assessment & Plan:    Active Problems:   Diverticulitis   Abnormal liver function   Adnexal mass    1. Abdominal pain secondary to diverticulitis with microperforation NPO Hydrate with ns iv Surgery consulted by ED Zosyn iv pharmacy to dose  2. L adnexal mass Transvaginal ultrasound  3. Abnormal lft Check acute hepatitis panel  4. Diarrhea Stool studies  5. Nausea zofran prn  DVT Prophylaxis Lovenox - SCDs  AM Labs Ordered, also please review Full Orders  Family Communication: Admission, patients condition and plan of care including tests being ordered have been discussed with the patient who indicate understanding and agree with the plan and Code Status.  Code Status FULL CODE  Likely DC to  home  Condition GUARDED     Consults called: surgery by ED  Admission status: inpatient  Time spent in minutes :  45 minutes   Jani Gravel M.D on 01/25/2017 at 7:11 PM  Between 7am to 7pm - Pager - [phone]. After 7pm go to [email or web address] - password [email or web address]  [facility] - Office  [phone]

## 2017-01-25 NOTE — ED Notes (Signed)
Pt back from ultrasound.

## 2017-01-26 LAB — URINALYSIS, ROUTINE W REFLEX MICROSCOPIC
Bacteria, UA: NONE SEEN
Bilirubin Urine: NEGATIVE
Glucose, UA: NEGATIVE mg/dL
Ketones, ur: 5 mg/dL — AB
Nitrite: NEGATIVE
Protein, ur: NEGATIVE mg/dL
Specific Gravity, Urine: 1.041 — ABNORMAL HIGH (ref 1.005–1.030)
pH: 6 (ref 5.0–8.0)

## 2017-01-26 LAB — COMPREHENSIVE METABOLIC PANEL
ALT: 32 U/L (ref 14–54)
AST: 27 U/L (ref 15–41)
Albumin: 2.6 g/dL — ABNORMAL LOW (ref 3.5–5.0)
Alkaline Phosphatase: 61 U/L (ref 38–126)
Anion gap: 6 (ref 5–15)
BILIRUBIN TOTAL: 1.4 mg/dL — AB (ref 0.3–1.2)
BUN: 13 mg/dL (ref 6–20)
CO2: 24 mmol/L (ref 22–32)
CREATININE: 0.94 mg/dL (ref 0.44–1.00)
Calcium: 7.2 mg/dL — ABNORMAL LOW (ref 8.9–10.3)
Chloride: 110 mmol/L (ref 101–111)
Glucose, Bld: 110 mg/dL — ABNORMAL HIGH (ref 65–99)
POTASSIUM: 3.4 mmol/L — AB (ref 3.5–5.1)
Sodium: 140 mmol/L (ref 135–145)
TOTAL PROTEIN: 5.3 g/dL — AB (ref 6.5–8.1)

## 2017-01-26 LAB — CBC
HCT: 34.6 % — ABNORMAL LOW (ref 36.0–46.0)
Hemoglobin: 11.7 g/dL — ABNORMAL LOW (ref 12.0–15.0)
MCH: 30.1 pg (ref 26.0–34.0)
MCHC: 33.8 g/dL (ref 30.0–36.0)
MCV: 88.9 fL (ref 78.0–100.0)
PLATELETS: 197 10*3/uL (ref 150–400)
RBC: 3.89 MIL/uL (ref 3.87–5.11)
RDW: 14 % (ref 11.5–15.5)
WBC: 13.7 10*3/uL — AB (ref 4.0–10.5)

## 2017-01-26 MED ORDER — SIMETHICONE 40 MG/0.6ML PO SUSP
80.0000 mg | Freq: Four times a day (QID) | ORAL | Status: DC | PRN
  Administered 2017-01-27 – 2017-01-28 (×2): 80 mg via ORAL
  Filled 2017-01-26 (×3): qty 1.2

## 2017-01-26 MED ORDER — KCL IN DEXTROSE-NACL 40-5-0.9 MEQ/L-%-% IV SOLN
INTRAVENOUS | Status: DC
  Administered 2017-01-26 – 2017-01-29 (×7): via INTRAVENOUS
  Filled 2017-01-26 (×6): qty 1000

## 2017-01-26 MED ORDER — ACETAMINOPHEN 650 MG RE SUPP: 325.0000 mg | Freq: Four times a day (QID) | RECTAL | Status: DC | PRN

## 2017-01-26 MED ORDER — PANTOPRAZOLE SODIUM 40 MG IV SOLR
40.0000 mg | INTRAVENOUS | Status: DC
  Administered 2017-01-26 – 2017-01-29 (×4): 40 mg via INTRAVENOUS
  Filled 2017-01-26 (×4): qty 40

## 2017-01-26 MED ORDER — ACETAMINOPHEN 500 MG PO TABS
500.0000 mg | ORAL_TABLET | Freq: Four times a day (QID) | ORAL | Status: DC | PRN
  Administered 2017-01-27 – 2017-01-29 (×2): 500 mg via ORAL
  Filled 2017-01-26 (×2): qty 1

## 2017-01-26 MED ORDER — PIPERACILLIN-TAZOBACTAM 3.375 G IVPB
3.3750 g | Freq: Three times a day (TID) | INTRAVENOUS | Status: DC
  Administered 2017-01-26 – 2017-01-28 (×6): 3.375 g via INTRAVENOUS
  Filled 2017-01-26 (×4): qty 50
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
## 2017-01-26 NOTE — Progress Notes (Signed)
[place] TEAM 1 - Stepdown/ICU TEAM  Melanie Nash  [id number] DOB: 1946-05-02 DOA: 01/25/2017 PCP: [name]., MD    Brief Narrative:  71 y.o. female w diverticulosis who presented with c/o crampy bilateral lower abdominal pain x 2 days w/ nausea and loose stools 5+ times.  In the ED CT noted a left adnexal mass 3.1cm, as well as diverticultitis with microperforation.  Subjective: The patient states she feels somewhat better already.  She does report diffuse abdominal crampy-type pain which is about the same as yesterday.  She reports less nausea and no vomiting.  She reports a modest appetite at present.  She denies any pelvic pain or dysfunctional uterine bleeding.  She does report a history of ovarian cysts.  Assessment & Plan:  Abdominal pain secondary to diverticulitis with microperforation NPO - Gen Surgery following - cont empiric Zosyn   L adnexal mass 3.5 cm elongated ovoid fluid structure confirmed in left adnexa via transvaginal ultrasound - will require repeat pelvic ultrasound in 6 weeks or MRI with and without contrast after resolution of her acute diverticulitis - there is concern this may in fact be a neoplasm - I have discussed this at length with the patient and she is currently deciding whether she would prefer an MRI during this hospital stay or outpatient ultrasound follow-up  Mild Transaminitis  Acute hepatitis panel pending - LFTs have normalized - suspect simple low-grade shock liver  Mild hypokalemia Due to poor intake - supplement and follow - check magnesium  DVT prophylaxis:  Code Status: FULL CODE Family Communication: no family present at time of exam  Disposition Plan: Anticipate additional 48 hours of hospitalization will be required - possibly longer   Consultants:  Gen Surgery   Procedures: none  Antimicrobials:  Unasyn 1/28 Ciprofloxacin 1/28 Zosyn 1/28 >  Objective: Blood pressure (!) 103/51, pulse 62, temperature 97 F (36.1  C), temperature source Oral, resp. rate 18, height 5\' 2"  (1.575 m), weight 78.5 kg (173 lb), SpO2 95 %.  Intake/Output Summary (Last 24 hours) at 01/26/17 1140 Last data filed at 01/26/17 1029  Gross per 24 hour  Intake             1290 ml  Output              300 ml  Net              990 ml   Filed Weights   01/25/17 2300 01/26/17 0440  Weight: 78.2 kg (172 lb 6.4 oz) 78.5 kg (173 lb)    Examination: General: No acute respiratory distress Lungs: Clear to auscultation bilaterally without wheezes or crackles Cardiovascular: Regular rate and rhythm without murmur gallop or rub normal S1 and S2 Abdomen: Tender diffusely without rebound, soft, bowel sounds positive, no appreciable mass Extremities: No significant cyanosis, clubbing, or edema bilateral lower extremities  CBC:  Recent Labs Lab 01/25/17 1417 01/26/17 0429  WBC 18.0* 13.7*  HGB 14.3 11.7*  HCT 42.4 34.6*  MCV 88.0 88.9  PLT 248 [id number]   Basic Metabolic Panel:  Recent Labs Lab 01/25/17 1417 01/26/17 0429  NA 136 140  K 3.8 3.4*  CL 105 110  CO2 24 24  GLUCOSE 139* 110*  BUN 12 13  CREATININE 0.82 0.94  CALCIUM 8.3* 7.2*   GFR: Estimated Creatinine Clearance: 54.1 mL/min (by C-G formula based on SCr of 0.94 mg/dL).  Liver Function Tests:  Recent Labs Lab 01/25/17 1417 01/26/17 0429  AST 57* 27  ALT 55* 32  ALKPHOS 82 61  BILITOT 1.3* 1.4*  PROT 6.2* 5.3*  ALBUMIN 3.6 2.6*    Recent Labs Lab 01/25/17 1417  LIPASE 18    Scheduled Meds: . enoxaparin (LOVENOX) injection  40 mg Subcutaneous Q24H  . pantoprazole  40 mg Oral QHS  . piperacillin-tazobactam (ZOSYN)  IV  3.375 g Intravenous Q8H    LOS: 1 day   Cherene Altes, MD [facility] Office  [phone] Pager - Text Page per Amion as per below:  On-Call/Text Page:      [email or web address]      password [email or web address]  If 7PM-7AM, please contact night-coverage [email or web address] Password [email or web address] 01/26/2017, 11:40 AM

## 2017-01-27 DIAGNOSIS — K5720 Diverticulitis of large intestine with perforation and abscess without bleeding: Principal | ICD-10-CM

## 2017-01-27 LAB — COMPREHENSIVE METABOLIC PANEL
ALT: 26 U/L (ref 14–54)
AST: 20 U/L (ref 15–41)
Albumin: 2.5 g/dL — ABNORMAL LOW (ref 3.5–5.0)
Alkaline Phosphatase: 59 U/L (ref 38–126)
Anion gap: 6 (ref 5–15)
BUN: 12 mg/dL (ref 6–20)
CO2: 21 mmol/L — AB (ref 22–32)
Calcium: 7.6 mg/dL — ABNORMAL LOW (ref 8.9–10.3)
Chloride: 114 mmol/L — ABNORMAL HIGH (ref 101–111)
Creatinine, Ser: 0.88 mg/dL (ref 0.44–1.00)
Glucose, Bld: 69 mg/dL (ref 65–99)
POTASSIUM: 3.4 mmol/L — AB (ref 3.5–5.1)
SODIUM: 141 mmol/L (ref 135–145)
Total Bilirubin: 1.4 mg/dL — ABNORMAL HIGH (ref 0.3–1.2)
Total Protein: 5.2 g/dL — ABNORMAL LOW (ref 6.5–8.1)

## 2017-01-27 LAB — CBC
HEMATOCRIT: 34.2 % — AB (ref 36.0–46.0)
Hemoglobin: 11.4 g/dL — ABNORMAL LOW (ref 12.0–15.0)
MCH: 29.9 pg (ref 26.0–34.0)
MCHC: 33.3 g/dL (ref 30.0–36.0)
MCV: 89.8 fL (ref 78.0–100.0)
Platelets: 225 10*3/uL (ref 150–400)
RBC: 3.81 MIL/uL — AB (ref 3.87–5.11)
RDW: 14.1 % (ref 11.5–15.5)
WBC: 8.6 10*3/uL (ref 4.0–10.5)

## 2017-01-27 LAB — MAGNESIUM: MAGNESIUM: 2.1 mg/dL (ref 1.7–2.4)

## 2017-01-27 LAB — HEPATITIS PANEL, ACUTE
Hep A IgM: NEGATIVE
Hep B C IgM: NEGATIVE
Hepatitis B Surface Ag: NEGATIVE

## 2017-01-27 MED ORDER — FAMOTIDINE IN NACL 20-0.9 MG/50ML-% IV SOLN
20.0000 mg | Freq: Two times a day (BID) | INTRAVENOUS | Status: DC
  Filled 2017-01-27 (×2): qty 50

## 2017-01-27 NOTE — Progress Notes (Signed)
S: No acute events. Continues to have some lower abdominal pain.   Vitals, labs, intake/output, and orders reviewed at this time. Afebrile. WBC normalized.  Gen: A&Ox3, no distress  H&N: EOMI, atraumatic, neck supple Chest: unlabored respirations, RRR Abd: soft, mildly tender across lower abdomen, nondistended Ext: warm, no edema Neuro: grossly normal  Lines/tubes/drains: PIV  A/P:  HD 2 with diverticulitis. Afebrile, WBC normalized. Continues to have some pain. Would continue IV abx and bowel rest for today.  Out of bed/ambulate.    Romana Juniper, MD [facility], [place] Pager [phone]

## 2017-01-27 NOTE — Progress Notes (Signed)
SIRAT PIETRZYK  [id number] DOB: [birth year] DOA: 01/25/2017 PCP: [name]., MD    Brief Narrative:  71 y.o. female w diverticulosis who presented with c/o crampy bilateral lower abdominal pain x 2 days w/ nausea and loose stools 5+ times.  In the ED CT noted a left adnexal mass 3.1cm, as well as diverticultitis with microperforation.  Subjective: She report some improvement of abdominal pain. Denies BM. Passing some gas. Pain gets worse with movements.    Assessment & Plan:  1-Abdominal pain secondary to diverticulitis with microperforation NPO - Gen Surgery following -  Cont empiric Zosyn  Add Pepcid IV.  WBC trending down from 18 to 8 today.  Plan to continue with NPO status.  Patient will needs colonoscopy after resolution of diverticulitis, likely out patient.   2-L Adnexal mass; -3.5 cm elongated ovoid fluid structure confirmed in left adnexa via transvaginal ultrasound - will require repeat pelvic ultrasound in 6 weeks or MRI with and without contrast after resolution of her acute diverticulitis - there is concern this may in fact be a neoplasm . -Patient aware of results.  Care discussed with radiology, this could be hydrosalpinx, benign cyst or low grade malignancy. We should wait for diverticulitis to resolved. Could repeat US in 6 weeks, and or MRI in 6 weeks.    Mild Transaminitis  Acute hepatitis panel negative - LFTs have normalized - suspect simple low-grade shock liver  Mild hypokalemia Due to poor intake - on IV KCL on IV fluids.  Mg normal.    DVT prophylaxis: Lovenox.  Code Status: FULL CODE Family Communication: Daughter who was at bedside.  Disposition Plan: Anticipate additional 48 hours of hospitalization will be required - possibly longer. Patient still NPO, on IV antibiotics and IV fluids.    Consultants:  Gen Surgery   Procedures: none  Antimicrobials:  Unasyn 1/28 Ciprofloxacin 1/28 Zosyn 1/28 >  Objective: Blood pressure  118/80, pulse 72, temperature 98 F (36.7 C), temperature source Oral, resp. rate 14, height 5\' 2"  (1.575 m), weight 79.2 kg (174 lb 11.2 oz), SpO2 97 %.  Intake/Output Summary (Last 24 hours) at 01/27/17 1418 Last data filed at 01/27/17 1000  Gross per 24 hour  Intake             1050 ml  Output              800 ml  Net              250 ml   Filed Weights   01/25/17 2300 01/26/17 0440 01/27/17 0152  Weight: 78.2 kg (172 lb 6.4 oz) 78.5 kg (173 lb) 79.2 kg (174 lb 11.2 oz)    Examination: General: No acute respiratory distress Lungs: Clear to auscultation bilaterally without wheezes or crackles Cardiovascular: Regular rate and rhythm without murmur gallop or rub normal S1 and S2 Abdomen: Tender diffusely without rebound, soft, bowel sounds positive, no appreciable mass Extremities: No significant cyanosis, clubbing, or edema bilateral lower extremities  CBC:  Recent Labs Lab 01/25/17 1417 01/26/17 0429 01/27/17 0444  WBC 18.0* 13.7* 8.6  HGB 14.3 11.7* 11.4*  HCT 42.4 34.6* 34.2*  MCV 88.0 88.9 89.8  PLT 248 197 [id number]   Basic Metabolic Panel:  Recent Labs Lab 01/25/17 1417 01/26/17 0429 01/27/17 0444  NA 136 140 141  K 3.8 3.4* 3.4*  CL 105 110 114*  CO2 24 24 21*  GLUCOSE 139* 110* 69  BUN 12 13 12   CREATININE 0.82 0.94  0.88  CALCIUM 8.3* 7.2* 7.6*  MG  --   --  2.1   GFR: Estimated Creatinine Clearance: 57.9 mL/min (by C-G formula based on SCr of 0.88 mg/dL).  Liver Function Tests:  Recent Labs Lab 01/25/17 1417 01/26/17 0429 01/27/17 0444  AST 57* 27 20  ALT 55* 32 26  ALKPHOS 82 61 59  BILITOT 1.3* 1.4* 1.4*  PROT 6.2* 5.3* 5.2*  ALBUMIN 3.6 2.6* 2.5*    Recent Labs Lab 01/25/17 1417  LIPASE 18    Scheduled Meds: . enoxaparin (LOVENOX) injection  40 mg Subcutaneous Q24H  . famotidine (PEPCID) IV  20 mg Intravenous Q12H  . pantoprazole (PROTONIX) IV  40 mg Intravenous Q24H  . piperacillin-tazobactam (ZOSYN)  IV  3.375 g Intravenous Q8H      LOS: 2 days   Niel Hummer, MD.  [facility] [phone]  If 7PM-7AM, please contact night-coverage [email or web address] Password [email or web address] 01/27/2017, 2:18 PM

## 2017-01-27 NOTE — Progress Notes (Signed)
Pt. Encouraged to ambulate in hall, prefers to ambulate in room. Will continue to encourage hall ambulation

## 2017-01-28 LAB — BASIC METABOLIC PANEL
ANION GAP: 5 (ref 5–15)
BUN: 6 mg/dL (ref 6–20)
CO2: 25 mmol/L (ref 22–32)
Calcium: 7.7 mg/dL — ABNORMAL LOW (ref 8.9–10.3)
Chloride: 112 mmol/L — ABNORMAL HIGH (ref 101–111)
Creatinine, Ser: 0.76 mg/dL (ref 0.44–1.00)
GFR calc Af Amer: >60 mL/min (ref >60)
GFR calc non Af Amer: >60 mL/min (ref >60)
GLUCOSE: 116 mg/dL — AB (ref 65–99)
POTASSIUM: 4 mmol/L (ref 3.5–5.1)
Sodium: 142 mmol/L (ref 135–145)

## 2017-01-28 LAB — CBC
HEMATOCRIT: 33.6 % — AB (ref 36.0–46.0)
Hemoglobin: 11.4 g/dL — ABNORMAL LOW (ref 12.0–15.0)
MCH: 29.9 pg (ref 26.0–34.0)
MCHC: 33.9 g/dL (ref 30.0–36.0)
MCV: 88.2 fL (ref 78.0–100.0)
Platelets: 230 10*3/uL (ref 150–400)
RBC: 3.81 MIL/uL — AB (ref 3.87–5.11)
RDW: 13.9 % (ref 11.5–15.5)
WBC: 6.9 10*3/uL (ref 4.0–10.5)

## 2017-01-28 MED ORDER — PIPERACILLIN-TAZOBACTAM 3.375 G IVPB
3.3750 g | Freq: Three times a day (TID) | INTRAVENOUS | Status: DC
  Administered 2017-01-28 – 2017-01-30 (×5): 3.375 g via INTRAVENOUS
  Filled 2017-01-28 (×6): qty 50

## 2017-01-28 NOTE — Progress Notes (Signed)
S: No acute events. Reports abdominal pain is improved.   Vitals, labs, intake/output, and orders reviewed at this time. Afebrile. WBC 6.9.  Gen: A&Ox3, no distress  H&N: EOMI, atraumatic, neck supple Chest: unlabored respirations, RRR Abd: soft, mildly tender across lower abdomen- less than yesterday, nondistended Ext: warm, no edema Neuro: grossly normal  Lines/tubes/drains: PIV  A/P:  HD 3 with diverticulitis w microperf. Afebrile, WBC normal. Pain somewhat improved. Would continue IV abx and will try sips of clears today.  Out of bed/ambulate.    Romana Juniper, MD [facility], [place] Pager [phone]

## 2017-01-28 NOTE — Progress Notes (Signed)
Melanie Nash  [id number] DOB: [birth year] DOA: 01/25/2017 PCP: [name]., MD    Brief Narrative:  71 y.o. female w diverticulosis who presented with c/o crampy bilateral lower abdominal pain x 2 days w/ nausea and loose stools 5+ times.  In the ED CT noted a left adnexal mass 3.1cm, as well as diverticultitis with microperforation.  Subjective: Abdomina pain 2/10, had small bowel movement. She has been NPO.     Assessment & Plan:  1-Abdominal pain secondary to diverticulitis with microperforation Gen Surgery following -  Cont empiric Zosyn  WBC trending down from 18 to 6.9 Patient will needs colonoscopy after resolution of diverticulitis, likely out patient.  Plan to start sips of clears today. Repeat labs in am.    2-L Adnexal mass; -3.5 cm elongated ovoid fluid structure confirmed in left adnexa via transvaginal ultrasound - will require repeat pelvic ultrasound in 6 weeks or MRI with and without contrast after resolution of her acute diverticulitis - there is concern this may in fact be a neoplasm . -Patient aware of results.  -Care discussed with radiology, this could be hydrosalpinx, benign cyst or low grade malignancy. We should wait for diverticulitis to resolved. Could repeat US in 6 weeks, and or MRI in 6 weeks.    Mild Transaminitis  Acute hepatitis panel negative - LFTs have normalized - suspect simple low-grade shock liver  Mild hypokalemia Due to poor intake - on IV KCL on IV fluids.  Mg normal.    DVT prophylaxis: Lovenox.  Code Status: FULL CODE Family Communication: care discussed with patient.  Disposition Plan: on IV antibiotics, start sips with clear today. Repeat labs in am.   Consultants:  Gen Surgery   Procedures: none  Antimicrobials:  Unasyn 1/28 Ciprofloxacin 1/28 Zosyn 1/28 >  Objective: Blood pressure 138/71, pulse 67, temperature 97.8 F (36.6 C), temperature source Oral, resp. rate 16, height 5\' 2"  (1.575 m), weight 78.9  kg (173 lb 14.4 oz), SpO2 96 %.  Intake/Output Summary (Last 24 hours) at 01/28/17 1409 Last data filed at 01/28/17 1014  Gross per 24 hour  Intake             1200 ml  Output             1675 ml  Net             -475 ml   Filed Weights   01/26/17 0440 01/27/17 0152 01/28/17 0628  Weight: 78.5 kg (173 lb) 79.2 kg (174 lb 11.2 oz) 78.9 kg (173 lb 14.4 oz)    Examination: General: No acute respiratory distress Lungs: Clear to auscultation bilaterally without wheezes or crackles Cardiovascular: Regular rate and rhythm without murmur gallop or rub normal S1 and S2 Abdomen: mild Tender diffusely without rebound, soft, bowel sounds positive, no appreciable mass Extremities: No significant cyanosis, clubbing, or edema bilateral lower extremities  CBC:  Recent Labs Lab 01/25/17 1417 01/26/17 0429 01/27/17 0444 01/28/17 0456  WBC 18.0* 13.7* 8.6 6.9  HGB 14.3 11.7* 11.4* 11.4*  HCT 42.4 34.6* 34.2* 33.6*  MCV 88.0 88.9 89.8 88.2  PLT 248 197 225 [id number]   Basic Metabolic Panel:  Recent Labs Lab 01/25/17 1417 01/26/17 0429 01/27/17 0444 01/28/17 0456  NA 136 140 141 142  K 3.8 3.4* 3.4* 4.0  CL 105 110 114* 112*  CO2 24 24 21* 25  GLUCOSE 139* 110* 69 116*  BUN 12 13 12 6   CREATININE 0.82 0.94 0.88 0.76  CALCIUM  8.3* 7.2* 7.6* 7.7*  MG  --   --  2.1  --    GFR: Estimated Creatinine Clearance: 63.6 mL/min (by C-G formula based on SCr of 0.76 mg/dL).  Liver Function Tests:  Recent Labs Lab 01/25/17 1417 01/26/17 0429 01/27/17 0444  AST 57* 27 20  ALT 55* 32 26  ALKPHOS 82 61 59  BILITOT 1.3* 1.4* 1.4*  PROT 6.2* 5.3* 5.2*  ALBUMIN 3.6 2.6* 2.5*    Recent Labs Lab 01/25/17 1417  LIPASE 18    Scheduled Meds: . enoxaparin (LOVENOX) injection  40 mg Subcutaneous Q24H  . pantoprazole (PROTONIX) IV  40 mg Intravenous Q24H  . piperacillin-tazobactam (ZOSYN)  IV  3.375 g Intravenous Q8H    LOS: 3 days   Niel Hummer, MD.  [facility] [phone]  If 7PM-7AM, please contact night-coverage [email or web address] Password [email or web address] 01/28/2017, 2:09 PM

## 2017-01-29 LAB — CBC
HEMATOCRIT: 36.9 % (ref 36.0–46.0)
Hemoglobin: 12.6 g/dL (ref 12.0–15.0)
MCH: 29.8 pg (ref 26.0–34.0)
MCHC: 34.1 g/dL (ref 30.0–36.0)
MCV: 87.2 fL (ref 78.0–100.0)
PLATELETS: 271 10*3/uL (ref 150–400)
RBC: 4.23 MIL/uL (ref 3.87–5.11)
RDW: 13.7 % (ref 11.5–15.5)
WBC: 7.4 10*3/uL (ref 4.0–10.5)

## 2017-01-29 LAB — BASIC METABOLIC PANEL
Anion gap: 7 (ref 5–15)
CO2: 24 mmol/L (ref 22–32)
Calcium: 8.3 mg/dL — ABNORMAL LOW (ref 8.9–10.3)
Chloride: 111 mmol/L (ref 101–111)
Creatinine, Ser: 0.82 mg/dL (ref 0.44–1.00)
GFR calc Af Amer: >60 mL/min (ref >60)
GFR calc non Af Amer: >60 mL/min (ref >60)
GLUCOSE: 117 mg/dL — AB (ref 65–99)
POTASSIUM: 4 mmol/L (ref 3.5–5.1)
Sodium: 142 mmol/L (ref 135–145)

## 2017-01-29 NOTE — Care Management Important Message (Signed)
Important Message  Patient Details  Name: DEANNDRA COOPRIDER MRN: [id number] Date of Birth: November 22, 1946   Medicare Important Message Given:  Yes    Ellieann, Sahl 01/29/2017, 10:45 AMImportant Message  Patient Details  Name: DURINDA KUPKA MRN: [id number] Date of Birth: 01-31-1946   Medicare Important Message Given:  Yes    Bradleigh, Hedin 01/29/2017, 10:44 AM

## 2017-01-29 NOTE — Progress Notes (Signed)
Patient ID: Melanie Nash, female   DOB: [birth year], 71 y.o.   MRN: [id number]  [facility] Surgery Progress Note     Subjective: Abdominal pain improved. States that she walked multiple laps yesterday. She had a large BM yesterday. Tolerating sips of clears.  Objective: Vital signs in last 24 hours: Temp:  [98.1 F (36.7 C)-98.4 F (36.9 C)] 98.3 F (36.8 C) (02/01 0525) Pulse Rate:  [57-71] 71 (02/01 0525) Resp:  [15-16] 15 (02/01 0525) BP: (141-151)/(80-92) 151/92 (02/01 0525) SpO2:  [98 %-99 %] 99 % (02/01 0525) Weight:  [171 lb 1.6 oz (77.6 kg)] 171 lb 1.6 oz (77.6 kg) (02/01 0524) Last BM Date: 01/28/17  Intake/Output from previous day: 01/31 0701 - 02/01 0700 In: 1708 [P.O.:8; I.V.:1650; IV Piggyback:50] Out: 2950 [Urine:2950] Intake/Output this shift: No intake/output data recorded.  PE: Gen:  Alert, NAD, pleasant Pulm:  Effort normal Abd: Soft, ND, little to no tenderness lower abdomen (improved from yesterday), +BS, no HSM Ext:  No erythema, edema, or tenderness   Lab Results:   Recent Labs  01/28/17 0456 01/29/17 0506  WBC 6.9 7.4  HGB 11.4* 12.6  HCT 33.6* 36.9  PLT 230 271   BMET  Recent Labs  01/28/17 0456 01/29/17 0506  NA 142 142  K 4.0 4.0  CL 112* 111  CO2 25 24  GLUCOSE 116* 117*  BUN 6 <5*  CREATININE 0.76 0.82  CALCIUM 7.7* 8.3*   PT/INR No results for input(s): LABPROT, INR in the last 72 hours. CMP     Component Value Date/Time   NA 142 01/29/2017 0506   K 4.0 01/29/2017 0506   CL 111 01/29/2017 0506   CO2 24 01/29/2017 0506   GLUCOSE 117 (H) 01/29/2017 0506   BUN <5 (L) 01/29/2017 0506   CREATININE 0.82 01/29/2017 0506   CALCIUM 8.3 (L) 01/29/2017 0506   PROT 5.2 (L) 01/27/2017 0444   ALBUMIN 2.5 (L) 01/27/2017 0444   AST 20 01/27/2017 0444   ALT 26 01/27/2017 0444   ALKPHOS 59 01/27/2017 0444   BILITOT 1.4 (H) 01/27/2017 0444   GFRNONAA >60 01/29/2017 0506   GFRAA >60 01/29/2017 0506   Lipase      Component Value Date/Time   LIPASE 18 01/25/2017 1417       Studies/Results: No results found.  Anti-infectives: Anti-infectives    Start     Dose/Rate Route Frequency Ordered Stop   01/28/17 2030  piperacillin-tazobactam (ZOSYN) IVPB 3.375 g     3.375 g 12.5 mL/hr over 240 Minutes Intravenous Every 8 hours 01/28/17 2022     01/26/17 1000  piperacillin-tazobactam (ZOSYN) IVPB 3.375 g  Status:  Discontinued     3.375 g 12.5 mL/hr over 240 Minutes Intravenous Every 8 hours 01/26/17 0913 01/28/17 2022   01/26/17 0800  piperacillin-tazobactam (ZOSYN) IVPB 3.375 g  Status:  Discontinued     3.375 g 12.5 mL/hr over 240 Minutes Intravenous Every 8 hours 01/25/17 2357 01/26/17 0913   01/25/17 2315  piperacillin-tazobactam (ZOSYN) IVPB 3.375 g     3.375 g 12.5 mL/hr over 240 Minutes Intravenous  Once 01/25/17 2311 01/26/17 0416   01/25/17 1730  Ampicillin-Sulbactam (UNASYN) 3 g in sodium chloride 0.9 % 100 mL IVPB     3 g 200 mL/hr over 30 Minutes Intravenous  Once 01/25/17 1727 01/25/17 1931   01/25/17 1730  ciprofloxacin (CIPRO) IVPB 400 mg     400 mg 200 mL/hr over 60 Minutes Intravenous  Once 01/25/17 1727 01/25/17  2041       Assessment/Plan Diverticulitis with microperf - CT 1/28 showed acute sigmoid colonic diverticulitis with associated micro perforation and small amount of gas within the adjacent colonic mesentery - WBC 7.4, afebrile  Left adnexal mass - planning u/s vs MRI in 6 weeks  ID - zosyn 1/31>> FEN - IVF, clear liquids VTE - SCDs, lovenox  Plan - Abdominal pain improving and WBC WNL. Advance to clear liquid diet. Continue to mobilize. If she continues to tolerate clears may advance to full liquids after lunch.   LOS: 4 days    Jerrye Beavers , [facility] Surgery 01/29/2017, 9:32 AM Pager: [phone] Consults: [phone] Mon-Fri 7:00 am-4:30 pm Sat-Sun 7:00 am-11:30 am

## 2017-01-29 NOTE — Progress Notes (Signed)
Pharmacy Antibiotic Note  Melanie Nash is a 71 y.o. female admitted on 01/25/2017 with diverticulitis with microperforation.  Patient's currently on day #3 of zosyn.  Today, 01/29/2017:  - afeb, wbc down wnl -  scr stable (crcl~54)   Plan:  Continue Zosyn 3.375gm IV q8h (each dose infused over 4 hrs)  With stable renal function, pharmacy will sign off for zosyn.  Re-consult [place] if needed further assistance  _____________________________________  Height: 5\' 2"  (157.5 cm) Weight: 171 lb 1.6 oz (77.6 kg) IBW/kg (Calculated) : 50.1  Temp (24hrs), Avg:98.3 F (36.8 C), Min:98.1 F (36.7 C), Max:98.4 F (36.9 C)   Recent Labs Lab 01/25/17 1417 01/26/17 0429 01/27/17 0444 01/28/17 0456 01/29/17 0506  WBC 18.0* 13.7* 8.6 6.9 7.4  CREATININE 0.82 0.94 0.88 0.76 0.82    Estimated Creatinine Clearance: 61.6 mL/min (by C-G formula based on SCr of 0.82 mg/dL).    No Known Allergies  Antimicrobials this admission:  1/28 unasyn x 1 1/28 cipro x 1 1/29 zosyn >>    Microbiology results:  No cx   Thank you for allowing pharmacy to be a part of this patient's care.  Lynelle Doctor, PharmD 01/29/2017 8:39 AM

## 2017-01-29 NOTE — Progress Notes (Signed)
Melanie Nash  [id number] DOB: October 21, 1946 DOA: 01/25/2017 PCP: [name]., MD    Brief Narrative:  71 y.o. female w diverticulosis who presented with c/o crampy bilateral lower abdominal pain x 2 days w/ nausea and loose stools 5+ times.  In the ED CT noted a left adnexal mass 3.1cm, as well as diverticultitis with microperforation.  Subjective: Abdomina pain improving, was able to tolerates spis of clears yesterday.  Plan for clear diet today. She has been waking in the hall.    Assessment & Plan:  1-Abdominal pain secondary to diverticulitis with microperforation Gen Surgery following -  Cont empiric Zosyn  WBC trending down from 18 to 7 Patient will needs colonoscopy after resolution of diverticulitis, likely out patient.  Tolerates sips of clears, plan to advance diet today, repeat labs in am,.   2-L Adnexal mass; -3.5 cm elongated ovoid fluid structure confirmed in left adnexa via transvaginal ultrasound - will require repeat pelvic ultrasound in 6 weeks or MRI with and without contrast after resolution of her acute diverticulitis - there is concern this may in fact be a neoplasm . -Patient aware of results.  -Care discussed with radiology, this could be hydrosalpinx, benign cyst or low grade malignancy. We should wait for diverticulitis to resolved. Could repeat US in 6 weeks, and or MRI in 6 weeks.    Mild Transaminitis  Acute hepatitis panel negative - LFTs have normalized - suspect simple low-grade shock liver  Mild hypokalemia Resolved.  Mg normal.    DVT prophylaxis: Lovenox.  Code Status: FULL CODE Family Communication: care discussed with patient.  Disposition Plan: on IV antibiotics,advanced diet today. Repeat labs in am.   Consultants:  Gen Surgery   Procedures: none  Antimicrobials:  Unasyn 1/28 Ciprofloxacin 1/28 Zosyn 1/28 >  Objective: Blood pressure (!) 151/92, pulse 71, temperature 98.3 F (36.8 C), temperature source Oral, resp.  rate 15, height 5\' 2"  (1.575 m), weight 77.6 kg (171 lb 1.6 oz), SpO2 99 %.  Intake/Output Summary (Last 24 hours) at 01/29/17 1104 Last data filed at 01/29/17 1056  Gross per 24 hour  Intake             1553 ml  Output             2650 ml  Net            -1097 ml   Filed Weights   01/27/17 0152 01/28/17 0628 01/29/17 0524  Weight: 79.2 kg (174 lb 11.2 oz) 78.9 kg (173 lb 14.4 oz) 77.6 kg (171 lb 1.6 oz)    Examination: General: No acute respiratory distress Lungs: Clear to auscultation bilaterally without wheezes or crackles Cardiovascular: Regular rate and rhythm without murmur gallop or rub normal S1 and S2 Abdomen: mild Tender diffusely without rebound, soft, bowel sounds positive, no appreciable mass Extremities: No significant cyanosis, clubbing, or edema bilateral lower extremities  CBC:  Recent Labs Lab 01/25/17 1417 01/26/17 0429 01/27/17 0444 01/28/17 0456 01/29/17 0506  WBC 18.0* 13.7* 8.6 6.9 7.4  HGB 14.3 11.7* 11.4* 11.4* 12.6  HCT 42.4 34.6* 34.2* 33.6* 36.9  MCV 88.0 88.9 89.8 88.2 87.2  PLT 248 197 225 230 [id number]   Basic Metabolic Panel:  Recent Labs Lab 01/25/17 1417 01/26/17 0429 01/27/17 0444 01/28/17 0456 01/29/17 0506  NA 136 140 141 142 142  K 3.8 3.4* 3.4* 4.0 4.0  CL 105 110 114* 112* 111  CO2 24 24 21* 25 24  GLUCOSE 139* 110* 69  116* 117*  BUN 12 13 12 6  <5*  CREATININE 0.82 0.94 0.88 0.76 0.82  CALCIUM 8.3* 7.2* 7.6* 7.7* 8.3*  MG  --   --  2.1  --   --    GFR: Estimated Creatinine Clearance: 61.6 mL/min (by C-G formula based on SCr of 0.82 mg/dL).  Liver Function Tests:  Recent Labs Lab 01/25/17 1417 01/26/17 0429 01/27/17 0444  AST 57* 27 20  ALT 55* 32 26  ALKPHOS 82 61 59  BILITOT 1.3* 1.4* 1.4*  PROT 6.2* 5.3* 5.2*  ALBUMIN 3.6 2.6* 2.5*    Recent Labs Lab 01/25/17 1417  LIPASE 18    Scheduled Meds: . enoxaparin (LOVENOX) injection  40 mg Subcutaneous Q24H  . pantoprazole (PROTONIX) IV  40 mg Intravenous  Q24H  . piperacillin-tazobactam (ZOSYN)  IV  3.375 g Intravenous Q8H    LOS: 4 days   Niel Hummer, MD.  [facility] [phone]  If 7PM-7AM, please contact night-coverage [email or web address] Password [email or web address] 01/29/2017, 11:04 AM

## 2017-01-30 LAB — CBC
HCT: 36.4 % (ref 36.0–46.0)
HEMOGLOBIN: 12.5 g/dL (ref 12.0–15.0)
MCH: 30 pg (ref 26.0–34.0)
MCHC: 34.3 g/dL (ref 30.0–36.0)
MCV: 87.5 fL (ref 78.0–100.0)
PLATELETS: 263 10*3/uL (ref 150–400)
RBC: 4.16 MIL/uL (ref 3.87–5.11)
RDW: 13.7 % (ref 11.5–15.5)
WBC: 6.2 10*3/uL (ref 4.0–10.5)

## 2017-01-30 MED ORDER — ACETAMINOPHEN 500 MG PO TABS
500.0000 mg | ORAL_TABLET | Freq: Four times a day (QID) | ORAL | 0 refills | Status: DC | PRN
Start: 2017-01-30 — End: 2019-10-14

## 2017-01-30 MED ORDER — AMOXICILLIN-POT CLAVULANATE 875-125 MG PO TABS: 1.0000 | ORAL_TABLET | Freq: Two times a day (BID) | ORAL | 0 refills | Status: DC

## 2017-01-30 MED ORDER — AMOXICILLIN-POT CLAVULANATE 875-125 MG PO TABS
1.0000 | ORAL_TABLET | Freq: Two times a day (BID) | ORAL | Status: DC
  Administered 2017-01-30: 1 via ORAL
  Filled 2017-01-30: qty 1

## 2017-01-30 MED ORDER — PANTOPRAZOLE SODIUM 40 MG PO TBEC
40.0000 mg | DELAYED_RELEASE_TABLET | Freq: Every day | ORAL | Status: DC
  Administered 2017-01-30: 40 mg via ORAL
  Filled 2017-01-30: qty 1

## 2017-01-30 NOTE — Discharge Summary (Signed)
Physician Discharge Summary  Melanie Nash [id number] DOB: 1946/09/07 DOA: 01/25/2017  PCP: [name]., MD  Admit date: 01/25/2017 Discharge date: 01/30/2017  Admitted From: Home  Disposition:  Home   Recommendations for Outpatient Follow-up:  1. Follow up with PCP in 1-2 weeks 2. Please obtain BMP/CBC in one week 3. Needs outpatient colonoscopy. 4. Needs follow up for ovarian cyst.  5. Needs repeat LFT     Discharge Condition: Stable.  CODE STATUS: Full code.  Diet recommendation: Heart Healthy   Brief/Interim Summary: 71 y.o.female w diverticulosis who presented with c/o crampy bilateral lower abdominal pain x 2 days w/ nausea and loose stools 5+ times.  In the ED CT noted a left adnexal mass 3.1cm, as well as diverticultitis with microperforation.  Assessment & Plan:  1-Abdominal pain secondary to diverticulitis with microperforation Gen Surgery following -  Treated with IV  Zosyn for 4 days.  WBC trending down from 18 to 7 Patient will needs colonoscopy after resolution of diverticulitis, likely out patient.  Plan to discharge on augmenti for 7 more days today if tolerates regular diet   2-L Adnexal mass; -3.5 cm elongated ovoid fluid structure confirmed in left adnexa via transvaginal ultrasound - will require repeat pelvic ultrasound in 6 weeks or MRI with and without contrast after resolution of her acute diverticulitis - there is concern this may in fact be a neoplasm . -Patient aware of results.  -Care discussed with radiology, this could be hydrosalpinx, benign cyst or low grade malignancy. We should wait for diverticulitis to resolved. Needs repeat US in 6 weeks, or MRI in 6 weeks.    Mild Transaminitis  Acute hepatitis panel negative - LFTs have normalized - suspect simple low-grade shock liver  Mild hypokalemia Resolved.  Mg normal.   Discharge Diagnoses:  Active Problems:   Diverticulitis   Abnormal liver function   Adnexal  mass    Discharge Instructions  Discharge Instructions    Diet - low sodium heart healthy    Complete by:  As directed    Increase activity slowly    Complete by:  As directed      Allergies as of 01/30/2017   No Known Allergies     Medication List    STOP taking these medications   ibuprofen 400 MG tablet Commonly known as:  ADVIL,MOTRIN   losartan 50 MG tablet Commonly known as:  COZAAR     TAKE these medications   acetaminophen 500 MG tablet Commonly known as:  TYLENOL Take 1 tablet (500 mg total) by mouth every 6 (six) hours as needed for mild pain (or Fever >/= 101).   amoxicillin-clavulanate 875-125 MG tablet Commonly known as:  AUGMENTIN Take 1 tablet by mouth every 12 (twelve) hours.   omeprazole 20 MG capsule Commonly known as:  PRILOSEC Take 1 capsule (20 mg total) by mouth at bedtime. Take this medication at [facility].       No Known Allergies  Consultations:  Surgery    Procedures/Studies: US Transvaginal Non-ob  Result Date: 01/25/2017 CLINICAL DATA:  3.1 cm left adnexal mass on CT abdomen/pelvis. EXAM: TRANSABDOMINAL AND TRANSVAGINAL ULTRASOUND OF PELVIS TECHNIQUE: Both transabdominal and transvaginal ultrasound examinations of the pelvis were performed. Transabdominal technique was performed for global imaging of the pelvis including uterus, ovaries, adnexal regions, and pelvic cul-de-sac. It was necessary to proceed with endovaginal exam following the transabdominal exam to visualize the ovaries and adnexa. COMPARISON:  CT abdomen/ pelvis earlier this day. FINDINGS: Uterus Measurements: 6.3  x 2.9 x 4.2 cm. At least 3 heterogeneous hypoechoic lesions within the myometrium area visualized, largest in the right lower uterine segment measures 1.8 x 2.1 x 2.0 cm. Two additional lesions are noted posteriorly measuring 0.8 x 1.2 x 1.0 cm and 1.8 x 2.1 x 2.0 cm. Endometrium Thickness: 1.9 mm.  No focal abnormality visualized. Right ovary Measurements: 2.2 x 1.4 x  1.3 cm. Normal quiescent appearance. No adnexal mass. Left ovary Measurements: Ovarian tissue not discretely visualized. There is a 3.5 x 1.0 x 2.7 cm fluid structure in the left adnexa. Peripheral soft tissue nodules within the structure do not have definite internal blood flow. Other findings No abnormal free fluid. IMPRESSION: 1. Left ovarian tissue not discretely visualized, there is a 3.5 cm elongated/ovoid fluid structure in the left adnexa, with possible peripheral nodules. This may be a cystic lesion replaces the normal ovarian parenchyma, which is abnormal for a postmenopausal patient. Recommend either follow-up pelvic ultrasound in 6 weeks or MRI without and with contrast after resolution of acute diverticulitis seen on CT. This does not appear adjacent to the colonic inflammation on CT to suggest abscess. 2. Uterine fibroids. Electronically Signed   By: Jeb Levering M.D.   On: 01/25/2017 21:21   US Pelvis Complete  Result Date: 01/25/2017 CLINICAL DATA:  3.1 cm left adnexal mass on CT abdomen/pelvis. EXAM: TRANSABDOMINAL AND TRANSVAGINAL ULTRASOUND OF PELVIS TECHNIQUE: Both transabdominal and transvaginal ultrasound examinations of the pelvis were performed. Transabdominal technique was performed for global imaging of the pelvis including uterus, ovaries, adnexal regions, and pelvic cul-de-sac. It was necessary to proceed with endovaginal exam following the transabdominal exam to visualize the ovaries and adnexa. COMPARISON:  CT abdomen/ pelvis earlier this day. FINDINGS: Uterus Measurements: 6.3 x 2.9 x 4.2 cm. At least 3 heterogeneous hypoechoic lesions within the myometrium area visualized, largest in the right lower uterine segment measures 1.8 x 2.1 x 2.0 cm. Two additional lesions are noted posteriorly measuring 0.8 x 1.2 x 1.0 cm and 1.8 x 2.1 x 2.0 cm. Endometrium Thickness: 1.9 mm.  No focal abnormality visualized. Right ovary Measurements: 2.2 x 1.4 x 1.3 cm. Normal quiescent appearance.  No adnexal mass. Left ovary Measurements: Ovarian tissue not discretely visualized. There is a 3.5 x 1.0 x 2.7 cm fluid structure in the left adnexa. Peripheral soft tissue nodules within the structure do not have definite internal blood flow. Other findings No abnormal free fluid. IMPRESSION: 1. Left ovarian tissue not discretely visualized, there is a 3.5 cm elongated/ovoid fluid structure in the left adnexa, with possible peripheral nodules. This may be a cystic lesion replaces the normal ovarian parenchyma, which is abnormal for a postmenopausal patient. Recommend either follow-up pelvic ultrasound in 6 weeks or MRI without and with contrast after resolution of acute diverticulitis seen on CT. This does not appear adjacent to the colonic inflammation on CT to suggest abscess. 2. Uterine fibroids. Electronically Signed   By: Jeb Levering M.D.   On: 01/25/2017 21:21   Ct Abdomen Pelvis W Contrast  Result Date: 01/25/2017 CLINICAL DATA:  Patient with sharp abdominal pain.  Diarrhea. EXAM: CT ABDOMEN AND PELVIS WITH CONTRAST TECHNIQUE: Multidetector CT imaging of the abdomen and pelvis was performed using the standard protocol following bolus administration of intravenous contrast. CONTRAST:  127mL ISOVUE-300 IOPAMIDOL (ISOVUE-300) INJECTION 61% COMPARISON:  CT abdomen pelvis 10/06/2009 FINDINGS: Lower chest: Normal heart size. Minimal dependent atelectasis within the lower lobes bilaterally. No pleural effusion. Hepatobiliary: Liver is normal in size and  contour. No focal lesion identified. Small amount of stones and sludge within the gallbladder lumen. No intrahepatic or extrahepatic biliary ductal dilatation. Pancreas: Unremarkable Spleen: Unremarkable Adrenals/Urinary Tract: The adrenal glands are normal. Kidneys enhance symmetrically with contrast. Bilateral parapelvic cysts. Urinary bladder is unremarkable. Stomach/Bowel: There is circumferential wall thickening of the descending/sigmoid colon (image  63; series 2) with surrounding fat stranding. There a few foci of gas within the adjacent colonic mesentery (image 70; series 4). The appendix is normal. No evidence for upstream bowel obstruction. Small hiatal hernia. Normal morphology of the stomach. Vascular/Lymphatic: Normal caliber abdominal aorta. Peripheral calcified atherosclerotic plaque involving the abdominal aorta. No retroperitoneal lymphadenopathy. Reproductive: Probable calcified fibroid along the right aspect of the uterine body. There is a 3.1 cm cystic lesion within the left adnexa. Right adnexa is unremarkable. Other: Small fat containing left inguinal hernia. Musculoskeletal: Lumbar spine degenerative changes. No aggressive or acute appearing osseous lesions. IMPRESSION: Findings most compatible with acute sigmoid colonic diverticulitis with associated micro perforation and small amount of gas within the adjacent colonic mesentery. If not previously performed, recommend correlation with colonoscopy after resolution of the acute symptomatology to exclude the possibility of colonic mass given the appearance. There is a 3.1 cm left adnexal cystic lesion. Recommend further evaluation with pelvic ultrasound. Cholelithiasis. Aortic atherosclerosis. Electronically Signed   By: Lovey Newcomer M.D.   On: 01/25/2017 16:47       Subjective: Feeling better, abdominal pain has improved.Marland Kitchen   Discharge Exam: Vitals:   01/29/17 2110 01/30/17 0525  BP: (!) 148/81 136/74  Pulse: 71 68  Resp: 18 18  Temp: 98 F (36.7 C) 98 F (36.7 C)   Vitals:   01/29/17 1103 01/29/17 1435 01/29/17 2110 01/30/17 0525  BP: 130/77 (!) 142/74 (!) 148/81 136/74  Pulse: 78 60 71 68  Resp:  15 18 18   Temp:  97.8 F (36.6 C) 98 F (36.7 C) 98 F (36.7 C)  TempSrc:  Oral Oral Oral  SpO2:  98% 95% 97%  Weight:    77.2 kg (170 lb 4.8 oz)  Height:        General: Pt is alert, awake, not in acute distress Cardiovascular: RRR, S1/S2 +, no rubs, no  gallops Respiratory: CTA bilaterally, no wheezing, no rhonchi Abdominal: Soft, NT, ND, bowel sounds + Extremities: no edema, no cyanosis    The results of significant diagnostics from this hospitalization (including imaging, microbiology, ancillary and laboratory) are listed below for reference.     Microbiology: No results found for this or any previous visit (from the past 240 hour(s)).   Labs: BNP (last 3 results) No results for input(s): BNP in the last 8760 hours. Basic Metabolic Panel:  Recent Labs Lab 01/25/17 1417 01/26/17 0429 01/27/17 0444 01/28/17 0456 01/29/17 0506  NA 136 140 141 142 142  K 3.8 3.4* 3.4* 4.0 4.0  CL 105 110 114* 112* 111  CO2 24 24 21* 25 24  GLUCOSE 139* 110* 69 116* 117*  BUN 12 13 12 6  <5*  CREATININE 0.82 0.94 0.88 0.76 0.82  CALCIUM 8.3* 7.2* 7.6* 7.7* 8.3*  MG  --   --  2.1  --   --    Liver Function Tests:  Recent Labs Lab 01/25/17 1417 01/26/17 0429 01/27/17 0444  AST 57* 27 20  ALT 55* 32 26  ALKPHOS 82 61 59  BILITOT 1.3* 1.4* 1.4*  PROT 6.2* 5.3* 5.2*  ALBUMIN 3.6 2.6* 2.5*    Recent Labs Lab 01/25/17  1417  LIPASE 18   No results for input(s): AMMONIA in the last 168 hours. CBC:  Recent Labs Lab 01/26/17 0429 01/27/17 0444 01/28/17 0456 01/29/17 0506 01/30/17 0447  WBC 13.7* 8.6 6.9 7.4 6.2  HGB 11.7* 11.4* 11.4* 12.6 12.5  HCT 34.6* 34.2* 33.6* 36.9 36.4  MCV 88.9 89.8 88.2 87.2 87.5  PLT 197 225 230 271 263   Cardiac Enzymes: No results for input(s): CKTOTAL, CKMB, CKMBINDEX, TROPONINI in the last 168 hours. BNP: Invalid input(s): POCBNP CBG: No results for input(s): GLUCAP in the last 168 hours. D-Dimer No results for input(s): DDIMER in the last 72 hours. Hgb A1c No results for input(s): HGBA1C in the last 72 hours. Lipid Profile No results for input(s): CHOL, HDL, LDLCALC, TRIG, CHOLHDL, LDLDIRECT in the last 72 hours. Thyroid function studies No results for input(s): TSH, T4TOTAL,  T3FREE, THYROIDAB in the last 72 hours.  Invalid input(s): FREET3 Anemia work up No results for input(s): VITAMINB12, FOLATE, FERRITIN, TIBC, IRON, RETICCTPCT in the last 72 hours. Urinalysis    Component Value Date/Time   COLORURINE YELLOW 01/25/2017 0017   APPEARANCEUR CLEAR 01/25/2017 0017   LABSPEC 1.041 (H) 01/25/2017 0017   PHURINE 6.0 01/25/2017 0017   GLUCOSEU NEGATIVE 01/25/2017 0017   HGBUR MODERATE (A) 01/25/2017 0017   BILIRUBINUR NEGATIVE 01/25/2017 0017   KETONESUR 5 (A) 01/25/2017 0017   PROTEINUR NEGATIVE 01/25/2017 0017   UROBILINOGEN 0.2 10/06/2009 0001   NITRITE NEGATIVE 01/25/2017 0017   LEUKOCYTESUR TRACE (A) 01/25/2017 0017   Sepsis Labs Invalid input(s): PROCALCITONIN,  WBC,  LACTICIDVEN Microbiology No results found for this or any previous visit (from the past 240 hour(s)).   Time coordinating discharge: Over 30 minutes  SIGNED:   Elmarie Shiley, MD  [facility] 01/30/2017, 2:06 PM Pager   If 7PM-7AM, please contact night-coverage [email or web address] Password [email or web address]

## 2017-01-30 NOTE — Discharge Instructions (Signed)
Diverticulitis °Diverticulitis is when small pockets that have formed in your colon (large intestine) become infected or swollen. °Follow these instructions at home: °· Follow your doctor's instructions. °· Follow a special diet if told by your doctor. °· When you feel better, your doctor may tell you to change your diet. You may be told to eat a lot of fiber. Fruits and vegetables are good sources of fiber. Fiber makes it easier to poop (have bowel movements). °· Take supplements or probiotics as told by your doctor. °· Only take medicines as told by your doctor. °· Keep all follow-up visits with your doctor. °Contact a doctor if: °· Your pain does not get better. °· You have a hard time eating food. °· You are not pooping like normal. °Get help right away if: °· Your pain gets worse. °· Your problems do not get better. °· Your problems suddenly get worse. °· You have a fever. °· You keep throwing up (vomiting). °· You have bloody or black, tarry poop (stool). °This information is not intended to replace advice given to you by your health care provider. Make sure you discuss any questions you have with your health care provider. °Document Released: 06/02/2008 Document Revised: 05/22/2016 Document Reviewed: 11/09/2013 °Elsevier Interactive Patient Education © 2017 [employer] ° °

## 2017-01-30 NOTE — Progress Notes (Signed)
Patient ID: Melanie Nash, female   DOB: 04-Jul-1946, 71 y.o.   MRN: [id number]  [facility] Surgery Progress Note     Subjective: Slept well. Tolerating full liquids. Denies n/v. Had a soft BM yesterday.   Objective: Vital signs in last 24 hours: Temp:  [97.8 F (36.6 C)-98 F (36.7 C)] 98 F (36.7 C) (02/02 0525) Pulse Rate:  [60-78] 68 (02/02 0525) Resp:  [15-18] 18 (02/02 0525) BP: (130-148)/(74-81) 136/74 (02/02 0525) SpO2:  [95 %-98 %] 97 % (02/02 0525) Weight:  [170 lb 4.8 oz (77.2 kg)] 170 lb 4.8 oz (77.2 kg) (02/02 0525) Last BM Date: 01/29/17  Intake/Output from previous day: 02/01 0701 - 02/02 0700 In: 1210 [P.O.:360; I.V.:600; IV Piggyback:250] Out: 2750 [Urine:2750] Intake/Output this shift: No intake/output data recorded.  PE: Gen:  Alert, NAD, pleasant Card:  RRR, no M/G/R heard Pulm:  CTAB, no W/R/R, effort normal Abd: Soft, ND, minimal LLQ tenderness, +BS, no HSM Ext:  No erythema, edema, or tenderness   Lab Results:   Recent Labs  01/29/17 0506 01/30/17 0447  WBC 7.4 6.2  HGB 12.6 12.5  HCT 36.9 36.4  PLT 271 263   BMET  Recent Labs  01/28/17 0456 01/29/17 0506  NA 142 142  K 4.0 4.0  CL 112* 111  CO2 25 24  GLUCOSE 116* 117*  BUN 6 <5*  CREATININE 0.76 0.82  CALCIUM 7.7* 8.3*   PT/INR No results for input(s): LABPROT, INR in the last 72 hours. CMP     Component Value Date/Time   NA 142 01/29/2017 0506   K 4.0 01/29/2017 0506   CL 111 01/29/2017 0506   CO2 24 01/29/2017 0506   GLUCOSE 117 (H) 01/29/2017 0506   BUN <5 (L) 01/29/2017 0506   CREATININE 0.82 01/29/2017 0506   CALCIUM 8.3 (L) 01/29/2017 0506   PROT 5.2 (L) 01/27/2017 0444   ALBUMIN 2.5 (L) 01/27/2017 0444   AST 20 01/27/2017 0444   ALT 26 01/27/2017 0444   ALKPHOS 59 01/27/2017 0444   BILITOT 1.4 (H) 01/27/2017 0444   GFRNONAA >60 01/29/2017 0506   GFRAA >60 01/29/2017 0506   Lipase     Component Value Date/Time   LIPASE 18 01/25/2017 1417        Studies/Results: No results found.  Anti-infectives: Anti-infectives    Start     Dose/Rate Route Frequency Ordered Stop   01/30/17 1000  amoxicillin-clavulanate (AUGMENTIN) 875-125 MG per tablet 1 tablet     1 tablet Oral Every 12 hours 01/30/17 0719     01/28/17 2030  piperacillin-tazobactam (ZOSYN) IVPB 3.375 g  Status:  Discontinued     3.375 g 12.5 mL/hr over 240 Minutes Intravenous Every 8 hours 01/28/17 2022 01/30/17 0719   01/26/17 1000  piperacillin-tazobactam (ZOSYN) IVPB 3.375 g  Status:  Discontinued     3.375 g 12.5 mL/hr over 240 Minutes Intravenous Every 8 hours 01/26/17 0913 01/28/17 2022   01/26/17 0800  piperacillin-tazobactam (ZOSYN) IVPB 3.375 g  Status:  Discontinued     3.375 g 12.5 mL/hr over 240 Minutes Intravenous Every 8 hours 01/25/17 2357 01/26/17 0913   01/25/17 2315  piperacillin-tazobactam (ZOSYN) IVPB 3.375 g     3.375 g 12.5 mL/hr over 240 Minutes Intravenous  Once 01/25/17 2311 01/26/17 0416   01/25/17 1730  Ampicillin-Sulbactam (UNASYN) 3 g in sodium chloride 0.9 % 100 mL IVPB     3 g 200 mL/hr over 30 Minutes Intravenous  Once 01/25/17 1727 01/25/17 1931  01/25/17 1730  ciprofloxacin (CIPRO) IVPB 400 mg     400 mg 200 mL/hr over 60 Minutes Intravenous  Once 01/25/17 1727 01/25/17 2041       Assessment/Plan Diverticulitis with microperf - CT 1/28 showed acute sigmoid colonic diverticulitis with associated micro perforation and small amount of gas within the adjacent colonic mesentery - WBC 6.2, afebrile  Left adnexal mass - planning u/s vs MRI in 6 weeks  ID - zosyn 1/31>>2/2, augmentin 2/ FEN - regular diet>> VTE - SCDs, lovenox  Plan - advance to regular diet. Transition from zosyn to PO Augmentin. Plan to recheck this afternoon, if doing well may be discharged home.    LOS: 5 days    Jerrye Beavers , [facility] 01/30/2017, 7:52 AM Pager: [phone] Consults: [phone] Mon-Fri 7:00 am-4:30  pm Sat-Sun 7:00 am-11:30 am

## 2017-02-06 DIAGNOSIS — K5732 Diverticulitis of large intestine without perforation or abscess without bleeding: Secondary | ICD-10-CM | POA: Diagnosis not present

## 2017-02-19 ENCOUNTER — Other Ambulatory Visit: Payer: Self-pay | Admitting: Family Medicine

## 2017-02-19 DIAGNOSIS — R1900 Intra-abdominal and pelvic swelling, mass and lump, unspecified site: Secondary | ICD-10-CM

## 2017-02-20 DIAGNOSIS — H353131 Nonexudative age-related macular degeneration, bilateral, early dry stage: Secondary | ICD-10-CM | POA: Diagnosis not present

## 2017-04-16 ENCOUNTER — Ambulatory Visit
Admission: RE | Admit: 2017-04-16 | Discharge: 2017-04-16 | Disposition: A | Discharge status: Home / Self Care | Payer: PPO | Source: Ambulatory Visit | Attending: Family Medicine | Admitting: Family Medicine

## 2017-04-16 DIAGNOSIS — R1907 Generalized intra-abdominal and pelvic swelling, mass and lump: Secondary | ICD-10-CM | POA: Diagnosis not present

## 2017-04-16 DIAGNOSIS — R1900 Intra-abdominal and pelvic swelling, mass and lump, unspecified site: Secondary | ICD-10-CM

## 2017-05-07 DIAGNOSIS — N644 Mastodynia: Secondary | ICD-10-CM | POA: Diagnosis not present

## 2017-05-07 DIAGNOSIS — N841 Polyp of cervix uteri: Secondary | ICD-10-CM | POA: Diagnosis not present

## 2017-05-07 DIAGNOSIS — N83202 Unspecified ovarian cyst, left side: Secondary | ICD-10-CM | POA: Diagnosis not present

## 2017-05-07 DIAGNOSIS — Z124 Encounter for screening for malignant neoplasm of cervix: Secondary | ICD-10-CM | POA: Diagnosis not present

## 2017-05-11 DIAGNOSIS — N83201 Unspecified ovarian cyst, right side: Secondary | ICD-10-CM | POA: Diagnosis not present

## 2017-05-11 DIAGNOSIS — N841 Polyp of cervix uteri: Secondary | ICD-10-CM | POA: Diagnosis not present

## 2017-05-12 ENCOUNTER — Other Ambulatory Visit: Payer: Self-pay | Admitting: Obstetrics and Gynecology

## 2017-05-12 DIAGNOSIS — N644 Mastodynia: Secondary | ICD-10-CM

## 2017-05-18 DIAGNOSIS — I10 Essential (primary) hypertension: Secondary | ICD-10-CM | POA: Diagnosis not present

## 2017-05-18 DIAGNOSIS — R109 Unspecified abdominal pain: Secondary | ICD-10-CM | POA: Diagnosis not present

## 2017-05-19 ENCOUNTER — Ambulatory Visit
Admission: RE | Admit: 2017-05-19 | Discharge: 2017-05-19 | Disposition: A | Discharge status: Home / Self Care | Payer: PPO | Source: Ambulatory Visit | Attending: Obstetrics and Gynecology | Admitting: Obstetrics and Gynecology

## 2017-05-19 DIAGNOSIS — N644 Mastodynia: Secondary | ICD-10-CM

## 2017-05-19 DIAGNOSIS — R928 Other abnormal and inconclusive findings on diagnostic imaging of breast: Secondary | ICD-10-CM | POA: Diagnosis not present

## 2017-05-27 ENCOUNTER — Telehealth: Payer: Self-pay | Admitting: *Deleted

## 2017-05-27 NOTE — Telephone Encounter (Signed)
Contacted the patient for new patient appt. Spoke with the daughter, gave appt of June 7th at [facility]; arrive at 8:454am. Patient's daughter verbalized understanding

## 2017-06-04 ENCOUNTER — Ambulatory Visit: Payer: PPO | Attending: Gynecologic Oncology | Admitting: Gynecologic Oncology

## 2017-06-04 ENCOUNTER — Encounter: Payer: Self-pay | Admitting: Gynecologic Oncology

## 2017-06-04 VITALS — BP 171/98 | HR 78 | Temp 97.8°F | Resp 20 | Ht 62.0 in | Wt 162.4 lb

## 2017-06-04 DIAGNOSIS — R197 Diarrhea, unspecified: Secondary | ICD-10-CM | POA: Diagnosis not present

## 2017-06-04 DIAGNOSIS — M797 Fibromyalgia: Secondary | ICD-10-CM | POA: Insufficient documentation

## 2017-06-04 DIAGNOSIS — K5720 Diverticulitis of large intestine with perforation and abscess without bleeding: Secondary | ICD-10-CM

## 2017-06-04 DIAGNOSIS — N83209 Unspecified ovarian cyst, unspecified side: Secondary | ICD-10-CM

## 2017-06-04 DIAGNOSIS — I10 Essential (primary) hypertension: Secondary | ICD-10-CM | POA: Insufficient documentation

## 2017-06-04 DIAGNOSIS — N83202 Unspecified ovarian cyst, left side: Secondary | ICD-10-CM | POA: Diagnosis not present

## 2017-06-04 DIAGNOSIS — K219 Gastro-esophageal reflux disease without esophagitis: Secondary | ICD-10-CM | POA: Diagnosis not present

## 2017-06-04 DIAGNOSIS — Z833 Family history of diabetes mellitus: Secondary | ICD-10-CM | POA: Diagnosis not present

## 2017-06-04 DIAGNOSIS — R1900 Intra-abdominal and pelvic swelling, mass and lump, unspecified site: Secondary | ICD-10-CM | POA: Insufficient documentation

## 2017-06-04 DIAGNOSIS — R3915 Urgency of urination: Secondary | ICD-10-CM | POA: Diagnosis not present

## 2017-06-04 DIAGNOSIS — Z8249 Family history of ischemic heart disease and other diseases of the circulatory system: Secondary | ICD-10-CM | POA: Diagnosis not present

## 2017-06-04 DIAGNOSIS — Z8719 Personal history of other diseases of the digestive system: Secondary | ICD-10-CM | POA: Diagnosis not present

## 2017-06-04 NOTE — Patient Instructions (Addendum)
Plan to have an ultrasound and CA 125 level drawn prior to your appt with Dr. Skeet Latch on August 2.  Arrive to your ultrasound with a full bladder (drink 32 ounces before).  Please call for any questions or concerns. Spoke with [name] over the phoneto give appointment dates and times.

## 2017-06-04 NOTE — Progress Notes (Signed)
Consult Note: Gyn-Onc  Consult was requested by Dr. Tiana Loft for the evaluation of Melanie Nash 71 y.o. female  CC:  Chief Complaint  Patient presents with  . Ovarian Cyst    Assessment/Plan:  Melanie Nash is a 71 y.o.  with a history of microperforation of the rectosigmoid in January 2018 and has been managed conservatively. Imaging at that time was notable for 3.1 cm left adnexal cyst. Follow-up imaging in April 2018 demonstrates persistence of a 3.8 complex left adnexal mass with peripheral nodules. CA-125 is within normal limits. Symptomatology is notable for anorexia and soreness in the left lower quadrant  likelihood of this being a malignant neoplasm is low.  The recommendation provided to Melanie Nash is for laparoscopy with bilateral salpingo-oophorectomy.  If malignancy was confirmed intraoperatively it would be our intent to perform staging. Their life at this time is quite complicated by the  management of significant conflict with their neighbor that will involve court appearances in discharge of their properties. At this time they both believe that a surgery cannot not be easily incorporated into their lives and wish and alternative recommendation.  The second option is for repeat ultrasound and CA-125 at the end of July with a plan for follow-up visits in my clinic to discuss the results and the definitive treatment plan on 07/30/2017.   Melanie Nash was counseled regarding the nonspecific signs and symptoms of ovarian/fallopian malignancy.   HPI: Melanie Nash is a 71 y.o.o presented to the emergency room in January 2018With complaints of crampy bilateral lower abdominal pain for 2 days with nausea and loose stools. In the emergency department the assessment was consistent with diverticulitis with microperforation. CT findings are as below. CT 01/25/2017 IMPRESSION: Findings most compatible with acute sigmoid colonic diverticulitis with associated micro perforation  and small amount of gas within the adjacent colonic mesentery. If not previously performed, recommend correlation with colonoscopy after resolution of the acute symptomatology to exclude the possibility of colonic mass given the appearance.  There is a 3.1 cm left adnexal cystic lesion.  She was treated with a course of antibiotics. She denies subsequent colonoscopy but does report a screening colonoscopy within the last 5-10 years that was within normal limits. A repeat pelvic ultrasound was obtained in June 2018 Pelvic UTZ 04/16/2017 As before, there is a 3.8 x 2.6 x 2.1 cm complex cystic mass at the left adnexa. Previously noted peripheral nodules are less well seen, but as before, this is abnormal in a postmenopausal patient. On the prior study, it measured 3.5 x 2.7 x 1.0 cm. Uterus  Measurements: 6.7 x 3.1 x 4.4 cm. Three posterior fibroids are seen, measuring 1.6 cm near the fundus, and 1.3 cm and 1.6 cm more inferiorly. One of these is myometrial in nature, while the other two are subserosal. Endometrium Thickness: 0.3 cm.  No focal abnormality visualized.  No abnormal free fluid.  CA 125 05/07/2017  Is 10 Pap 05/07/2017 wnl endocervical polyp confirmed.  Melanie Nash and her husband are in the miss of a challenging social situation involving conflict with their next her neighbor. She states that since April quit since with the time of this conflict she has had a change in appetite loose stools in the morning and early morning urinary urgency. She denies any nausea or vomiting she denies any abdominal discomfort or early satiety.  Her family history is unremarkable for any malignancy.  Review of Systems:  Constitutional  Feels well,  Cardiovascular  No chest pain, shortness of breath, or edema  Pulmonary  No cough or wheeze.  Gastro Intestinal  No nausea, vomitting, or diarrhoea. No bright red blood per rectum, no abdominal pain, change in bowel movement, or constipation. Poor  appetite since April, soft stool every morning Genito Urinary  No frequency, urgency, dysuria, reports early morning urgency  Musculo Skeletal  No myalgia, arthralgia, joint swelling occasional left abdominal wall soreness that occurs approximately once 2 times a week without radiation or without aggravating or mitigating factors  Neurologic  No weakness, numbness, change in gait,  Psychology  Significant stress associated with conflict with her neighbor  Current Meds:  Outpatient Encounter Prescriptions as of 06/04/2017  Medication Sig  . acetaminophen (TYLENOL) 500 MG tablet Take 1 tablet (500 mg total) by mouth every 6 (six) hours as needed for mild pain (or Fever >/= 101).  Marland Kitchen omeprazole (PRILOSEC) 20 MG capsule Take 1 capsule (20 mg total) by mouth at bedtime. Take this medication at [facility].  . [DISCONTINUED] amoxicillin-clavulanate (AUGMENTIN) 875-125 MG tablet Take 1 tablet by mouth every 12 (twelve) hours.   No facility-administered encounter medications on file as of 06/04/2017.     Allergy: No Known Allergies  Social Hx:   Social History   Social History  . Marital status: Married    Spouse name: N/A  . Number of children: 2  . Years of education: N/A   Occupational History  . caterer    Social History Main Topics  . Smoking status: Never Smoker  . Smokeless tobacco: Never Used  . Alcohol use No  . Drug use: No  . Sexual activity: Not on file   Other Topics Concern  . Not on file   Social History Narrative  . No narrative on file    Past Surgical Hx:  Past Surgical History:  Procedure Laterality Date  . COLONOSCOPY    . ESOPHAGOGASTRODUODENOSCOPY     dysphagia  . ESOPHAGOGASTRODUODENOSCOPY N/A 02/26/2016   Procedure: ESOPHAGOGASTRODUODENOSCOPY (EGD);  Surgeon: Milus Banister, MD;  Location: [place];  Service: Endoscopy;  Laterality: N/A;    Past Medical Hx:  Past Medical History:  Diagnosis Date  . Arthritis   . Diverticulosis   . Esophageal  stricture   . Fibromyalgia   . GERD (gastroesophageal reflux disease)   . Hiatal hernia   . Hypertension    pt not taking prescribed bp med  . Seasonal allergies    sinus infection    Past Gynecological History:  Gravida 2 para 2 menarche occurred at age 42 with regular menses until menopause in the late 73s. Spontaneous vaginal delivery 2 No LMP recorded. Patient is postmenopausal.  Family Hx:  Family History  Problem Relation Age of Onset  . Heart disease Mother   . Diabetes Sister   . Diabetes Maternal Uncle   . AAA (abdominal aortic aneurysm) Father   . Colon cancer Neg Hx   . Stomach cancer Neg Hx     Vitals:  Blood pressure (!) 171/98, pulse 78, temperature 97.8 F (36.6 C), resp. rate 20, height 5\' 2"  (1.575 m), weight 162 lb 6.4 oz (73.7 kg).  Physical Exam: WD in NAD Neck  Supple NROM, without any enlargements.  Lymph Node Survey No cervical supraclavicular or inguinal adenopathy Cardiovascular  Pulse normal rate, regularity and rhythm. S1 and S2 normal.  Lungs  Clear to auscultation bilaterally, without wheezes/crackles/rhonchi. Skin  No rash/lesions/breakdown  Psychiatry  Alert and oriented appropriate mood affect speech  and reasoning. Perseverates on the neighbor conflict Abdomen  Normoactive bowel sounds, abdomen soft, non-tender. No palpable fluid wave no omental cake no rebound or guarding  Back No CVA tenderness Genito Urinary  Vulva/vagina: Normal external female genitalia.  No lesions. No discharge or bleeding.  Bladder/urethra:  No lesions or masses  Vagina: Atrophic without discharge or bleeding Cervix: Normal appearing, no lesions.  Uterus: Mobile, no parametrial involvement or nodularity.  Adnexa: No palpable masses. Rectal  Good tone, no masses no cul de sac nodularity.  Extremities  No bilateral cyanosis, clubbing or edema.   Janie Morning, MD, PhD 06/04/2017, 9:40 AM

## 2017-07-28 ENCOUNTER — Other Ambulatory Visit (HOSPITAL_BASED_OUTPATIENT_CLINIC_OR_DEPARTMENT_OTHER): Payer: PPO

## 2017-07-28 ENCOUNTER — Ambulatory Visit (HOSPITAL_COMMUNITY)
Admission: RE | Admit: 2017-07-28 | Discharge: 2017-07-28 | Disposition: A | Discharge status: Home / Self Care | Payer: PPO | Source: Ambulatory Visit | Attending: Gynecologic Oncology | Admitting: Gynecologic Oncology

## 2017-07-28 DIAGNOSIS — N83209 Unspecified ovarian cyst, unspecified side: Secondary | ICD-10-CM

## 2017-07-28 DIAGNOSIS — N83202 Unspecified ovarian cyst, left side: Secondary | ICD-10-CM | POA: Diagnosis not present

## 2017-07-28 DIAGNOSIS — N83292 Other ovarian cyst, left side: Secondary | ICD-10-CM | POA: Insufficient documentation

## 2017-07-28 DIAGNOSIS — D259 Leiomyoma of uterus, unspecified: Secondary | ICD-10-CM | POA: Insufficient documentation

## 2017-07-29 LAB — CA 125: Cancer Antigen (CA) 125: 13.7 U/mL (ref 0.0–38.1)

## 2017-07-30 ENCOUNTER — Encounter: Payer: Self-pay | Admitting: Gynecologic Oncology

## 2017-07-30 ENCOUNTER — Ambulatory Visit: Payer: PPO | Attending: Gynecologic Oncology | Admitting: Gynecologic Oncology

## 2017-07-30 VITALS — BP 163/86 | HR 80 | Temp 97.5°F | Resp 18 | Ht 62.0 in | Wt 168.5 lb

## 2017-07-30 DIAGNOSIS — N83202 Unspecified ovarian cyst, left side: Secondary | ICD-10-CM

## 2017-07-30 DIAGNOSIS — I10 Essential (primary) hypertension: Secondary | ICD-10-CM | POA: Insufficient documentation

## 2017-07-30 DIAGNOSIS — M797 Fibromyalgia: Secondary | ICD-10-CM | POA: Diagnosis not present

## 2017-07-30 DIAGNOSIS — M1990 Unspecified osteoarthritis, unspecified site: Secondary | ICD-10-CM | POA: Insufficient documentation

## 2017-07-30 DIAGNOSIS — K219 Gastro-esophageal reflux disease without esophagitis: Secondary | ICD-10-CM | POA: Diagnosis not present

## 2017-07-30 DIAGNOSIS — N83292 Other ovarian cyst, left side: Secondary | ICD-10-CM | POA: Insufficient documentation

## 2017-07-30 DIAGNOSIS — Z8249 Family history of ischemic heart disease and other diseases of the circulatory system: Secondary | ICD-10-CM | POA: Diagnosis not present

## 2017-07-30 DIAGNOSIS — Z833 Family history of diabetes mellitus: Secondary | ICD-10-CM | POA: Diagnosis not present

## 2017-07-30 DIAGNOSIS — Z79899 Other long term (current) drug therapy: Secondary | ICD-10-CM | POA: Diagnosis not present

## 2017-07-30 DIAGNOSIS — N83209 Unspecified ovarian cyst, unspecified side: Secondary | ICD-10-CM

## 2017-07-30 NOTE — Progress Notes (Signed)
GYN ONCOLOGY OFFICE VISIT   Referring physician Dr. Tiana Loft CC:  Chief Complaint  Patient presents with  . Cyst of ovary, unspecified laterality    Assessment/Plan:  [name] is a 71 y.o.  with a history of microperforation of the rectosigmoid in January 2018 and has been managed conservatively. Imaging at that time was notable for 3.1 cm left adnexal cyst. Follow-up imaging in April 2018 demonstrates persistence of a now 3.8 complex left adnexal mass with peripheral nodules. CA-125 is within normal limits.   The recommendation provided to [name] is for laparoscopy with bilateral salpingo-oophorectomy. The patient and her husband were unable to incorporate the surgical procedure into their life because of other social priorities and requested repeat evaluation by ultrasound which demonstrated an increase in size of the pelvic mass to 5.9 cm. Repeat CA-125 is within normal limits.   Recommendation again provided is for minimally invasive bilateral salpingo-oophorectomy.  [name] are uncertain regarding whether or not they wish intervention. I discussed that the differential diagnosis includes benign neoplasm versus low malignant potential tumor versus an invasive epithelial ovarian neoplasm in order of likelihood. They will contact us on Monday, August 6 with their decision regarding intervention. Otherwise I have scheduled a follow-up appointment for 3 months so that [name] is not lost to follow-up   [name] was counseled regarding the nonspecific signs and symptoms of ovarian/fallopian malignancy.   HPI: [name] is a 71 y.o.o presented to the emergency room in January 2018 with complaints of crampy bilateral lower abdominal pain for 2 days with nausea and loose stools. In the emergency department the assessment was consistent with diverticulitis with microperforation. CT findings are as below. CT 01/25/2017 IMPRESSION: Findings most compatible  with acute sigmoid colonic diverticulitis with associated micro perforation and small amount of gas within the adjacent colonic mesentery. If not previously performed, recommend correlation with colonoscopy after resolution of the acute symptomatology to exclude the possibility of colonic mass given the appearance.  There is a 3.1 cm left adnexal cystic lesion.  She was treated with a course of antibiotics. She denies subsequent colonoscopy but does report a screening colonoscopy within the last 5-10 years that was within normal limits. A repeat pelvic ultrasound was obtained in June 2018 Pelvic UTZ 04/16/2017 3.8 x 2.6 x 2.1 cm complex cystic mass at the left adnexa. Previously noted peripheral nodules are less well seen, but as before, this is abnormal in a postmenopausal patient. On the prior study, it measured 3.5 x 2.7 x 1.0 cm. Uterus  Measurements: 6.7 x 3.1 x 4.4 cm. Three posterior fibroids are seen, measuring 1.6 cm near the fundus, and 1.3 cm and 1.6 cm more inferiorly. One of these is myometrial in nature, while the other two are subserosal. Endometrium Thickness: 0.3 cm.  No focal abnormality visualized.  No abnormal free fluid.  CA 125 05/07/2017   = 10 Pap 05/07/2017 wnl endocervical polyp confirmed. Repeat UTZ 06/2017 1. The previously noted complex left adnexal cystic mass again noted. The mass now measures 5.9 x 3.0 x 3.1 cm. Again a cystic tumor could present in this fashion and gynecologic evaluation suggested. MRI of the pelvis with contrast should be considered for further evaluation as previously noted.  2. Uterine fibroids again noted. Endometrial stripe 9 mm  She denies any nausea or vomiting she denies any abdominal discomfort or early satiety.  Her family history is unremarkable for any malignancy.  Review of  Systems:  Constitutional  Feels well,  Cardiovascular  No chest pain, shortness of breath, or edema  Pulmonary  No cough or wheeze.  Gastro Intestinal   No nausea, vomitting, or diarrhoea. No bright red blood per rectum, no abdominal pain, change in bowel movement, or constipation. Poor appetite since April, soft stool every morning Genito Urinary  No frequency, urgency, dysuria Musculo Skeletal  No myalgia, arthralgia, joint swelling Neurologic  No weakness, numbness, change in gait,    Current Meds:  Outpatient Encounter Prescriptions as of 07/30/2017  Medication Sig  . acetaminophen (TYLENOL) 500 MG tablet Take 1 tablet (500 mg total) by mouth every 6 (six) hours as needed for mild pain (or Fever >/= 101).  Marland Kitchen omeprazole (PRILOSEC) 20 MG capsule Take 1 capsule (20 mg total) by mouth at bedtime. Take this medication at [facility].   No facility-administered encounter medications on file as of 07/30/2017.     Allergy: No Known Allergies  Social Hx:   Social History   Social History  . Marital status: Married    Spouse name: N/A  . Number of children: 2  . Years of education: N/A   Occupational History  . caterer    Social History Main Topics  . Smoking status: Never Smoker  . Smokeless tobacco: Never Used  . Alcohol use No  . Drug use: No  . Sexual activity: Not on file   Other Topics Concern  . Not on file   Social History Narrative  . No narrative on file    Past Surgical Hx:  Past Surgical History:  Procedure Laterality Date  . COLONOSCOPY    . ESOPHAGOGASTRODUODENOSCOPY     dysphagia  . ESOPHAGOGASTRODUODENOSCOPY N/A 02/26/2016   Procedure: ESOPHAGOGASTRODUODENOSCOPY (EGD);  Surgeon: Milus Banister, MD;  Location: [place];  Service: Endoscopy;  Laterality: N/A;    Past Medical Hx:  Past Medical History:  Diagnosis Date  . Arthritis   . Diverticulosis   . Esophageal stricture   . Fibromyalgia   . GERD (gastroesophageal reflux disease)   . Hiatal hernia   . Hypertension    pt not taking prescribed bp med  . Seasonal allergies    sinus infection    Past Gynecological History:  Gravida 2 para 2  menarche occurred at age 82 with regular menses until menopause in the late 66s. Spontaneous vaginal delivery 2 No LMP recorded. Patient is postmenopausal.  Family Hx:  Family History  Problem Relation Age of Onset  . Heart disease Mother   . Diabetes Sister   . Diabetes Maternal Uncle   . AAA (abdominal aortic aneurysm) Father   . Colon cancer Neg Hx   . Stomach cancer Neg Hx     Vitals:  Blood pressure (!) 163/86, pulse 80, temperature (!) 97.5 F (36.4 C), temperature source Oral, resp. rate 18, height 5\' 2"  (1.575 m), weight 168 lb 8 oz (76.4 kg), SpO2 98 %.  Physical Exam: WD in NAD Neck  Supple NROM, without any enlargements.  Lymph Node Survey No cervical supraclavicular or inguinal adenopathy Cardiovascular  Pulse normal rate, regularity and rhythm. S1 and S2 normal.  Lungs  Clear to auscultation bilaterally, without wheezes/crackles/rhonchi. Skin  No rash/lesions/breakdown  Psychiatry  Alert and oriented appropriate mood affect speech and reasoning. Perseverates on the neighbor conflict Abdomen  Normoactive bowel sounds, abdomen soft, non-tender. No palpable fluid wave no omental cake no rebound or guarding  Back No CVA tenderness Genito Urinary  Vulva/vagina: Normal external female genitalia.  No lesions. No discharge or bleeding.  Bladder/urethra:  No lesions or masses  Vagina: Atrophic without discharge or bleeding Cervix: Normal appearing, no lesions.  Uterus: Mobile, no parametrial involvement or nodularity.  Adnexa: No palpable masses. Rectal  Good tone, no masses no cul de sac nodularity.  Extremities  No bilateral cyanosis, clubbing or edema.   Janie Morning, MD, PhD 07/30/2017, 10:11 AM

## 2017-07-30 NOTE — Patient Instructions (Signed)
Please call us if you decide to proceed with surgery.  We have made a three month appt for follow up just in case.                  Preparing for your Surgery  Pre-operative Testing -You will receive a phone call from presurgical testing at [facility] to arrange for a pre-operative testing appointment before your surgery.  This appointment normally occurs one to two weeks before your scheduled surgery.   -Bring your insurance card, copy of an advanced directive if applicable, medication list  -At that visit, you will be asked to sign a consent for a possible blood transfusion in case a transfusion becomes necessary during surgery.  The need for a blood transfusion is rare but having consent is a necessary part of your care.     -You should not be taking blood thinners or aspirin at least ten days prior to surgery unless instructed by your surgeon.  Day Before Surgery at [place] will be asked to take in a light diet the day before surgery.  Avoid carbonated beverages.  You will be advised to have nothing to eat or drink after midnight the evening before.     Eat a light diet the day before surgery.  Examples including soups, broths, toast, yogurt, mashed potatoes.  Things to avoid include carbonated beverages (fizzy beverages), raw fruits and raw vegetables, or beans.    If your bowels are filled with gas, your surgeon will have difficulty visualizing your pelvic organs which increases your surgical risks.  Your role in recovery Your role is to become active as soon as directed by your doctor, while still giving yourself time to heal.  Rest when you feel tired. You will be asked to do the following in order to speed your recovery:  - Cough and breathe deeply. This helps toclear and expand your lungs and can prevent pneumonia. You may be given a spirometer to practice deep breathing. A staff member will show you how to use the spirometer. - Do mild physical activity. Walking or  moving your legs help your circulation and body functions return to normal. A staff member will help you when you try to walk and will provide you with simple exercises. Do not try to get up or walk alone the first time. - Actively manage your pain. Managing your pain lets you move in comfort. We will ask you to rate your pain on a scale of zero to 10. It is your responsibility to tell your doctor or nurse where and how much you hurt so your pain can be treated.  Special Considerations -If you are diabetic, you may be placed on insulin after surgery to have closer control over your blood sugars to promote healing and recovery.  This does not mean that you will be discharged on insulin.  If applicable, your oral antidiabetics will be resumed when you are tolerating a solid diet.  -Your final pathology results from surgery should be available by the Friday after surgery and the results will be relayed to you when available.   Blood Transfusion Information WHAT IS A BLOOD TRANSFUSION? A transfusion is the replacement of blood or some of its parts. Blood is made up of multiple cells which provide different functions.  Red blood cells carry oxygen and are used for blood loss replacement.  White blood cells fight against infection.  Platelets control bleeding.  Plasma helps clot blood.  Other blood products  are available for specialized needs, such as hemophilia or other clotting disorders. BEFORE THE TRANSFUSION  Who gives blood for transfusions?   You may be able to donate blood to be used at a later date on yourself (autologous donation).  Relatives can be asked to donate blood. This is generally not any safer than if you have received blood from a stranger. The same precautions are taken to ensure safety when a relative's blood is donated.  Healthy volunteers who are fully evaluated to make sure their blood is safe. This is blood bank blood. Transfusion therapy is the safest it has ever  been in the practice of medicine. Before blood is taken from a donor, a complete history is taken to make sure that person has no history of diseases nor engages in risky social behavior (examples are intravenous drug use or sexual activity with multiple partners). The donor's travel history is screened to minimize risk of transmitting infections, such as malaria. The donated blood is tested for signs of infectious diseases, such as HIV and hepatitis. The blood is then tested to be sure it is compatible with you in order to minimize the chance of a transfusion reaction. If you or a relative donates blood, this is often done in anticipation of surgery and is not appropriate for emergency situations. It takes many days to process the donated blood. RISKS AND COMPLICATIONS Although transfusion therapy is very safe and saves many lives, the main dangers of transfusion include:   Getting an infectious disease.  Developing a transfusion reaction. This is an allergic reaction to something in the blood you were given. Every precaution is taken to prevent this. The decision to have a blood transfusion has been considered carefully by your caregiver before blood is given. Blood is not given unless the benefits outweigh the risks.

## 2017-09-25 ENCOUNTER — Other Ambulatory Visit: Payer: Self-pay

## 2017-09-25 DIAGNOSIS — K222 Esophageal obstruction: Secondary | ICD-10-CM

## 2017-09-25 DIAGNOSIS — K219 Gastro-esophageal reflux disease without esophagitis: Secondary | ICD-10-CM

## 2017-09-25 MED ORDER — OMEPRAZOLE 20 MG PO CPDR: 20.0000 mg | DELAYED_RELEASE_CAPSULE | Freq: Every day | ORAL | 11 refills | Status: DC

## 2017-09-28 IMAGING — CT CT ABD-PELV W/ CM
2 of 5 series · 16 of 46 positions shown, 18 images · IV contrast (ISOVUE)
Comparison: CT abdomen pelvis 10/06/2009

CLINICAL DATA: Patient with sharp abdominal pain.  Diarrhea.

EXAM:
CT ABDOMEN AND PELVIS WITH CONTRAST
TECHNIQUE: Multidetector CT imaging of the abdomen and pelvis was performed
using the standard protocol following bolus administration of
intravenous contrast.
CONTRAST:  100mL 8A36RP-LBB IOPAMIDOL (8A36RP-LBB) INJECTION 61%

[Series 2: abd/pel with · axial · 0.85mm/px · z∈[+876,+1240]mm · 13 of 87 slices shown, 15 images]
[im 7/87  soft-tissue]
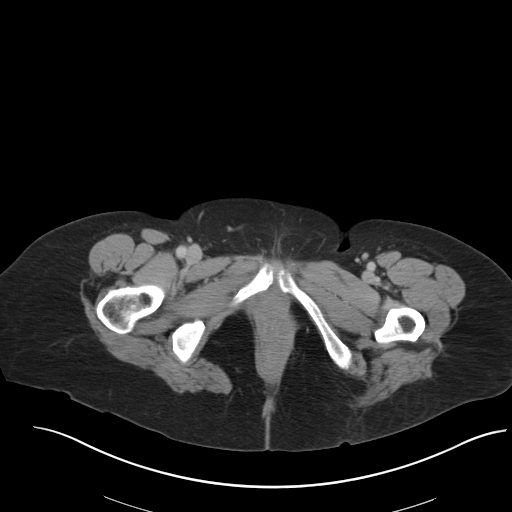
[im 7/87  bone]
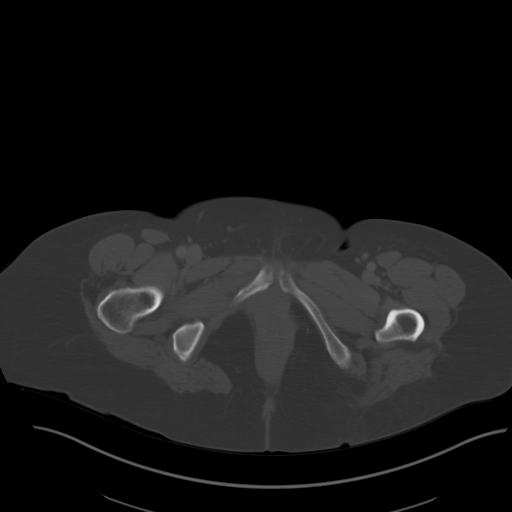
[im 13/87  soft-tissue]
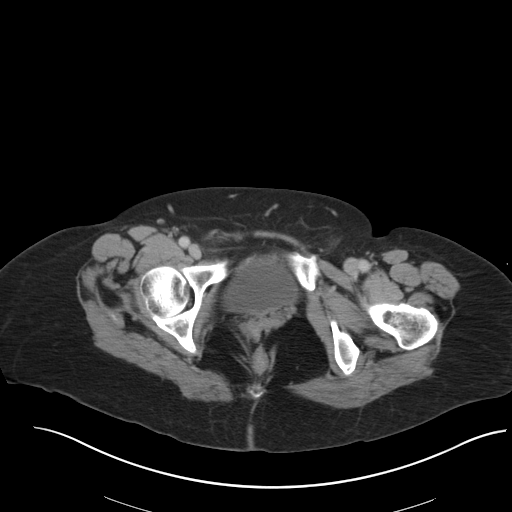
[im 19/87  soft-tissue]
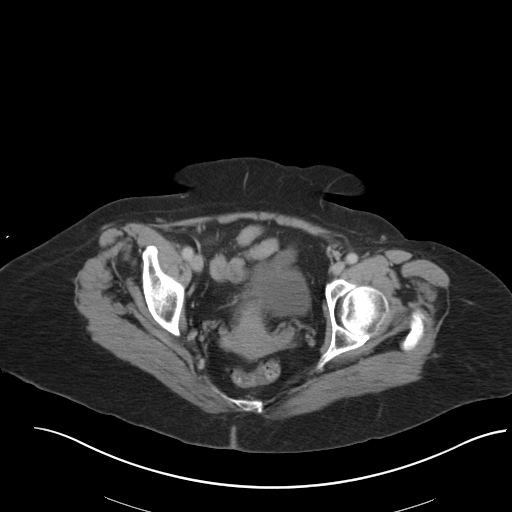
[im 25/87  soft-tissue]
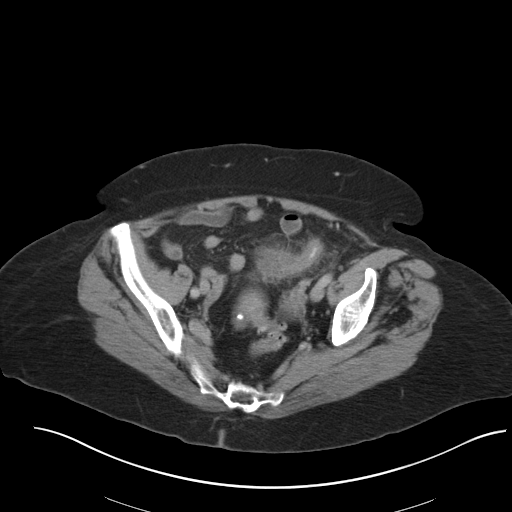
[im 31/87  soft-tissue]
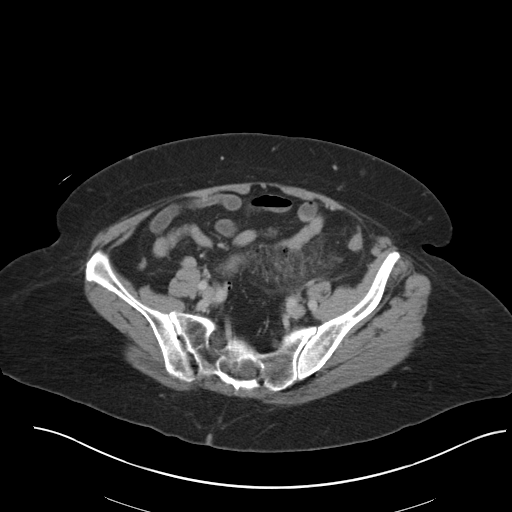
[im 37/87  soft-tissue]
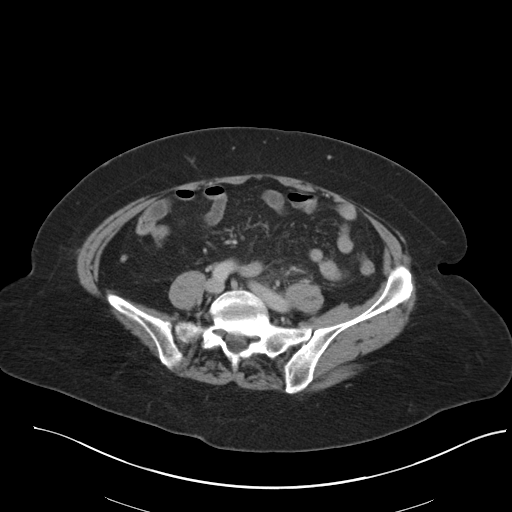
[im 44/87  soft-tissue]
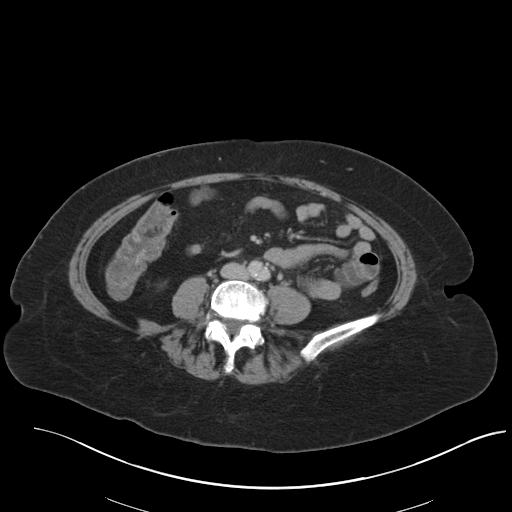
[im 50/87  soft-tissue]
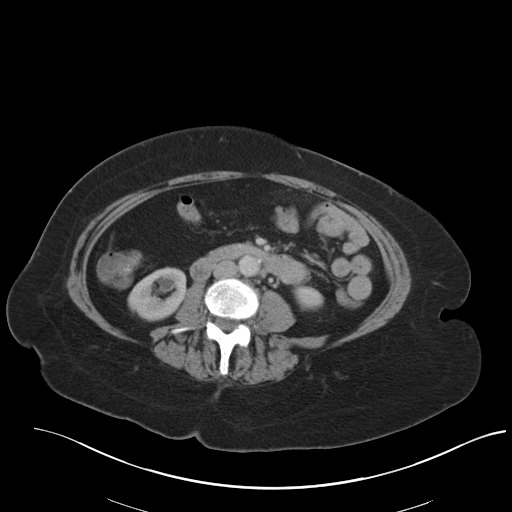
[im 56/87  soft-tissue]
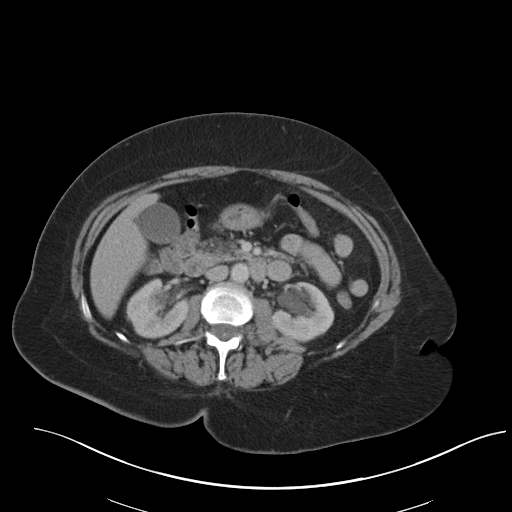
[im 56/87  bone]
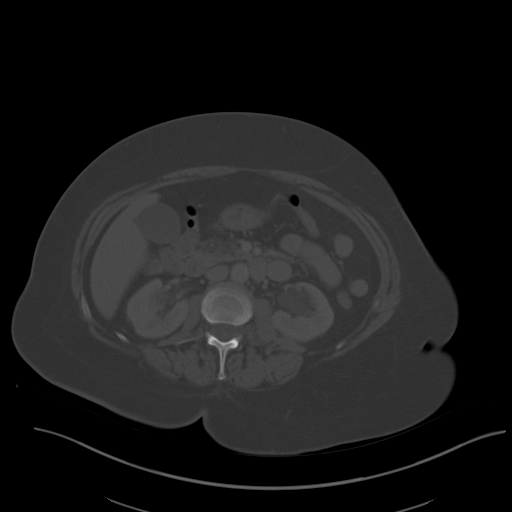
[im 62/87  soft-tissue]
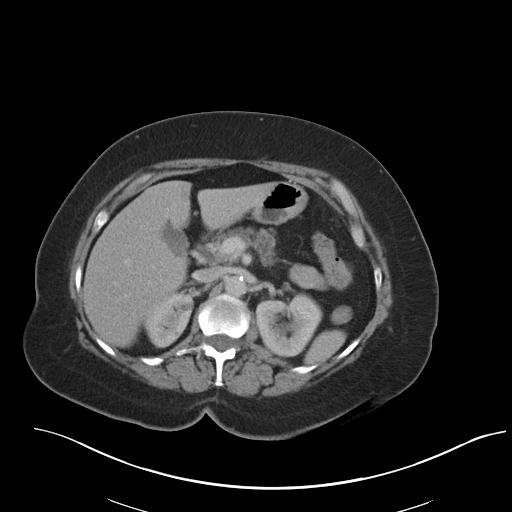
[im 68/87  soft-tissue]
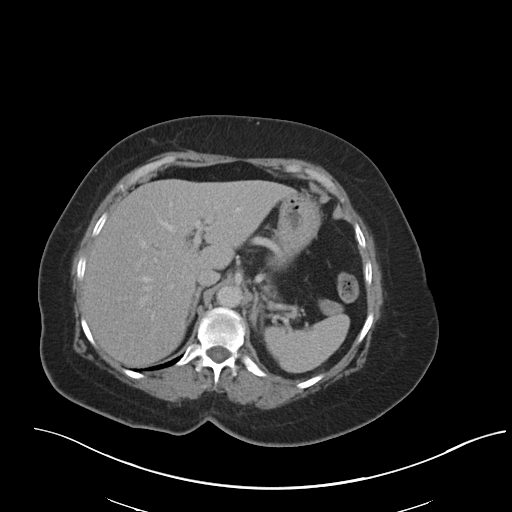
[im 74/87  soft-tissue]
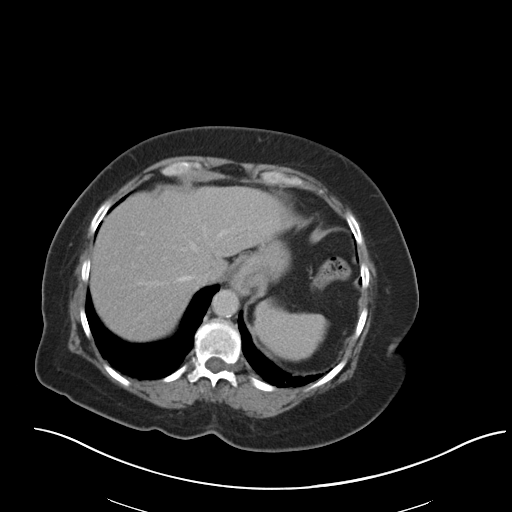
[im 80/87  soft-tissue]
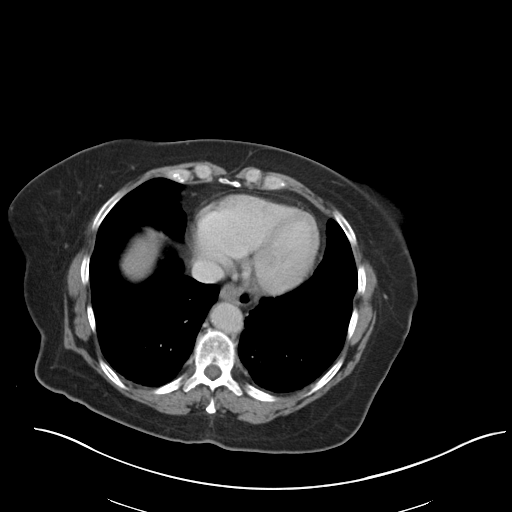

[Series 4: coronal a/|p · coronal · 0.94mm/px · 3 of 165 slices shown]
[im 55/165  soft-tissue]
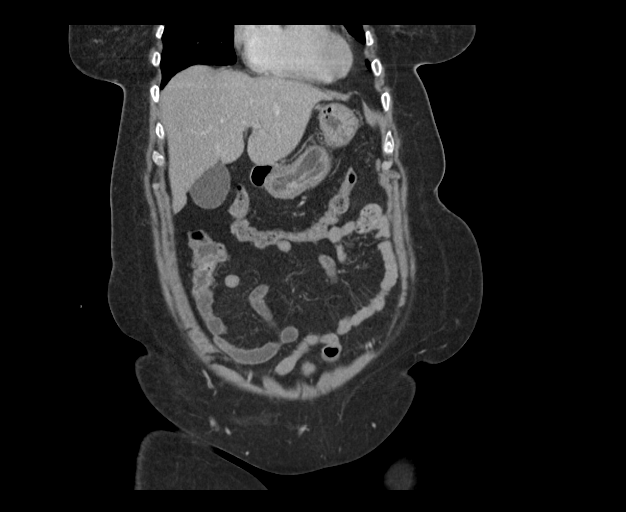
[im 73/165  soft-tissue]
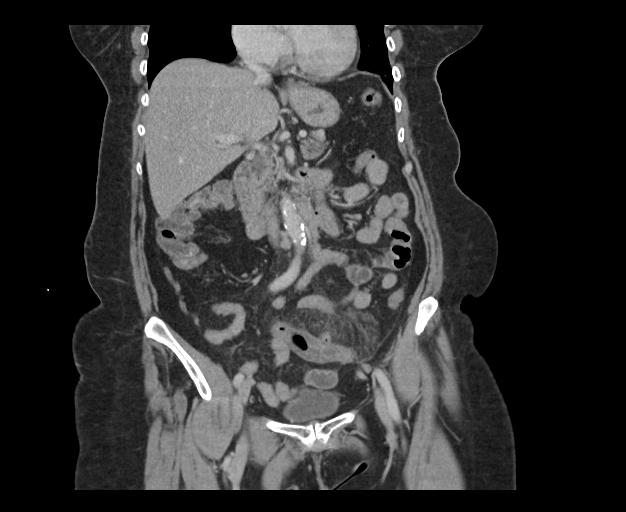
[im 92/165  soft-tissue]
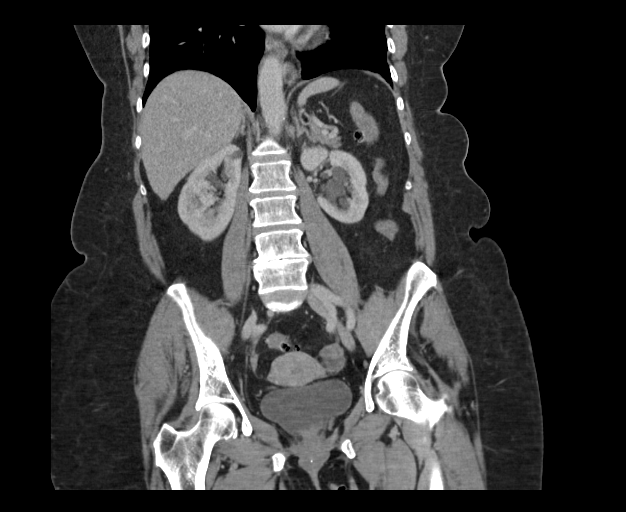

[16 of 46 positions shown; findings below may reference images not displayed]

FINDINGS: Lower chest: Normal heart size. Minimal dependent atelectasis within
the lower lobes bilaterally. No pleural effusion.

Hepatobiliary: Liver is normal in size and contour. No focal lesion
identified. Small amount of stones and sludge within the gallbladder
lumen. No intrahepatic or extrahepatic biliary ductal dilatation.

Pancreas: Unremarkable

Spleen: Unremarkable

Adrenals/Urinary Tract: The adrenal glands are normal. Kidneys
enhance symmetrically with contrast. Bilateral parapelvic cysts.
Urinary bladder is unremarkable.

Stomach/Bowel: There is circumferential wall thickening of the
descending/sigmoid colon (image 63; series 2) with surrounding fat
stranding. There a few foci of gas within the adjacent colonic
mesentery (image 70; series 4). The appendix is normal. No evidence
for upstream bowel obstruction. Small hiatal hernia. Normal
morphology of the stomach.

Vascular/Lymphatic: Normal caliber abdominal aorta. Peripheral
calcified atherosclerotic plaque involving the abdominal aorta. No
retroperitoneal lymphadenopathy.

Reproductive: Probable calcified fibroid along the right aspect of
the uterine body. There is a 3.1 cm cystic lesion within the left
adnexa. Right adnexa is unremarkable.

Other: Small fat containing left inguinal hernia.

Musculoskeletal: Lumbar spine degenerative changes. No aggressive or
acute appearing osseous lesions.
IMPRESSION: Findings most compatible with acute sigmoid colonic diverticulitis
with associated micro perforation and small amount of gas within the
adjacent colonic mesentery. If not previously performed, recommend
correlation with colonoscopy after resolution of the acute
symptomatology to exclude the possibility of colonic mass given the
appearance.

There is a 3.1 cm left adnexal cystic lesion. Recommend further
evaluation with pelvic ultrasound.

Cholelithiasis.

Aortic atherosclerosis.

## 2017-11-26 ENCOUNTER — Ambulatory Visit: Payer: PPO | Admitting: Gynecologic Oncology

## 2017-12-24 ENCOUNTER — Other Ambulatory Visit: Payer: Self-pay | Admitting: Family Medicine

## 2017-12-24 DIAGNOSIS — R1084 Generalized abdominal pain: Secondary | ICD-10-CM

## 2017-12-28 ENCOUNTER — Ambulatory Visit
Admission: RE | Admit: 2017-12-28 | Discharge: 2017-12-28 | Disposition: A | Discharge status: Home / Self Care | Payer: PPO | Source: Ambulatory Visit | Attending: Family Medicine | Admitting: Family Medicine

## 2017-12-28 DIAGNOSIS — N281 Cyst of kidney, acquired: Secondary | ICD-10-CM | POA: Diagnosis not present

## 2017-12-28 DIAGNOSIS — R1084 Generalized abdominal pain: Secondary | ICD-10-CM

## 2018-01-01 DIAGNOSIS — R109 Unspecified abdominal pain: Secondary | ICD-10-CM | POA: Diagnosis not present

## 2018-01-01 DIAGNOSIS — K589 Irritable bowel syndrome without diarrhea: Secondary | ICD-10-CM | POA: Diagnosis not present

## 2018-01-01 DIAGNOSIS — K5730 Diverticulosis of large intestine without perforation or abscess without bleeding: Secondary | ICD-10-CM | POA: Diagnosis not present

## 2018-01-01 DIAGNOSIS — I10 Essential (primary) hypertension: Secondary | ICD-10-CM | POA: Diagnosis not present

## 2018-11-11 ENCOUNTER — Ambulatory Visit: Payer: PPO | Admitting: Physician Assistant

## 2018-11-18 ENCOUNTER — Other Ambulatory Visit: Payer: Self-pay | Admitting: Gastroenterology

## 2018-11-18 DIAGNOSIS — K222 Esophageal obstruction: Secondary | ICD-10-CM

## 2018-11-18 DIAGNOSIS — K219 Gastro-esophageal reflux disease without esophagitis: Secondary | ICD-10-CM

## 2018-11-30 ENCOUNTER — Ambulatory Visit: Payer: PPO | Admitting: Physician Assistant

## 2018-12-02 ENCOUNTER — Ambulatory Visit: Payer: PPO | Admitting: Physician Assistant

## 2018-12-15 ENCOUNTER — Other Ambulatory Visit: Payer: Self-pay | Admitting: Gastroenterology

## 2018-12-15 DIAGNOSIS — K219 Gastro-esophageal reflux disease without esophagitis: Secondary | ICD-10-CM

## 2018-12-15 DIAGNOSIS — K222 Esophageal obstruction: Secondary | ICD-10-CM

## 2018-12-28 ENCOUNTER — Ambulatory Visit (INDEPENDENT_AMBULATORY_CARE_PROVIDER_SITE_OTHER): Payer: PPO | Admitting: Physician Assistant

## 2018-12-28 ENCOUNTER — Encounter: Payer: Self-pay | Admitting: Physician Assistant

## 2018-12-28 VITALS — BP 132/84 | HR 72 | Ht 62.0 in | Wt 168.1 lb

## 2018-12-28 DIAGNOSIS — R1310 Dysphagia, unspecified: Secondary | ICD-10-CM

## 2018-12-28 DIAGNOSIS — K222 Esophageal obstruction: Secondary | ICD-10-CM

## 2018-12-28 DIAGNOSIS — K219 Gastro-esophageal reflux disease without esophagitis: Secondary | ICD-10-CM

## 2018-12-28 MED ORDER — FAMOTIDINE 40 MG PO TABS: ORAL_TABLET | ORAL | 11 refills | Status: DC

## 2018-12-28 NOTE — Progress Notes (Signed)
Subjective:    Patient ID: Melanie Nash, female    DOB: [birth year], 72 y.o.   MRN: [id number]  HPI [name] is a pleasant 72 year old white female known to Dr. Ardis Hughs, who comes in today with complaints of recurrent dysphasia. Patient has history of prior esophageal stricture, and episode of food impaction in 2017.  She last had EGD in March 2017 with finding of a moderate stricture at the GE junction which was balloon dilated from 13.5 to 15.5 mm noted small hiatal hernia. Also with history of diverticulitis and admission in December 2018 with diverticulitis with microperforation. Her last colonoscopy was done in November 2010 showing moderate diverticulosis, no polyps it was indicated for 10-year interval follow-up. States that she has taken Prilosec off and on but has not taken it regularly because she does not necessarily feel like she needs it.  She is generally not having problems with reflux or heartburn.  She  is concerned about long-term side effects.  She says she started noticing recurrence of dysphasia the past couple of months.  Says occurred with bigger bites and often notes problems with chicken rice and bread.  She says she feels stressed whenever she gets any sense of food sticking and feels like she has to regurgitate.  She had an episode about a month ago where she was unable to get her food to go down.  She tried to regurgitate unsuccessfully.  She was able to speak but was frightened and asked her son to do a Heimlich maneuver.  She says she brought up a lot of liquid with this and then felt better.  Had a couple of other episodes with transient food lodging quiring regurgitation. She has no current lower GI symptoms.  Review of Systems;Pertinent positive and negative review of systems were noted in the above HPI section.  All other review of systems was otherwise negative.  Outpatient Encounter Medications as of 12/28/2018  Medication Sig  . acetaminophen (TYLENOL) 500 MG  tablet Take 1 tablet (500 mg total) by mouth every 6 (six) hours as needed for mild pain (or Fever >/= 101).  Marland Kitchen omeprazole (PRILOSEC) 20 MG capsule TAKE 1 CAPSULE BY MOUTH EACH NIGHT AT BEDTIME  . famotidine (PEPCID) 40 MG tablet Take 1 tablet by mouth every morning.   No facility-administered encounter medications on file as of 12/28/2018.    No Known Allergies Patient Active Problem List   Diagnosis Date Noted  . Diverticulitis 01/25/2017  . Abnormal liver function 01/25/2017  . Adnexal mass 01/25/2017  . Diverticulitis of large intestine with perforation without bleeding    Social History   Socioeconomic History  . Marital status: Married    Spouse name: Not on file  . Number of children: 2  . Years of education: Not on file  . Highest education level: Not on file  Occupational History  . Occupation: Engineer, agricultural Needs  . Financial resource strain: Not on file  . Food insecurity:    Worry: Not on file    Inability: Not on file  . Transportation needs:    Medical: Not on file    Non-medical: Not on file  Tobacco Use  . Smoking status: Never Smoker  . Smokeless tobacco: Never Used  Substance and Sexual Activity  . Alcohol use: No    Alcohol/week: 0.0 standard drinks  . Drug use: No  . Sexual activity: Not on file  Lifestyle  . Physical activity:    Days per week: Not on  file    Minutes per session: Not on file  . Stress: Not on file  Relationships  . Social connections:    Talks on phone: Not on file    Gets together: Not on file    Attends religious service: Not on file    Active member of club or organization: Not on file    Attends meetings of clubs or organizations: Not on file    Relationship status: Not on file  . Intimate partner violence:    Fear of current or ex partner: Not on file    Emotionally abused: Not on file    Physically abused: Not on file    Forced sexual activity: Not on file  Other Topics Concern  . Not on file  Social History  Narrative  . Not on file    Ms. Arshad's family history includes AAA (abdominal aortic aneurysm) in her father; Diabetes in her maternal uncle and sister; Heart disease in her mother.      Objective:    Vitals:   12/28/18 1018  BP: 132/84  Pulse: 72    Physical Exam; well-developed older white female in no acute distress, pleasant, BMI 30.7.  HEENT nontraumatic normocephalic EOMI PERRLA sclera anicteric oral mucosa moist, Cardiovascular regular rate and rhythm with S1-S2 no murmur rub or gallop, Pulmonary clear bilaterally, Abdomen soft, nontender nondistended bowel sounds are active there is no palpable mass or hepatosplenomegaly, Rectal exam not done, Extremities no clubbing cyanosis or edema skin warm and dry, Neuropsych alert and oriented, grossly nonfocal mood and affect appropriate       Assessment & Plan:   #34 72 year old white female with recurrent solid food dysphasia x2 months with episodes requiring regurgitation. Patient has history of esophageal stricture with prior food impaction, and last had esophageal dilation March 2017 with balloon dilation from 13.5 to 15.5 mm. Symptoms are consistent with recurrent stricture  #2 inconsistent use of PPI, and concerns about long-term side effects #3 history of diverticulitis #4 colon cancer surveillance-last colonoscopy November 2010 will be due for follow-up fall 2020  Plan; Patient will be scheduled for upper endoscopy with esophageal dilation with Dr. Ardis Hughs.  Procedure was discussed in detail with patient including indications risks and benefits and she is agreeable to proceed. In the interim she is asked to cut her food up into small pieces, chew her food carefully and avoid large pieces of meat and any other foods that she knows tend to cause problems. We discussed indication for acid suppression in her situation with recurrent esophageal stricture even though she is not symptomatic with heartburn, esophageal strictures are  generally caused by chronic acid reflux. Offered trial of H2 blocker which I think she is more likely to take.  Will start Pepcid 40 mg p.o. every morning.  She is advised if she seems to be having any evening heartburn type symptoms she can change dosing to 40 mg every afternoon.  Patient is advised that she will be due for follow-up colonoscopy and offered procedures to be scheduled on the same day.  She would like to deal with esophageal issues first, and plan to have colonoscopy later in the year in 2020.  Amy Genia Harold PA-C 12/28/2018   Cc: [name], MD

## 2018-12-28 NOTE — Patient Instructions (Signed)
We have provided you with Reflux infromation. We sent a prescription for Pepcid 40 mg to your pharmacy.  You will be due for a colonoscopy Fall of 2020. Y ou will get a letter in the mail to call us regarding scheduling the procedure.  You have been scheduled for an endoscopy. Please follow written instructions given to you at your visit today. If you use inhalers (even only as needed), please bring them with you on the day of your procedure. Your physician has requested that you go to www.startemmi.com and enter the access code given to you at your visit today. This web site gives a general overview about your procedure. However, you should still follow specific instructions given to you by our office regarding your preparation for the procedure.  Normal BMI (Body Mass Index- based on height and weight) is between 23 and 30. Your BMI today is Body mass index is 30.75 kg/m. Marland Kitchen Please consider follow up  regarding your BMI with your Primary Care Provider.

## 2018-12-31 NOTE — Progress Notes (Signed)
I agree with the above note, plan 

## 2019-01-24 ENCOUNTER — Encounter: Payer: Self-pay | Admitting: Gastroenterology

## 2019-02-04 ENCOUNTER — Encounter: Payer: PPO | Admitting: Gastroenterology

## 2019-02-07 ENCOUNTER — Encounter: Payer: PPO | Admitting: Gastroenterology

## 2019-02-09 ENCOUNTER — Encounter: Payer: Self-pay | Admitting: Gastroenterology

## 2019-02-09 ENCOUNTER — Ambulatory Visit (AMBULATORY_SURGERY_CENTER): Payer: PPO | Admitting: Gastroenterology

## 2019-02-09 VITALS — BP 155/92 | HR 93 | Temp 98.4°F | Resp 11 | Ht 62.0 in | Wt 168.0 lb

## 2019-02-09 DIAGNOSIS — K219 Gastro-esophageal reflux disease without esophagitis: Secondary | ICD-10-CM | POA: Diagnosis not present

## 2019-02-09 DIAGNOSIS — R1310 Dysphagia, unspecified: Secondary | ICD-10-CM | POA: Diagnosis not present

## 2019-02-09 DIAGNOSIS — K449 Diaphragmatic hernia without obstruction or gangrene: Secondary | ICD-10-CM

## 2019-02-09 DIAGNOSIS — K222 Esophageal obstruction: Secondary | ICD-10-CM

## 2019-02-09 DIAGNOSIS — M797 Fibromyalgia: Secondary | ICD-10-CM | POA: Diagnosis not present

## 2019-02-09 MED ORDER — SODIUM CHLORIDE 0.9 % IV SOLN: 500.0000 mL | Freq: Once | INTRAVENOUS | Status: DC

## 2019-02-09 NOTE — Patient Instructions (Signed)
YOU HAD AN ENDOSCOPIC PROCEDURE TODAY AT [facility]:   Refer to the procedure report that was given to you for any specific questions about what was found during the examination.  If the procedure report does not answer your questions, please call your gastroenterologist to clarify.  If you requested that your care partner not be given the details of your procedure findings, then the procedure report has been included in a sealed envelope for you to review at your convenience later.  YOU SHOULD EXPECT: Some feelings of bloating in the abdomen. Passage of more gas than usual.  Walking can help get rid of the air that was put into your GI tract during the procedure and reduce the bloating. If you had a lower endoscopy (such as a colonoscopy or flexible sigmoidoscopy) you may notice spotting of blood in your stool or on the toilet paper. If you underwent a bowel prep for your procedure, you may not have a normal bowel movement for a few days.  Please Note:  You might notice some irritation and congestion in your nose or some drainage.  This is from the oxygen used during your procedure.  There is no need for concern and it should clear up in a day or so.  SYMPTOMS TO REPORT IMMEDIATELY:    Following upper endoscopy (EGD)  Vomiting of blood or coffee ground material  New chest pain or pain under the shoulder blades  Painful or persistently difficult swallowing  New shortness of breath  Fever of 100F or higher  Black, tarry-looking stools  For urgent or emergent issues, a gastroenterologist can be reached at any hour by calling [phone].  Please follow up with Dr. Ardis Hughs in 4-6 weeks.   DIET:  See the post Dilation diet. Nothing by mouth for an hour, you may start clear liquids at 10:40 then soft diet for the rest of the day.  Tomorrow you may proceed to your regular diet.  Drink plenty of fluids but you should avoid alcoholic beverages for 24 hours.  ACTIVITY:  You  should plan to take it easy for the rest of today and you should NOT DRIVE or use heavy machinery until tomorrow (because of the sedation medicines used during the test).    FOLLOW UP: Our staff will call the number listed on your records the next business day following your procedure to check on you and address any questions or concerns that you may have regarding the information given to you following your procedure. If we do not reach you, we will leave a message.  However, if you are feeling well and you are not experiencing any problems, there is no need to return our call.  We will assume that you have returned to your regular daily activities without incident.  If any biopsies were taken you will be contacted by phone or by letter within the next 1-3 weeks.  Please call us at [phone] if you have not heard about the biopsies in 3 weeks.    SIGNATURES/CONFIDENTIALITY: You and/or your care partner have signed paperwork which will be entered into your electronic medical record.  These signatures attest to the fact that that the information above on your After Visit Summary has been reviewed and is understood.  Full responsibility of the confidentiality of this discharge information lies with you and/or your care-partner.  Thank you for letting us take care of your healthcare needs today.

## 2019-02-09 NOTE — Progress Notes (Signed)
Called to room to assist during endoscopic procedure.  Patient ID and intended procedure confirmed with present staff. Received instructions for my participation in the procedure from the performing physician.  

## 2019-02-09 NOTE — Op Note (Signed)
[facility] Patient Name: Melanie Nash Procedure Date: 02/09/2019 9:18 AM MRN: [id number] Endoscopist: Milus Banister , MD Age: 73 Referring MD:  Date of Birth: 1946-07-31 Gender: Female Account #: [id number] Procedure:                Upper GI endoscopy Indications:              Dysphagia; (esophageal food impaction treatment                            with EGD 01/2016, Dr. Ardis Hughs; histor of GE junction                            stricture, dilated Dr. Olevia Perches 2015); EGD Dr. Ardis Hughs                            02/2016 EGD with dilation to 15.48mm; EGD 03/2016                            dilation to 56mm with balloon Medicines:                Monitored Anesthesia Care Procedure:                Pre-Anesthesia Assessment:                           - Prior to the procedure, a History and Physical                            was performed, and patient medications and                            allergies were reviewed. The patient's tolerance of                            previous anesthesia was also reviewed. The risks                            and benefits of the procedure and the sedation                            options and risks were discussed with the patient.                            All questions were answered, and informed consent                            was obtained. Prior Anticoagulants: The patient has                            taken no previous anticoagulant or antiplatelet                            agents. ASA Grade Assessment: II - A patient with  mild systemic disease. After reviewing the risks                            and benefits, the patient was deemed in                            satisfactory condition to undergo the procedure.                           After obtaining informed consent, the endoscope was                            passed under direct vision. Throughout the                            procedure, the patient's blood  pressure, pulse, and                            oxygen saturations were monitored continuously. The                            Endoscope was introduced through the mouth, and                            advanced to the second part of duodenum. The upper                            GI endoscopy was accomplished without difficulty.                            The patient tolerated the procedure well. Scope In: Scope Out: Findings:                 One benign-appearing, intrinsic moderate stenosis                            was found at the gastroesophageal junction. This                            was a focal peptic stricture or thick Schatzki's                            type ring, lumen about 67mm across. I was able to                            advance a standard gastroscoe through the                            stricture. A TTS dilator was passed through the                            scope. Dilation with a 13.5-14.5-15.5 mm balloon  dilator was performed to 14.5 mm. There was the                            typical superficial mucosa tear and self limited                            oozing following dilation.                           Small hiatal hernia.                           The exam was otherwise without abnormality. Complications:            No immediate complications. Estimated Blood Loss:     Estimated blood loss: none. Impression:               - Benign-appearing esophageal stenosis. Dilated.                           - Small hiatal hernia.                           - The examination was otherwise normal. Recommendation:           - Resume previous diet.                           - Patient has a contact number available for                            emergencies. The signs and symptoms of potential                            delayed complications were discussed with the                            patient. Return to normal activities tomorrow.                             Written discharge instructions were provided to the                            patient.                           - Continue present medications. Please resume once                            daily omeprazole and call my office in 4-6 weeks to                            report on your symptoms. If you area still having                            swallowing difficulty, will repeat EGD with  dilation. Milus Banister, MD 02/09/2019 9:47:32 AM This report has been signed electronically.

## 2019-02-09 NOTE — Progress Notes (Signed)
Report to PACU, RN, vss, BBS= Clear.  

## 2019-02-10 ENCOUNTER — Telehealth: Payer: Self-pay | Admitting: *Deleted

## 2019-02-10 NOTE — Telephone Encounter (Signed)
  Follow up Call-  Call back number 02/09/2019  Post procedure Call Back phone  # [phone]  Permission to leave phone message Yes  Some recent data might be hidden    [facility]

## 2019-02-10 NOTE — Telephone Encounter (Signed)
  Follow up Call-  Call back number 02/09/2019  Post procedure Call Back phone  # [phone]  Permission to leave phone message Yes  Some recent data might be hidden     Patient questions:  Do you have a fever, pain , or abdominal swelling? No. Pain Score  0 *  Have you tolerated food without any problems? Yes.    Have you been able to return to your normal activities? Yes.    Do you have any questions about your discharge instructions: Diet   No. Medications  No. Follow up visit  No.  Do you have questions or concerns about your Care? No.  Actions: * If pain score is 4 or above: No action needed, pain <4.

## 2019-02-11 ENCOUNTER — Encounter: Payer: PPO | Admitting: Gastroenterology

## 2019-04-18 ENCOUNTER — Other Ambulatory Visit: Payer: Self-pay | Admitting: Gastroenterology

## 2019-04-18 DIAGNOSIS — K222 Esophageal obstruction: Secondary | ICD-10-CM

## 2019-04-18 DIAGNOSIS — K219 Gastro-esophageal reflux disease without esophagitis: Secondary | ICD-10-CM

## 2019-06-10 ENCOUNTER — Other Ambulatory Visit: Payer: Self-pay | Admitting: Gastroenterology

## 2019-06-10 DIAGNOSIS — K219 Gastro-esophageal reflux disease without esophagitis: Secondary | ICD-10-CM

## 2019-06-10 DIAGNOSIS — K222 Esophageal obstruction: Secondary | ICD-10-CM

## 2019-07-29 ENCOUNTER — Other Ambulatory Visit: Payer: Self-pay | Admitting: Gastroenterology

## 2019-07-29 DIAGNOSIS — K219 Gastro-esophageal reflux disease without esophagitis: Secondary | ICD-10-CM

## 2019-07-29 DIAGNOSIS — K222 Esophageal obstruction: Secondary | ICD-10-CM

## 2019-09-24 ENCOUNTER — Other Ambulatory Visit: Payer: Self-pay | Admitting: Gastroenterology

## 2019-09-24 DIAGNOSIS — K219 Gastro-esophageal reflux disease without esophagitis: Secondary | ICD-10-CM

## 2019-09-24 DIAGNOSIS — K222 Esophageal obstruction: Secondary | ICD-10-CM

## 2019-10-03 ENCOUNTER — Encounter: Payer: Self-pay | Admitting: Gastroenterology

## 2019-10-06 ENCOUNTER — Encounter: Payer: Self-pay | Admitting: Gastroenterology

## 2019-10-07 ENCOUNTER — Other Ambulatory Visit: Payer: Self-pay

## 2019-10-07 ENCOUNTER — Emergency Department (HOSPITAL_COMMUNITY): Payer: PPO

## 2019-10-07 ENCOUNTER — Encounter (HOSPITAL_COMMUNITY): Payer: Self-pay

## 2019-10-07 ENCOUNTER — Inpatient Hospital Stay (HOSPITAL_COMMUNITY)
Admission: EM | Admit: 2019-10-07 | Discharge: 2019-10-14 | DRG: 300 | Disposition: A | Discharge status: Home / Self Care | Payer: PPO | Attending: Student | Admitting: Student

## 2019-10-07 DIAGNOSIS — M1990 Unspecified osteoarthritis, unspecified site: Secondary | ICD-10-CM | POA: Diagnosis not present

## 2019-10-07 DIAGNOSIS — R000 Tachycardia, unspecified: Secondary | ICD-10-CM | POA: Diagnosis not present

## 2019-10-07 DIAGNOSIS — Z79899 Other long term (current) drug therapy: Secondary | ICD-10-CM

## 2019-10-07 DIAGNOSIS — I959 Hypotension, unspecified: Secondary | ICD-10-CM | POA: Diagnosis not present

## 2019-10-07 DIAGNOSIS — K5730 Diverticulosis of large intestine without perforation or abscess without bleeding: Secondary | ICD-10-CM | POA: Diagnosis not present

## 2019-10-07 DIAGNOSIS — R918 Other nonspecific abnormal finding of lung field: Secondary | ICD-10-CM | POA: Diagnosis not present

## 2019-10-07 DIAGNOSIS — R0902 Hypoxemia: Secondary | ICD-10-CM | POA: Diagnosis not present

## 2019-10-07 DIAGNOSIS — I169 Hypertensive crisis, unspecified: Secondary | ICD-10-CM | POA: Diagnosis present

## 2019-10-07 DIAGNOSIS — I7101 Dissection of thoracic aorta: Secondary | ICD-10-CM | POA: Diagnosis not present

## 2019-10-07 DIAGNOSIS — I7103 Dissection of thoracoabdominal aorta: Secondary | ICD-10-CM | POA: Diagnosis not present

## 2019-10-07 DIAGNOSIS — Z8249 Family history of ischemic heart disease and other diseases of the circulatory system: Secondary | ICD-10-CM | POA: Diagnosis not present

## 2019-10-07 DIAGNOSIS — K449 Diaphragmatic hernia without obstruction or gangrene: Secondary | ICD-10-CM | POA: Diagnosis present

## 2019-10-07 DIAGNOSIS — K219 Gastro-esophageal reflux disease without esophagitis: Secondary | ICD-10-CM | POA: Diagnosis not present

## 2019-10-07 DIAGNOSIS — M797 Fibromyalgia: Secondary | ICD-10-CM | POA: Diagnosis present

## 2019-10-07 DIAGNOSIS — R7989 Other specified abnormal findings of blood chemistry: Secondary | ICD-10-CM

## 2019-10-07 DIAGNOSIS — R079 Chest pain, unspecified: Secondary | ICD-10-CM | POA: Diagnosis not present

## 2019-10-07 DIAGNOSIS — R61 Generalized hyperhidrosis: Secondary | ICD-10-CM | POA: Diagnosis not present

## 2019-10-07 DIAGNOSIS — F439 Reaction to severe stress, unspecified: Secondary | ICD-10-CM | POA: Diagnosis not present

## 2019-10-07 DIAGNOSIS — I7102 Dissection of abdominal aorta: Secondary | ICD-10-CM | POA: Diagnosis present

## 2019-10-07 DIAGNOSIS — Z20828 Contact with and (suspected) exposure to other viral communicable diseases: Secondary | ICD-10-CM | POA: Diagnosis not present

## 2019-10-07 DIAGNOSIS — K222 Esophageal obstruction: Secondary | ICD-10-CM | POA: Diagnosis not present

## 2019-10-07 DIAGNOSIS — R17 Unspecified jaundice: Secondary | ICD-10-CM | POA: Diagnosis present

## 2019-10-07 DIAGNOSIS — K838 Other specified diseases of biliary tract: Secondary | ICD-10-CM | POA: Diagnosis not present

## 2019-10-07 DIAGNOSIS — R748 Abnormal levels of other serum enzymes: Secondary | ICD-10-CM | POA: Diagnosis not present

## 2019-10-07 DIAGNOSIS — E876 Hypokalemia: Secondary | ICD-10-CM | POA: Diagnosis present

## 2019-10-07 DIAGNOSIS — I7100 Dissection of unspecified site of aorta: Secondary | ICD-10-CM | POA: Diagnosis present

## 2019-10-07 DIAGNOSIS — R945 Abnormal results of liver function studies: Secondary | ICD-10-CM

## 2019-10-07 DIAGNOSIS — I712 Thoracic aortic aneurysm, without rupture: Secondary | ICD-10-CM | POA: Diagnosis not present

## 2019-10-07 DIAGNOSIS — R739 Hyperglycemia, unspecified: Secondary | ICD-10-CM | POA: Diagnosis present

## 2019-10-07 DIAGNOSIS — I361 Nonrheumatic tricuspid (valve) insufficiency: Secondary | ICD-10-CM | POA: Diagnosis not present

## 2019-10-07 DIAGNOSIS — R0602 Shortness of breath: Secondary | ICD-10-CM | POA: Diagnosis not present

## 2019-10-07 DIAGNOSIS — K828 Other specified diseases of gallbladder: Secondary | ICD-10-CM | POA: Diagnosis not present

## 2019-10-07 DIAGNOSIS — I10 Essential (primary) hypertension: Secondary | ICD-10-CM | POA: Diagnosis present

## 2019-10-07 DIAGNOSIS — I71019 Dissection of thoracic aorta, unspecified: Secondary | ICD-10-CM

## 2019-10-07 DIAGNOSIS — Z91048 Other nonmedicinal substance allergy status: Secondary | ICD-10-CM

## 2019-10-07 LAB — CBC
HCT: 44.6 % (ref 36.0–46.0)
Hemoglobin: 14.7 g/dL (ref 12.0–15.0)
MCH: 30.2 pg (ref 26.0–34.0)
MCHC: 33 g/dL (ref 30.0–36.0)
MCV: 91.6 fL (ref 80.0–100.0)
Platelets: 231 10*3/uL (ref 150–400)
RBC: 4.87 MIL/uL (ref 3.87–5.11)
RDW: 13.3 % (ref 11.5–15.5)
WBC: 8.9 10*3/uL (ref 4.0–10.5)
nRBC: 0 % (ref 0.0–0.2)

## 2019-10-07 LAB — BASIC METABOLIC PANEL
Anion gap: 13 (ref 5–15)
BUN: 13 mg/dL (ref 8–23)
CO2: 23 mmol/L (ref 22–32)
Calcium: 8.8 mg/dL — ABNORMAL LOW (ref 8.9–10.3)
Chloride: 104 mmol/L (ref 98–111)
Creatinine, Ser: 0.9 mg/dL (ref 0.44–1.00)
GFR calc Af Amer: >60 mL/min (ref >60)
GFR calc non Af Amer: >60 mL/min (ref >60)
Glucose, Bld: 176 mg/dL — ABNORMAL HIGH (ref 70–99)
Potassium: 3.4 mmol/L — ABNORMAL LOW (ref 3.5–5.1)
Sodium: 140 mmol/L (ref 135–145)

## 2019-10-07 LAB — LIPASE, BLOOD: Lipase: 18 U/L (ref 11–51)

## 2019-10-07 LAB — TROPONIN I (HIGH SENSITIVITY)
Troponin I (High Sensitivity): 4 ng/L (ref <18)
Troponin I (High Sensitivity): 3 ng/L (ref <18)

## 2019-10-07 LAB — MRSA PCR SCREENING: MRSA by PCR: NEGATIVE

## 2019-10-07 LAB — SARS CORONAVIRUS 2 BY RT PCR (HOSPITAL ORDER, PERFORMED IN ~~LOC~~ HOSPITAL LAB): SARS Coronavirus 2: NEGATIVE

## 2019-10-07 MED ORDER — MORPHINE SULFATE (PF) 2 MG/ML IV SOLN
1.0000 mg | INTRAVENOUS | Status: DC | PRN
  Administered 2019-10-07 (×3): 2 mg via INTRAVENOUS
  Filled 2019-10-07 (×5): qty 1

## 2019-10-07 MED ORDER — MORPHINE SULFATE (PF) 2 MG/ML IV SOLN
0.5000 mg | Freq: Once | INTRAVENOUS | Status: AC | PRN
  Administered 2019-10-07: 0.5 mg via INTRAVENOUS
  Filled 2019-10-07: qty 1

## 2019-10-07 MED ORDER — HYDROMORPHONE HCL 1 MG/ML IJ SOLN
0.5000 mg | Freq: Once | INTRAMUSCULAR | Status: AC
  Administered 2019-10-07: 0.5 mg via INTRAVENOUS
  Filled 2019-10-07: qty 1

## 2019-10-07 MED ORDER — SODIUM CHLORIDE 0.9% FLUSH
3.0000 mL | Freq: Once | INTRAVENOUS | Status: AC
  Administered 2019-10-07: 3 mL via INTRAVENOUS

## 2019-10-07 MED ORDER — ESMOLOL HCL-SODIUM CHLORIDE 2000 MG/100ML IV SOLN
25.0000 ug/kg/min | INTRAVENOUS | Status: DC
  Administered 2019-10-07: 175 ug/kg/min via INTRAVENOUS
  Administered 2019-10-07 (×2): 200 ug/kg/min via INTRAVENOUS
  Administered 2019-10-07: 225 ug/kg/min via INTRAVENOUS
  Administered 2019-10-07: 200 ug/kg/min via INTRAVENOUS
  Administered 2019-10-07: 12:00:00 25 ug/kg/min via INTRAVENOUS
  Administered 2019-10-08: 06:00:00 75 ug/kg/min via INTRAVENOUS
  Administered 2019-10-08: 100 ug/kg/min via INTRAVENOUS
  Administered 2019-10-08: 150 ug/kg/min via INTRAVENOUS
  Administered 2019-10-08: 19:00:00 100 ug/kg/min via INTRAVENOUS
  Administered 2019-10-09: 09:00:00 125 ug/kg/min via INTRAVENOUS
  Filled 2019-10-07 (×15): qty 100

## 2019-10-07 MED ORDER — ONDANSETRON HCL 4 MG/2ML IJ SOLN: 4.0000 mg | Freq: Four times a day (QID) | INTRAMUSCULAR | Status: DC | PRN

## 2019-10-07 MED ORDER — PANTOPRAZOLE SODIUM 40 MG PO TBEC
40.0000 mg | DELAYED_RELEASE_TABLET | Freq: Every day | ORAL | Status: DC
  Administered 2019-10-07 – 2019-10-13 (×7): 40 mg via ORAL
  Filled 2019-10-07 (×8): qty 1

## 2019-10-07 MED ORDER — FENTANYL CITRATE (PF) 100 MCG/2ML IJ SOLN
50.0000 ug | Freq: Once | INTRAMUSCULAR | Status: AC
  Administered 2019-10-07: 50 ug via INTRAVENOUS
  Filled 2019-10-07: qty 2

## 2019-10-07 MED ORDER — IOHEXOL 350 MG/ML SOLN
100.0000 mL | Freq: Once | INTRAVENOUS | Status: AC | PRN
  Administered 2019-10-07: 100 mL via INTRAVENOUS

## 2019-10-07 MED ORDER — CHLORHEXIDINE GLUCONATE CLOTH 2 % EX PADS
6.0000 | MEDICATED_PAD | Freq: Every day | CUTANEOUS | Status: DC
  Administered 2019-10-07 – 2019-10-09 (×3): 6 via TOPICAL

## 2019-10-07 MED ORDER — ACETAMINOPHEN 325 MG PO TABS
650.0000 mg | ORAL_TABLET | ORAL | Status: DC | PRN
  Administered 2019-10-08 – 2019-10-13 (×8): 650 mg via ORAL
  Filled 2019-10-07 (×9): qty 2

## 2019-10-07 MED ORDER — HALOPERIDOL LACTATE 5 MG/ML IJ SOLN
2.0000 mg | Freq: Once | INTRAMUSCULAR | Status: AC
  Administered 2019-10-07: 11:00:00 2 mg via INTRAVENOUS
  Filled 2019-10-07: qty 1

## 2019-10-07 MED ORDER — HYDRALAZINE HCL 20 MG/ML IJ SOLN
10.0000 mg | INTRAMUSCULAR | Status: DC | PRN
  Administered 2019-10-07: 10 mg via INTRAVENOUS
  Administered 2019-10-09 – 2019-10-11 (×4): 20 mg via INTRAVENOUS
  Filled 2019-10-07 (×7): qty 1

## 2019-10-07 MED ORDER — FENTANYL CITRATE (PF) 100 MCG/2ML IJ SOLN
50.0000 ug | INTRAMUSCULAR | Status: DC | PRN
  Administered 2019-10-07: 50 ug via INTRAVENOUS
  Filled 2019-10-07: qty 2

## 2019-10-07 NOTE — ED Notes (Signed)
ED TO INPATIENT HANDOFF REPORT  ED Nurse Name and Phone #: Harriette Bouillon [phone]  S Name/Age/Gender Melanie Nash 73 y.o. female Room/Bed: 017C/017C  Code Status   Code Status: Partial Code  Home/SNF/Other Home Patient oriented to: self, place, time and situation Is this baseline? Yes   Triage Complete: Triage complete  Chief Complaint Chest Pain  Triage Note Patient came in by EMS with a complaint of chest pain that woke her up. Radiates to the back, describes as crushing. Pale, cool, diaphoretic, sob. 324 mg aspirin and four 0.4 mg SL nitro were given and the patient has improved according to EMS.   Allergies No Known Allergies  Level of Care/Admitting Diagnosis ED Disposition    ED Disposition Condition [facility] Area: [place] [[id number]]  Level of Care: ICU [6]  Covid Evaluation: N/A  [facility]: [id number]  Admitting Physician: Rigoberto Noel [[id number]]  Attending Physician: Kara Mead V [[id number]]  Estimated length of stay: 3 - 4 days  Certification:: I certify this patient will need inpatient services for at least 2 midnights  PT Class (Do Not Modify): Inpatient [101]  PT Acc Code (Do Not Modify): Private [1]       B Medical/Surgery History Past Medical History:  Diagnosis Date  . Allergy   . Arthritis   . Diverticulosis   . Esophageal stricture   . Fibromyalgia   . Fibromyalgia   . GERD (gastroesophageal reflux disease)   . Hiatal hernia   . Hypertension    pt not taking prescribed bp med  . Seasonal allergies    sinus infection   Past Surgical History:  Procedure Laterality Date  . COLONOSCOPY    . ESOPHAGOGASTRODUODENOSCOPY     dysphagia  . ESOPHAGOGASTRODUODENOSCOPY N/A 02/26/2016   Procedure: ESOPHAGOGASTRODUODENOSCOPY (EGD);  Surgeon: Milus Banister, MD;  Location: [facility];  Service: Endoscopy;  Laterality: N/A;     A IV Location/Drains/Wounds Patient Lines/Drains/Airways Status    Active Line/Drains/Airways    Name:   Placement date:   Placement time:   Site:   Days:   Peripheral IV 10/07/19 Left Antecubital   10/07/19    0705    Antecubital   less than 1          Intake/Output Last 24 hours  Intake/Output Summary (Last 24 hours) at 10/07/2019 1515 Last data filed at 10/07/2019 1511 Gross per 24 hour  Intake 84.65 ml  Output -  Net 84.65 ml    Labs/Imaging Results for orders placed or performed during the hospital encounter of 10/07/19 (from the past 48 hour(s))  Basic metabolic panel     Status: Abnormal   Collection Time: 10/07/19  7:30 AM  Result Value Ref Range   Sodium 140 135 - 145 mmol/L   Potassium 3.4 (L) 3.5 - 5.1 mmol/L   Chloride 104 98 - 111 mmol/L   CO2 23 22 - 32 mmol/L   Glucose, Bld 176 (H) 70 - 99 mg/dL   BUN 13 8 - 23 mg/dL   Creatinine, Ser 0.90 0.44 - 1.00 mg/dL   Calcium 8.8 (L) 8.9 - 10.3 mg/dL   GFR calc non Af Amer >60 >60 mL/min   GFR calc Af Amer >60 >60 mL/min   Anion gap 13 5 - 15    Comment: Performed at [facility], [place] [address] [place],  [address]  CBC     Status: None   Collection Time:  10/07/19  7:30 AM  Result Value Ref Range   WBC 8.9 4.0 - 10.5 K/uL   RBC 4.87 3.87 - 5.11 MIL/uL   Hemoglobin 14.7 12.0 - 15.0 g/dL   HCT 44.6 36.0 - 46.0 %   MCV 91.6 80.0 - 100.0 fL   MCH 30.2 26.0 - 34.0 pg   MCHC 33.0 30.0 - 36.0 g/dL   RDW 13.3 11.5 - 15.5 %   Platelets 231 150 - 400 K/uL   nRBC 0.0 0.0 - 0.2 %    Comment: Performed at [facility], [place] [address] [place], [place] [address]  Troponin I (High Sensitivity)     Status: None   Collection Time: 10/07/19  7:30 AM  Result Value Ref Range   Troponin I (High Sensitivity) 4 <18 ng/L    Comment: (NOTE) Elevated high sensitivity troponin I (hsTnI) values and significant  changes across serial measurements may suggest ACS but many other  chronic and acute conditions are known to elevate hsTnI results.  Refer to the "Links" section for  chest pain algorithms and additional  guidance. Performed at [facility], [place] [address] [place], [place] [address]   Troponin I (High Sensitivity)     Status: None   Collection Time: 10/07/19  9:21 AM  Result Value Ref Range   Troponin I (High Sensitivity) 3 <18 ng/L    Comment: (NOTE) Elevated high sensitivity troponin I (hsTnI) values and significant  changes across serial measurements may suggest ACS but many other  chronic and acute conditions are known to elevate hsTnI results.  Refer to the "Links" section for chest pain algorithms and additional  guidance. Performed at [facility], [place] [address], [place], [place] [address]    Dg Chest Port 1 View  Result Date: 10/07/2019 CLINICAL DATA:  Chest pain today. EXAM: PORTABLE CHEST 1 VIEW COMPARISON:  None. FINDINGS: The aorta is tortuous. The heart size is normal. There is mild opacity of the medial right lung base. There is no pleural effusion or pulmonary edema. Bony structures are normal. IMPRESSION: Mild patchy opacity of medial right lung base, developing pneumonia is not excluded. Electronically Signed   By: Abelardo Diesel M.D.   On: 10/07/2019 08:29   Ct Angio Chest/abd/pel For Dissection W And/or Wo Contrast  Result Date: 10/07/2019 CLINICAL DATA:  Back pain, acute chest pain. EXAM: CT ANGIOGRAPHY CHEST, ABDOMEN AND PELVIS TECHNIQUE: Multidetector CT imaging through the chest, abdomen and pelvis was performed using the standard protocol during bolus administration of intravenous contrast. Multiplanar reconstructed images and MIPs were obtained and reviewed to evaluate the vascular anatomy. CONTRAST:  136mL OMNIPAQUE IOHEXOL 350 MG/ML SOLN COMPARISON:  None. FINDINGS: CTA CHEST FINDINGS Cardiovascular: Non IV contrast images demonstrates a subtle high attenuation rim surrounding the transverse and descending aorta (image 26/4 for example and image 16/4). On contrast phase series, this corresponds to a nonenhancing  thickened aortic wall measuring 7 mm through the arch (image 21/7 and 6 mm in the descending aorta. Findings are concerning for a long segment of intramural hematoma. Circumferential hematoma begins in the proximal transverse arch and extends to the distal descending arch above the diaphragm. There is a potential penetrating ulcer several cm above the diaphragm along the medial aspect of the aorta (10 o'clock) image 56/7). This potential penetrating ulcer measures 1 cm x 0.6 cm. There is no aneurysm in the ascending, transverse descending thoracic aorta. No dissection flap identified. Great vessels are normal without evidence  of dissection or aneurysm. No occlusion. No pericardial effusion. Coronary arteries grossly normal. No pulmonary embolism. Mediastinum/Nodes: No axillary or supraclavicular adenopathy. No mediastinal hilar adenopathy. Lungs/Pleura: Mild basilar atelectasis. No pulmonary infarction. Pneumonia Musculoskeletal: No aggressive osseous lesion. Review of the MIP images confirms the above findings. CTA ABDOMEN AND PELVIS FINDINGS VASCULAR Aorta: Abdominal aorta is normal caliber. Celiac: Widely patent.  Some calcification at the ostia SMA: Widely patent.  Mild calcification at the ostia. Renals: Widely patent single renal arteries. Small/3 renal artery on the RIGHT. Kidneys enhance symmetrically IMA: Patent Inflow: Normal Veins: Normal Review of the MIP images confirms the above findings. NON-VASCULAR Hepatobiliary: No focal hepatic lesion. No biliary duct dilatation. Gallbladder is normal. Common bile duct is normal. Pancreas: Pancreas is normal. No ductal dilatation. No pancreatic inflammation. Spleen: Normal spleen Adrenals/urinary tract: Adrenal glands and kidneys are normal. The ureters and bladder normal. Stomach/Bowel: Small hiatal hernia. Stomach and small bowel normal. Appendix normal. Diverticula sigmoid colon without acute inflammation. Rectum normal. /Lymphatic: No lymphadenopathy.  Reproductive: Uterus and ovaries normal Other: No free fluid. Musculoskeletal: No aggressive osseous lesion. Review of the MIP images confirms the above findings. IMPRESSION: Chest Impression: 1. Long segment of circumferential aortic wall thickening extending from the proximal transverse arch to the distal descending thoracic aorta. Differential includes INTRAMURAL HEMATOMA versus thrombosed TYPE B AORTIC DISSECTION. Favor INTRAMURAL HEMATOMA as a potential PENETRATING ULCER found along the descending thoracic aorta. 1. Brachycephalic vessels are normal.  Aortic root is normal. Abdomen / Pelvis Impression: 1. Abdominal aorta and its branches are normal. No evidence of dissection in the abdominal aorta. 2.  Aortic Atherosclerosis (ICD10-I70.0). Findings conveyed toSTEPHEN KOHUT on 10/07/2019  at11:18. Electronically Signed   By: Suzy Bouchard M.D.   On: 10/07/2019 11:20    Pending Labs Unresulted Labs (From admission, onward)    Start     Ordered   10/08/19 0500  CBC  Tomorrow morning,   R     10/07/19 1251   10/08/19 [id number]  Basic metabolic panel  Tomorrow morning,   R     10/07/19 1251   10/08/19 0500  Magnesium  Tomorrow morning,   R     10/07/19 1251   10/08/19 0500  Phosphorus  Tomorrow morning,   R     10/07/19 1251   10/07/19 1242  SARS Coronavirus 2 by RT PCR (hospital order, performed in [facility] hospital lab) Nasopharyngeal Nasopharyngeal Swab  (Symptomatic/High Risk/Tier 1)  Once,   STAT    Question Answer Comment  Is this test for diagnosis or screening Diagnosis of ill patient   Symptomatic for COVID-19 as defined by CDC Yes   Date of Symptom Onset 10/07/2019   Hospitalized for COVID-19 Yes   Admitted to ICU for COVID-19 No   Previously tested for COVID-19 Yes   Resident in a congregate (group) care setting No   Employed in healthcare setting No   Pregnant No      10/07/19 1242   10/07/19 1006  Lipase, blood  ONCE - STAT,   STAT     10/07/19 1005           Vitals/Pain Today's Vitals   10/07/19 1430 10/07/19 1445 10/07/19 1500 10/07/19 1508  BP: 134/85 (!) 141/88 136/88   Pulse: 66 72 66   Resp: (!) 21 15 16    Temp:      TempSrc:      SpO2: 100% 100% 100%   Weight:      Height:  PainSc:    5     Isolation Precautions No active isolations  Medications Medications  esmolol (BREVIBLOC) 2000 mg / 100 mL (20 mg/mL) infusion (175 mcg/kg/min  76 kg Intravenous Rate/Dose Verify 10/07/19 1511)  acetaminophen (TYLENOL) tablet 650 mg (has no administration in time range)  ondansetron (ZOFRAN) injection 4 mg (has no administration in time range)  pantoprazole (PROTONIX) EC tablet 40 mg (has no administration in time range)  morphine 2 MG/ML injection 1-2 mg (2 mg Intravenous Given 10/07/19 1409)  hydrALAZINE (APRESOLINE) injection 10-20 mg (has no administration in time range)  sodium chloride flush (NS) 0.9 % injection 3 mL (3 mLs Intravenous Given 10/07/19 0735)  fentaNYL (SUBLIMAZE) injection 50 mcg (50 mcg Intravenous Given 10/07/19 0806)  HYDROmorphone (DILAUDID) injection 0.5 mg (0.5 mg Intravenous Given 10/07/19 0916)  haloperidol lactate (HALDOL) injection 2 mg (2 mg Intravenous Given 10/07/19 1030)  iohexol (OMNIPAQUE) 350 MG/ML injection 100 mL (100 mLs Intravenous Contrast Given 10/07/19 1031)  morphine 2 MG/ML injection 0.5 mg (0.5 mg Intravenous Given 10/07/19 1313)    Mobility walks Low fall risk   Focused Assessments Cardiac Assessment Handoff:  Cardiac Rhythm: Normal sinus rhythm No results found for: CKTOTAL, CKMB, CKMBINDEX, TROPONINI No results found for: DDIMER Does the Patient currently have chest pain? Yes      R Recommendations: See Admitting Provider Note  Report given to:   Additional Notes:

## 2019-10-07 NOTE — ED Notes (Signed)
Patient requests pain medication, physician will be consulted

## 2019-10-07 NOTE — Progress Notes (Signed)
Patient had been complaining of inability to void using purewick. CCM contacted regarding mobility orders. CCM advised strict bedrest and bladder scan followed by in and out cath if volume >450. Bladder scan completed, measured  837mL. In and out cath completed, 1,051mL out.   Terie Purser, RN

## 2019-10-07 NOTE — Consult Note (Addendum)
Referring Physician: [facility]  Patient name: Melanie Nash MRN: [id number] DOB: 1946-01-29 Sex: female  REASON FOR CONSULT: aortic intramural hematoma  HPI: Melanie Nash is a 73 y.o. female, who had sudden onset of left-sided chest pain underneath her left breast with radiation to the back approximately 6 AM this morning.  The pain has persisted although it is slightly improved with pain medicine in the emergency room.  The patient had a CT scan of the chest abdomen pelvis in the emergency room which shows a intramural hematoma from the left subclavian down to the mid descending thoracic aorta.  Patient does have a history of hypertension.  Other medical problems include fibromyalgia and reflux otherwise she is fairly healthy.  She has never had any significant operations.  Past Medical History:  Diagnosis Date  . Allergy   . Arthritis   . Diverticulosis   . Esophageal stricture   . Fibromyalgia   . Fibromyalgia   . GERD (gastroesophageal reflux disease)   . Hiatal hernia   . Hypertension    pt not taking prescribed bp med  . Seasonal allergies    sinus infection   Past Surgical History:  Procedure Laterality Date  . COLONOSCOPY    . ESOPHAGOGASTRODUODENOSCOPY     dysphagia  . ESOPHAGOGASTRODUODENOSCOPY N/A 02/26/2016   Procedure: ESOPHAGOGASTRODUODENOSCOPY (EGD);  Surgeon: Milus Banister, MD;  Location: [facility];  Service: Endoscopy;  Laterality: N/A;    Family History  Problem Relation Age of Onset  . Heart disease Mother   . Diabetes Sister   . Diabetes Maternal Uncle   . AAA (abdominal aortic aneurysm) Father   . Colon cancer Neg Hx   . Stomach cancer Neg Hx     SOCIAL HISTORY: Social History   Socioeconomic History  . Marital status: Married    Spouse name: Not on file  . Number of children: 2  . Years of education: Not on file  . Highest education level: Not on file  Occupational History  . Occupation: Engineer, agricultural Needs  . Financial resource  strain: Not on file  . Food insecurity    Worry: Not on file    Inability: Not on file  . Transportation needs    Medical: Not on file    Non-medical: Not on file  Tobacco Use  . Smoking status: Never Smoker  . Smokeless tobacco: Never Used  Substance and Sexual Activity  . Alcohol use: No    Alcohol/week: 0.0 standard drinks  . Drug use: No  . Sexual activity: Not on file  Lifestyle  . Physical activity    Days per week: Not on file    Minutes per session: Not on file  . Stress: Not on file  Relationships  . Social Herbalist on phone: Not on file    Gets together: Not on file    Attends religious service: Not on file    Active member of club or organization: Not on file    Attends meetings of clubs or organizations: Not on file    Relationship status: Not on file  . Intimate partner violence    Fear of current or ex partner: Not on file    Emotionally abused: Not on file    Physically abused: Not on file    Forced sexual activity: Not on file  Other Topics Concern  . Not on file  Social History Narrative  . Not on file  No Known Allergies  Current Facility-Administered Medications  Medication Dose Route Frequency Provider Last Rate Last Dose  . 0.9 %  sodium chloride infusion  500 mL Intravenous Once Milus Banister, MD      . esmolol (BREVIBLOC) 2000 mg / 100 mL (20 mg/mL) infusion  25-300 mcg/kg/min Intravenous Continuous Virgel Manifold, MD 11.4 mL/hr at 10/07/19 1157 50 mcg/kg/min at 10/07/19 1157  . fentaNYL (SUBLIMAZE) injection 50 mcg  50 mcg Intravenous Q2H PRN Virgel Manifold, MD   50 mcg at 10/07/19 1151   Current Outpatient Medications  Medication Sig Dispense Refill  . acetaminophen (TYLENOL) 500 MG tablet Take 1 tablet (500 mg total) by mouth every 6 (six) hours as needed for mild pain (or Fever >/= 101). 30 tablet 0  . famotidine (PEPCID) 40 MG tablet Take 1 tablet by mouth every morning. 30 tablet 11  . NON FORMULARY OTC for anxiety    .  omeprazole (PRILOSEC) 20 MG capsule TAKE 1 CAPSULE BY MOUTH EVERY NIGHT AT BEDTIME' 30 capsule 0  . Probiotic Product (PROBIOTIC DAILY PO) Take by mouth.    . vitamin C (ASCORBIC ACID) 500 MG tablet Take 500 mg by mouth daily.      ROS:   General:  No weight loss, Fever, chills  HEENT: No recent headaches, no nasal bleeding, no visual changes, no sore throat  Neurologic: No dizziness, blackouts, seizures. No recent symptoms of stroke or mini- stroke. No recent episodes of slurred speech, or temporary blindness.  Cardiac: + recent episodes of chest pain/pressure, no shortness of breath at rest.  No shortness of breath with exertion.  Denies history of atrial fibrillation or irregular heartbeat  Vascular: No history of rest pain in feet.  No history of claudication.  No history of non-healing ulcer, No history of DVT   Pulmonary: No home oxygen, no productive cough, no hemoptysis,  No asthma or wheezing  Musculoskeletal:  [ ]  Arthritis, [ ]  Low back pain,  [ ]  Joint pain  Hematologic:No history of hypercoagulable state.  No history of easy bleeding.  No history of anemia  Gastrointestinal: No hematochezia or melena,  No gastroesophageal reflux, no trouble swallowing  Urinary: [ ]  chronic Kidney disease, [ ]  on HD - [ ]  MWF or [ ]  TTHS, [ ]  Burning with urination, [ ]  Frequent urination, [ ]  Difficulty urinating;   Skin: No rashes  Psychological: No history of anxiety,  No history of depression   Physical Examination  Vitals:   10/07/19 0930 10/07/19 0945 10/07/19 1045 10/07/19 1100  BP: 137/81 134/90 (!) 147/90 (!) 153/88  Pulse: 61 (!) 59 77 69  Resp: 17 16 14 17   Temp:      TempSrc:      SpO2: 99% 99% 99% 98%  Weight:      Height:        Body mass index is 30.65 kg/m.  General:  Alert and oriented, no acute distress HEENT: Normal Neck: No bruit or JVD Pulmonary: Clear to auscultation bilaterally Cardiac: Regular Rate and Rhythm without murmur Abdomen: Soft,  non-tender, non-distended, no mass, no scars Skin: No rash Extremity Pulses:  2+ radial, brachial, femoral, dorsalis pedis, posterior tibial pulses bilaterally Musculoskeletal: No deformity or edema  Neurologic: Upper and lower extremity motor 5/5 and symmetric  DATA:  I reviewed the images the patient's CT scan of the chest abdomen and pelvis.  This shows intramural hematoma surrounding the aorta adjacent to the left subclavian.  There is a possible  penetrating aortic ulcer just distal to the left subclavian.  Aortic diameter in this location is 3.7 cm.  The intramural hematoma then resolves before entering the abdomen.  There is no endorgan compromise or extension into the mesenteric renal vessels.  There is no compromise of the iliac vessels.  The infrarenal abdominal aorta is normal diameter.  CBC    Component Value Date/Time   WBC 8.9 10/07/2019 0730   RBC 4.87 10/07/2019 0730   HGB 14.7 10/07/2019 0730   HCT 44.6 10/07/2019 0730   PLT 231 10/07/2019 0730   MCV 91.6 10/07/2019 0730   MCH 30.2 10/07/2019 0730   MCHC 33.0 10/07/2019 0730   RDW 13.3 10/07/2019 0730   LYMPHSABS 2.5 02/26/2016 1725   MONOABS 0.6 02/26/2016 1725   EOSABS 0.2 02/26/2016 1725   BASOSABS 0.0 02/26/2016 1725    BMET    Component Value Date/Time   NA 140 10/07/2019 0730   K 3.4 (L) 10/07/2019 0730   CL 104 10/07/2019 0730   CO2 23 10/07/2019 0730   GLUCOSE 176 (H) 10/07/2019 0730   BUN 13 10/07/2019 0730   CREATININE 0.90 10/07/2019 0730   CALCIUM 8.8 (L) 10/07/2019 0730   GFRNONAA >60 10/07/2019 0730   GFRAA >60 10/07/2019 0730   Troponin 3  ASSESSMENT: Type B aortic dissection/intramural hematoma currently without AV evidence of endorgan compromise rupture or significant dilation of the aorta   PLAN: #1 would consult the critical care service for admission and blood pressure management in the ICU.  2.  We will continue to follow as a consult and monitor for any evidence of expansion of the  aorta or endorgan damage.  The patient will need a repeat CT scan of the chest abdomen and pelvis prior to discharge from the hospital.  Pathophysiology of aortic dissection and overall plan and management was discussed with the patient and her husband at the bedside.   Ruta Hinds, MD [facility] Office: [phone] Pager: [phone]

## 2019-10-07 NOTE — H&P (Signed)
NAME:  Melanie Nash, MRN:  [id number], DOB:  Feb 18, 1946, LOS: 0 ADMISSION DATE:  10/07/2019, CONSULTATION DATE:  10/07/2019 REFERRING MD:  Dr. Wilson Singer, CHIEF COMPLAINT:  Chest pain    Brief History   73yo F with history of hypertension self discontinued meds presented with type B aortic dissection.  History of present illness   Admitted with significant left sided chest pain that radiated to back with associated bilateral arm tingling that woke patient from sleep at approximately 6am. The pain is constant with no real alleviating or aggravating factors. Patient reports history of hypertension but self discontinued antihypertensives as she "felt I no longer needed them, my numbers have been fine". Also reports history of esophageal strictures requiring multiple dilations in the past, last dilation 01/2019,  She denies any other symptom including fever, chills, shortness of breath, or lower extremity swelling.  Workup on admission; mild hypokalemia, hyperglycemia, and hypertensive BP 153/88. CT angio chest with long segment of circumferential aortic wall thickening extending from the proximal transvers arch to the distal descending thoracic aorta.  PCCM called for admission given need for close monitoring and tight blood pressure control.   Past Medical History  Hypertension GERD Hiatal hernia Fibromyalgia Esophageal strictures staus post multiple dilations Diverticulosis   Significant Hospital Events   10/9 Admitted  Consults:  Vascular surgery   Procedures:    Significant Diagnostic Tests:  CT angio chest 10/9 >> Long segment of circumferential aortic wall thickening extending from the proximal transvers arch to the distal descending thoracic aorta  Micro Data:  COVID 10/9 >>   Antimicrobials:    Interim history/subjective:  Lying in bed with continued chest pain and slight lethargy. Reports chest pain has persisted with little to no relief since it began. Husband and daughter  at bedside and updated on plan as well.   Objective   Blood pressure (!) 160/96, pulse 78, temperature (!) 97.4 F (36.3 C), temperature source Oral, resp. rate 19, height 5\' 2"  (1.575 m), weight 76 kg, SpO2 99 %.       No intake or output data in the 24 hours ending 10/07/19 1253 Filed Weights   10/07/19 0721  Weight: 76 kg    Examination: General: Elderly female lying in bed in mild discomfort, in NAD HEENT: MM pink/moist, PERRL,  Neuro: Alert and oriented, able to follow all commands and answer all questions appropriately  CV: s1s2 regular rate and rhythm, no murmur, rubs, or gallops,  PULM:  Clear to ascultation bilaterally, no increased work of breathing  GI: soft, bowel sounds active in all 4 quadrants, non-tender, non-distended Extremities: warm/dry, palpable pules to bilateral lower extremities, no edema  Skin: no rashes or lesions  Resolved Hospital Problem list     Assessment & Plan:  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 Patient and family also endorse significant amounts of increased stress lately  P: Esmolol drip with SBP goal of 110-120 Consider initiation of oral antihypertensive as well Vascular surgery consulted, appreciate assistance  Close monitoring of blood pressure  Continuous telemetry Strict bedrest  Neurovascular checks  Per Vascular surgery will need CT scan chest abdomen and pelvis prior to discharge  Pain control  GERD  P: PPI  Best practice:  Diet: NPO Pain/Anxiety/Delirium protocol (if indicated): PRN morphine VAP protocol (if indicated): N/A DVT prophylaxis: SCD GI prophylaxis: PPI Glucose control: Monitor  Mobility: Bedrest  Code Status:  LCB Family Communication: Spouse and daughter updated of plan at bedside  Disposition: ICU  Labs   CBC: Recent Labs  Lab 10/07/19 0730  WBC 8.9   HGB 14.7  HCT 44.6  MCV 91.6  PLT [id number]    Basic Metabolic Panel: Recent Labs  Lab 10/07/19 0730  NA 140  K 3.4*  CL 104  CO2 23  GLUCOSE 176*  BUN 13  CREATININE 0.90  CALCIUM 8.8*   GFR: Estimated Creatinine Clearance: 53.2 mL/min (by C-G formula based on SCr of 0.9 mg/dL). Recent Labs  Lab 10/07/19 0730  WBC 8.9    Liver Function Tests: No results for input(s): AST, ALT, ALKPHOS, BILITOT, PROT, ALBUMIN in the last 168 hours. No results for input(s): LIPASE, AMYLASE in the last 168 hours. No results for input(s): AMMONIA in the last 168 hours.  ABG    Component Value Date/Time   TCO2 26 02/26/2016 1738     Coagulation Profile: No results for input(s): INR, PROTIME in the last 168 hours.  Cardiac Enzymes: No results for input(s): CKTOTAL, CKMB, CKMBINDEX, TROPONINI in the last 168 hours.  HbA1C: No results found for: HGBA1C  CBG: No results for input(s): GLUCAP in the last 168 hours.  Review of Systems: Positive in bold  Gen: Denies fever, chills, weight change, fatigue, night sweats HEENT: Denies blurred vision, double vision, hearing loss, tinnitus, sinus congestion, rhinorrhea, sore throat, neck stiffness, dysphagia PULM: Denies shortness of breath, cough, sputum production, hemoptysis, wheezing CV: Denies chest pain radiating to back, edema, orthopnea, paroxysmal nocturnal dyspnea, palpitations GI: Denies abdominal pain, nausea, vomiting, diarrhea, hematochezia, melena, constipation, change in bowel habits GU: Denies dysuria, hematuria, polyuria, oliguria, urethral discharge Endocrine: Denies hot or cold intolerance, polyuria, polyphagia or appetite change Derm: Denies rash, dry skin, scaling or peeling skin change Heme: Denies easy bruising, bleeding, bleeding gums Neuro: Denies headache, numbness, weakness, slurred speech, loss of memory or consciousness  Past Medical History  She,  has a past medical history of Allergy, Arthritis, Diverticulosis,  Esophageal stricture, Fibromyalgia, Fibromyalgia, GERD (gastroesophageal reflux disease), Hiatal hernia, Hypertension, and Seasonal allergies.   Surgical History    Past Surgical History:  Procedure Laterality Date  . COLONOSCOPY    . ESOPHAGOGASTRODUODENOSCOPY     dysphagia  . ESOPHAGOGASTRODUODENOSCOPY N/A 02/26/2016   Procedure: ESOPHAGOGASTRODUODENOSCOPY (EGD);  Surgeon: Milus Banister, MD;  Location: [facility];  Service: Endoscopy;  Laterality: N/A;     Social History   reports that she has never smoked. She has never used smokeless tobacco. She reports that she does not drink alcohol or use drugs.   Family History   Her family history includes AAA (abdominal aortic aneurysm) in her father; Diabetes in her maternal uncle and sister; Heart disease in her mother. There is no history of Colon cancer or Stomach cancer.   Allergies No Known Allergies   Home Medications  Prior to Admission medications   Medication Sig Start Date End Date Taking? Authorizing Provider  acetaminophen (TYLENOL) 500 MG tablet Take 1 tablet (500 mg total) by mouth every 6 (six) hours as needed for mild pain (or Fever >/= 101). 01/30/17  Yes Regalado, Belkys A, MD  omeprazole (PRILOSEC) 20 MG capsule TAKE 1 CAPSULE BY MOUTH EVERY NIGHT AT BEDTIME' Patient taking differently: Take 20 mg by mouth at bedtime.  09/26/19  Yes Milus Banister, MD  famotidine (PEPCID) 40 MG tablet Take 1 tablet by mouth every morning. Patient not taking: Reported on 10/07/2019 12/28/18   Esterwood,  Amy S, PA-C     Critical care time:     CRITICAL CARE Performed by: Johnsie Cancel   Total critical care time: 40 minutes  Critical care time was exclusive of separately billable procedures and treating other patients.  Critical care was necessary to treat or prevent imminent or life-threatening deterioration.  Critical care was time spent personally by me on the following activities: development of treatment plan with  patient and/or surrogate as well as nursing, discussions with consultants, evaluation of patient's response to treatment, examination of patient, obtaining history from patient or surrogate, ordering and performing treatments and interventions, ordering and review of laboratory studies, ordering and review of radiographic studies, pulse oximetry and re-evaluation of patient's condition.  Johnsie Cancel, NP-C [facility] Pgr: [phone] or if no answer [phone] 10/07/2019, 2:20 PM'

## 2019-10-07 NOTE — ED Notes (Signed)
[phone] Abigail Butts, daughter [phone] Lovena Le, granddaughter   Password [phone] to be set up in [place] for ICU admit

## 2019-10-07 NOTE — ED Provider Notes (Signed)
[facility] EMERGENCY DEPARTMENT Provider Note   CSN: [id number] Arrival date & time: 10/07/19  0715     History   Chief Complaint Chief Complaint  Patient presents with   Chest Pain    HPI Melanie Nash is a 73 y.o. female.     HPI   73 year old female with chest pain.  Onset this morning.  She states that she went to sleep in her usual state of health.  She was woken up with pain in the center of her chest.  Radiation to her back.  Made her feel short of breath.  Pain is been constant since onset although improved somewhat currently.  She felt that initially she cannot breathe or move because of how severe the pain was.  She is given nitroglycerin and aspirin by EMS without appreciable change.  She denies any past history of cardiac disease that she is aware of.  Past Medical History:  Diagnosis Date   Allergy    Arthritis    Diverticulosis    Esophageal stricture    Fibromyalgia    Fibromyalgia    GERD (gastroesophageal reflux disease)    Hiatal hernia    Hypertension    pt not taking prescribed bp med   Seasonal allergies    sinus infection    Patient Active Problem List   Diagnosis Date Noted   Diverticulitis 01/25/2017   Abnormal liver function 01/25/2017   Adnexal mass 01/25/2017   Diverticulitis of large intestine with perforation without bleeding     Past Surgical History:  Procedure Laterality Date   COLONOSCOPY     ESOPHAGOGASTRODUODENOSCOPY     dysphagia   ESOPHAGOGASTRODUODENOSCOPY N/A 02/26/2016   Procedure: ESOPHAGOGASTRODUODENOSCOPY (EGD);  Surgeon: Milus Banister, MD;  Location: [facility];  Service: Endoscopy;  Laterality: N/A;     OB History   No obstetric history on file.      Home Medications    Prior to Admission medications   Medication Sig Start Date End Date Taking? Authorizing Provider  acetaminophen (TYLENOL) 500 MG tablet Take 1 tablet (500 mg total) by mouth every 6 (six) hours as  needed for mild pain (or Fever >/= 101). 01/30/17   Regalado, Belkys A, MD  famotidine (PEPCID) 40 MG tablet Take 1 tablet by mouth every morning. 12/28/18   Esterwood, Amy S, PA-C  NON FORMULARY OTC for anxiety    [provider]  omeprazole (PRILOSEC) 20 MG capsule TAKE 1 CAPSULE BY MOUTH EVERY NIGHT AT BEDTIME' 09/26/19   Milus Banister, MD  Probiotic Product (PROBIOTIC DAILY PO) Take by mouth.    [provider]  vitamin C (ASCORBIC ACID) 500 MG tablet Take 500 mg by mouth daily.    [provider]    Family History Family History  Problem Relation Age of Onset   Heart disease Mother    Diabetes Sister    Diabetes Maternal Uncle    AAA (abdominal aortic aneurysm) Father    Colon cancer Neg Hx    Stomach cancer Neg Hx    Social History Social History   Tobacco Use   Smoking status: Never Smoker   Smokeless tobacco: Never Used  Substance Use Topics   Alcohol use: No    Alcohol/week: 0.0 standard drinks   Drug use: No    Allergies   Patient has no known allergies.  Review of Systems Review of Systems  All systems reviewed and negative, other than as noted in HPI.  Physical  Exam Updated Vital Signs BP 134/90    Pulse (!) 59    Temp (!) 97.4 F (36.3 C) (Oral)    Resp 16    Ht 5\' 2"  (1.575 m)    Wt 76 kg    SpO2 99%    BMI 30.65 kg/m   Physical Exam Vitals signs and nursing note reviewed.  Constitutional:      General: She is not in acute distress.    Appearance: She is well-developed.  HENT:     Head: Normocephalic and atraumatic.  Eyes:     General:        Right eye: No discharge.        Left eye: No discharge.     Conjunctiva/sclera: Conjunctivae normal.  Neck:     Musculoskeletal: Neck supple.  Cardiovascular:     Rate and Rhythm: Normal rate and regular rhythm.     Heart sounds: Normal heart sounds. No murmur. No friction rub. No gallop.   Pulmonary:     Effort: Pulmonary effort is normal. No respiratory distress.       Breath sounds: Normal breath sounds.  Abdominal:     General: There is no distension.     Palpations: Abdomen is soft.     Tenderness: There is no abdominal tenderness.  Musculoskeletal:        General: No tenderness.  Skin:    General: Skin is warm and dry.  Neurological:     Mental Status: She is alert.  Psychiatric:        Behavior: Behavior normal.        Thought Content: Thought content normal.      ED Treatments / Results  Labs (all labs ordered are listed, but only abnormal results are displayed) Labs Reviewed  BASIC METABOLIC PANEL - Abnormal; Notable for the following components:      Result Value   Potassium 3.4 (*)    Glucose, Bld 176 (*)    Calcium 8.8 (*)    All other components within normal limits  SARS CORONAVIRUS 2 (TAT 6-24 HRS)  CBC  LIPASE, BLOOD  TROPONIN I (HIGH SENSITIVITY)  TROPONIN I (HIGH SENSITIVITY)    EKG EKG Interpretation  Date/Time:  Friday October 07 2019 07:22:26 EDT Ventricular Rate:  64 PR Interval:    QRS Duration: 88 QT Interval:  438 QTC Calculation: 452 R Axis:   38 Text Interpretation:  Sinus rhythm Non-specific ST-t changes Confirmed by Virgel Manifold [phone]) on 10/07/2019 7:32:45 AM   Radiology Dg Chest Port 1 View  Result Date: 10/07/2019 CLINICAL DATA:  Chest pain today. EXAM: PORTABLE CHEST 1 VIEW COMPARISON:  None. FINDINGS: The aorta is tortuous. The heart size is normal. There is mild opacity of the medial right lung base. There is no pleural effusion or pulmonary edema. Bony structures are normal. IMPRESSION: Mild patchy opacity of medial right lung base, developing pneumonia is not excluded. Electronically Signed   By: Abelardo Diesel M.D.   On: 10/07/2019 08:29   Ct Angio Chest/abd/pel For Dissection W And/or Wo Contrast  Result Date: 10/07/2019 CLINICAL DATA:  Back pain, acute chest pain. EXAM: CT ANGIOGRAPHY CHEST, ABDOMEN AND PELVIS TECHNIQUE: Multidetector CT imaging through the chest, abdomen and pelvis  was performed using the standard protocol during bolus administration of intravenous contrast. Multiplanar reconstructed images and MIPs were obtained and reviewed to evaluate the vascular anatomy. CONTRAST:  12mL OMNIPAQUE IOHEXOL 350 MG/ML SOLN COMPARISON:  None. FINDINGS: CTA CHEST FINDINGS Cardiovascular: Non IV contrast  images demonstrates a subtle high attenuation rim surrounding the transverse and descending aorta (image 26/4 for example and image 16/4). On contrast phase series, this corresponds to a nonenhancing thickened aortic wall measuring 7 mm through the arch (image 21/7 and 6 mm in the descending aorta. Findings are concerning for a long segment of intramural hematoma. Circumferential hematoma begins in the proximal transverse arch and extends to the distal descending arch above the diaphragm. There is a potential penetrating ulcer several cm above the diaphragm along the medial aspect of the aorta (10 o'clock) image 56/7). This potential penetrating ulcer measures 1 cm x 0.6 cm. There is no aneurysm in the ascending, transverse descending thoracic aorta. No dissection flap identified. Great vessels are normal without evidence of dissection or aneurysm. No occlusion. No pericardial effusion. Coronary arteries grossly normal. No pulmonary embolism. Mediastinum/Nodes: No axillary or supraclavicular adenopathy. No mediastinal hilar adenopathy. Lungs/Pleura: Mild basilar atelectasis. No pulmonary infarction. Pneumonia Musculoskeletal: No aggressive osseous lesion. Review of the MIP images confirms the above findings. CTA ABDOMEN AND PELVIS FINDINGS VASCULAR Aorta: Abdominal aorta is normal caliber. Celiac: Widely patent.  Some calcification at the ostia SMA: Widely patent.  Mild calcification at the ostia. Renals: Widely patent single renal arteries. Small/3 renal artery on the RIGHT. Kidneys enhance symmetrically IMA: Patent Inflow: Normal Veins: Normal Review of the MIP images confirms the above  findings. NON-VASCULAR Hepatobiliary: No focal hepatic lesion. No biliary duct dilatation. Gallbladder is normal. Common bile duct is normal. Pancreas: Pancreas is normal. No ductal dilatation. No pancreatic inflammation. Spleen: Normal spleen Adrenals/urinary tract: Adrenal glands and kidneys are normal. The ureters and bladder normal. Stomach/Bowel: Small hiatal hernia. Stomach and small bowel normal. Appendix normal. Diverticula sigmoid colon without acute inflammation. Rectum normal. /Lymphatic: No lymphadenopathy. Reproductive: Uterus and ovaries normal Other: No free fluid. Musculoskeletal: No aggressive osseous lesion. Review of the MIP images confirms the above findings. IMPRESSION: Chest Impression: 1. Long segment of circumferential aortic wall thickening extending from the proximal transverse arch to the distal descending thoracic aorta. Differential includes INTRAMURAL HEMATOMA versus thrombosed TYPE B AORTIC DISSECTION. Favor INTRAMURAL HEMATOMA as a potential PENETRATING ULCER found along the descending thoracic aorta. 1. Brachycephalic vessels are normal.  Aortic root is normal. Abdomen / Pelvis Impression: 1. Abdominal aorta and its branches are normal. No evidence of dissection in the abdominal aorta. 2.  Aortic Atherosclerosis (ICD10-I70.0). Findings conveyed toSTEPHEN Laymond Postle on 10/07/2019  at11:18. Electronically Signed   By: Suzy Bouchard M.D.   On: 10/07/2019 11:20    Procedures Procedures (including critical care time)  CRITICAL CARE Performed by: Virgel Manifold Total critical care time: 35 minutes Critical care time was exclusive of separately billable procedures and treating other patients. Critical care was necessary to treat or prevent imminent or life-threatening deterioration. Critical care was time spent personally by me on the following activities: development of treatment plan with patient and/or surrogate as well as nursing, discussions with consultants, evaluation of  patient's response to treatment, examination of patient, obtaining history from patient or surrogate, ordering and performing treatments and interventions, ordering and review of laboratory studies, ordering and review of radiographic studies, pulse oximetry and re-evaluation of patient's condition.   Medications Ordered in ED Medications  esmolol (BREVIBLOC) 2000 mg / 100 mL (20 mg/mL) infusion (25 mcg/kg/min  76 kg Intravenous New Bag/Given 10/07/19 1139)  fentaNYL (SUBLIMAZE) injection 50 mcg (50 mcg Intravenous Given 10/07/19 1151)  sodium chloride flush (NS) 0.9 % injection 3 mL (3 mLs Intravenous Given 10/07/19  [id number])  fentaNYL (SUBLIMAZE) injection 50 mcg (50 mcg Intravenous Given 10/07/19 0806)  HYDROmorphone (DILAUDID) injection 0.5 mg (0.5 mg Intravenous Given 10/07/19 0916)  haloperidol lactate (HALDOL) injection 2 mg (2 mg Intravenous Given 10/07/19 1030)  iohexol (OMNIPAQUE) 350 MG/ML injection 100 mL (100 mLs Intravenous Contrast Given 10/07/19 1031)     Initial Impression / Assessment and Plan / ED Course  I have reviewed the triage vital signs and the nursing notes.  Pertinent labs & imaging results that were available during my care of the patient were reviewed by me and considered in my medical decision making (see chart for details).       73 year old female with chest pain radiating to her back.  CTA does show evidence of type B dissection.  Vascular surgery consulted.  Esmolol started.  Still complaining of pain although improved from the time of arrival.  Of note she does have a history of chronic pain/fibromyalgia.  Final Clinical Impressions(s) / ED Diagnoses   Final diagnoses:  Dissection of thoracic aorta [facility])    ED Discharge Orders    None       Virgel Manifold, MD 10/07/19 1212

## 2019-10-07 NOTE — ED Triage Notes (Addendum)
Patient came in by EMS with a complaint of chest pain that woke her up. Radiates to the back, describes as crushing. Pale, cool, diaphoretic, sob. 324 mg aspirin and four 0.4 mg SL nitro were given and the patient has improved according to EMS.

## 2019-10-07 NOTE — ED Notes (Signed)
Patient transported to CT 

## 2019-10-07 NOTE — ED Notes (Signed)
Blood pressure taken on both arms.  No difference noted.

## 2019-10-08 ENCOUNTER — Inpatient Hospital Stay (HOSPITAL_COMMUNITY): Payer: PPO

## 2019-10-08 DIAGNOSIS — I361 Nonrheumatic tricuspid (valve) insufficiency: Secondary | ICD-10-CM

## 2019-10-08 DIAGNOSIS — I7103 Dissection of thoracoabdominal aorta: Secondary | ICD-10-CM

## 2019-10-08 LAB — BASIC METABOLIC PANEL
Anion gap: 10 (ref 5–15)
BUN: 17 mg/dL (ref 8–23)
CO2: 24 mmol/L (ref 22–32)
Calcium: 8.5 mg/dL — ABNORMAL LOW (ref 8.9–10.3)
Chloride: 105 mmol/L (ref 98–111)
Creatinine, Ser: 0.88 mg/dL (ref 0.44–1.00)
GFR calc Af Amer: >60 mL/min (ref >60)
GFR calc non Af Amer: >60 mL/min (ref >60)
Glucose, Bld: 129 mg/dL — ABNORMAL HIGH (ref 70–99)
Potassium: 4 mmol/L (ref 3.5–5.1)
Sodium: 139 mmol/L (ref 135–145)

## 2019-10-08 LAB — CBC
HCT: 42.5 % (ref 36.0–46.0)
Hemoglobin: 14.1 g/dL (ref 12.0–15.0)
MCH: 29.9 pg (ref 26.0–34.0)
MCHC: 33.2 g/dL (ref 30.0–36.0)
MCV: 90 fL (ref 80.0–100.0)
Platelets: 207 10*3/uL (ref 150–400)
RBC: 4.72 MIL/uL (ref 3.87–5.11)
RDW: 13.7 % (ref 11.5–15.5)
WBC: 13.7 10*3/uL — ABNORMAL HIGH (ref 4.0–10.5)
nRBC: 0 % (ref 0.0–0.2)

## 2019-10-08 LAB — ECHOCARDIOGRAM COMPLETE
Height: 62 in
Weight: 2776.03 oz

## 2019-10-08 LAB — MAGNESIUM: Magnesium: 2.2 mg/dL (ref 1.7–2.4)

## 2019-10-08 LAB — PHOSPHORUS: Phosphorus: 3.8 mg/dL (ref 2.5–4.6)

## 2019-10-08 MED ORDER — CARVEDILOL 3.125 MG PO TABS
3.1250 mg | ORAL_TABLET | Freq: Two times a day (BID) | ORAL | Status: DC
  Administered 2019-10-08 – 2019-10-09 (×3): 3.125 mg via ORAL
  Filled 2019-10-08 (×3): qty 1

## 2019-10-08 NOTE — Progress Notes (Signed)
[facility] Progress Note Patient Name: Melanie Nash DOB: February 11, 1946 MRN: [id number]   Date of Service  10/08/2019  HPI/Events of Note  Request for foley catheter since patient had I/O cath x 3  eICU Interventions  Order placed        Margaretmary Lombard 10/08/2019, 8:06 PM

## 2019-10-08 NOTE — Progress Notes (Signed)
Echocardiogram 2D Echocardiogram has been performed.  Oneal Deputy Ephrem Carrick 10/08/2019, 10:41 AM

## 2019-10-08 NOTE — Progress Notes (Signed)
[facility]  Subjective  - chest pain better   Objective 120/73 78 98.1 F (36.7 C) (Oral) 14 98%  Intake/Output Summary (Last 24 hours) at 10/08/2019 0736 Last data filed at 10/08/2019 0600 Gross per 24 hour  Intake 624.37 ml  Output 1000 ml  Net -375.63 ml   Abdomen soft non tender 2+ DP pulses  Assessment/Planning: Type B dissection/intramural hematoma Currently weaning IV BP meds Hopefully to floor next 1-2 days as oral meds adjusted No end organ compromise or indication for operation currently  Ruta Hinds 10/08/2019 7:36 AM --  Laboratory Lab Results: Recent Labs    10/07/19 0730 10/08/19 0213  WBC 8.9 13.7*  HGB 14.7 14.1  HCT 44.6 42.5  PLT 231 207   BMET Recent Labs    10/07/19 0730 10/08/19 0213  NA 140 139  K 3.4* 4.0  CL 104 105  CO2 23 24  GLUCOSE 176* 129*  BUN 13 17  CREATININE 0.90 0.88  CALCIUM 8.8* 8.5*    COAG No results found for: INR, PROTIME No results found for: PTT

## 2019-10-08 NOTE — Progress Notes (Addendum)
NAME:  Melanie Nash, MRN:  [id number], DOB:  [birth year], LOS: 1 ADMISSION DATE:  10/07/2019, CONSULTATION DATE:  10/07/2019 REFERRING MD:  Dr. Wilson Singer, CHIEF COMPLAINT:  Chest pain    Brief History   73yo F with history of hypertension self discontinued meds presented with type B aortic dissection.  History of present illness   Admitted with significant left sided chest pain that radiated to back with associated bilateral arm tingling that woke patient from sleep at approximately 6am. The pain is constant with no real alleviating or aggravating factors. Patient reports history of hypertension but self discontinued antihypertensives as she "felt I no longer needed them, my numbers have been fine". Also reports history of esophageal strictures requiring multiple dilations in the past, last dilation 01/2019,  She denies any other symptom including fever, chills, shortness of breath, or lower extremity swelling.  Workup on admission; mild hypokalemia, hyperglycemia, and hypertensive BP 153/88. CT angio chest with long segment of circumferential aortic wall thickening extending from the proximal transvers arch to the distal descending thoracic aorta.  PCCM called for admission given need for close monitoring and tight blood pressure control.   Past Medical History  Hypertension GERD Hiatal hernia Fibromyalgia Esophageal strictures staus post multiple dilations Diverticulosis   Significant Hospital Events   10/9 Admitted- esmolol for HR & BP control  Consults:  Vascular surgery   Procedures:    Significant Diagnostic Tests:  CT angio chest 10/9 >> Long segment of circumferential aortic wall thickening extending from the proximal transvers arch to the distal descending thoracic aorta  Micro Data:  COVID 10/9 >>   Antimicrobials:    Interim history/subjective:  This morning she denies complaints.  Her pain is resolved. Additional history obtained- she was prescribed low-dose losartan  PTA, but was not taking this medication for "borderline high BP".  Has not had antibiotics within the past year.  Previous episode of diverticulitis in 2018 was treated with beta lactams.  Her father had an abdominal aortic aneurysm, but no history of dissection.  Objective   Blood pressure 124/72, pulse 81, temperature 97.9 F (36.6 C), resp. rate 15, height 5\' 2"  (1.575 m), weight 78.7 kg, SpO2 97 %.        Intake/Output Summary (Last 24 hours) at 10/08/2019 0846 Last data filed at 10/08/2019 0800 Gross per 24 hour  Intake 744.37 ml  Output 1000 ml  Net -255.63 ml   Filed Weights   10/07/19 0721 10/08/19 0545  Weight: 76 kg 78.7 kg    Examination: General: Elderly woman lying in bed no acute distress HEENT: [place]/AT, eyes anicteric Neuro: Alert and interactive, answering questions appropriately, able to move bilateral lower extremities on command.  No apparent sensory deficits. CV: S1-S2, regular rate and rhythm, no murmurs.  Heart rate mid 90s. PULM: CTA B, breathing comfortably on 2L [place].  No conversational dyspnea GI: Soft, nontender, nondistended  extremities: No cyanosis or clubbing, no LE edema skin: No rashes or wounds.  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 Patient and family also endorse significant amounts of increased stress lately  P: -Starting Coreg 3.125 mg twice daily; no antihypertensives PTA (supposed to be on losartan). -Continue esmolol infusion as required to maintain SBP less than 120, goal heart rate less than 60. -Hydralazine PRN -Continue neurovascular checks -Bedrest -Needs CT scan  chest abdomen pelvis with contrast per vascular surgery prior to discharge -Appreciate vascular surgery's input -Echocardiogram  GERD  P: -Continue PTA PPI  Best  practice:  Diet: NPO Pain/Anxiety/Delirium protocol (if indicated): PRN morphine VAP protocol (if indicated): N/A DVT prophylaxis: SCD GI prophylaxis: PPI Glucose control: Monitor  Mobility: Bedrest  Code Status: LCB Family Communication: Discussed plan with patient. Disposition: ICU  Labs   CBC: Recent Labs  Lab 10/07/19 0730 10/08/19 0213  WBC 8.9 13.7*  HGB 14.7 14.1  HCT 44.6 42.5  MCV 91.6 90.0  PLT 231 [id number]    Basic Metabolic Panel: Recent Labs  Lab 10/07/19 0730 10/08/19 0213  NA 140 139  K 3.4* 4.0  CL 104 105  CO2 23 24  GLUCOSE 176* 129*  BUN 13 17  CREATININE 0.90 0.88  CALCIUM 8.8* 8.5*  MG  --  2.2  PHOS  --  3.8   GFR: Estimated Creatinine Clearance: 55.3 mL/min (by C-G formula based on SCr of 0.88 mg/dL). Recent Labs  Lab 10/07/19 0730 10/08/19 0213  WBC 8.9 13.7*    Liver Function Tests: No results for input(s): AST, ALT, ALKPHOS, BILITOT, PROT, ALBUMIN in the last 168 hours. Recent Labs  Lab 10/07/19 0921  LIPASE 18   No results for input(s): AMMONIA in the last 168 hours.  ABG    Component Value Date/Time   TCO2 26 02/26/2016 1738     Coagulation Profile: No results for input(s): INR, PROTIME in the last 168 hours.  Cardiac Enzymes: No results for input(s): CKTOTAL, CKMB, CKMBINDEX, TROPONINI in the last 168 hours.  HbA1C: No results found for: HGBA1C  CBG: No results for input(s): GLUCAP in the last 168 hours.  Allergies: No Known Allergies   Home Medications  Prior to Admission medications   Medication Sig Start Date End Date Taking? Authorizing Provider  acetaminophen (TYLENOL) 500 MG tablet Take 1 tablet (500 mg total) by mouth every 6 (six) hours as needed for mild pain (or Fever >/= 101). 01/30/17  Yes Regalado, Belkys A, MD  omeprazole (PRILOSEC) 20 MG capsule TAKE 1 CAPSULE BY MOUTH EVERY NIGHT AT BEDTIME' Patient taking differently: Take 20 mg by mouth at bedtime.  09/26/19  Yes Milus Banister, MD   famotidine (PEPCID) 40 MG tablet Take 1 tablet by mouth every morning. Patient not taking: Reported on 10/07/2019 12/28/18   Alfredia Ferguson, PA-C     This patient is critically ill with multiple organ system failure which requires frequent high complexity decision making, assessment, support, evaluation, and titration of therapies. This was completed through the application of advanced monitoring technologies and extensive interpretation of multiple databases. During this encounter critical care time was devoted to patient care services described in this note for 45 minutes.  Julian Hy, DO 10/08/19 8:51 AM [facility]

## 2019-10-09 MED ORDER — CARVEDILOL 3.125 MG PO TABS
3.1250 mg | ORAL_TABLET | Freq: Once | ORAL | Status: AC
  Administered 2019-10-09: 11:00:00 3.125 mg via ORAL
  Filled 2019-10-09: qty 1

## 2019-10-09 MED ORDER — CARVEDILOL 6.25 MG PO TABS
6.2500 mg | ORAL_TABLET | Freq: Two times a day (BID) | ORAL | Status: DC
  Administered 2019-10-09 – 2019-10-12 (×6): 6.25 mg via ORAL
  Filled 2019-10-09 (×6): qty 1

## 2019-10-09 MED ORDER — SALINE SPRAY 0.65 % NA SOLN
2.0000 | NASAL | Status: DC | PRN
  Filled 2019-10-09: qty 44

## 2019-10-09 MED ORDER — ENSURE ENLIVE PO LIQD
1.0000 | Freq: Three times a day (TID) | ORAL | Status: DC
  Administered 2019-10-09 – 2019-10-12 (×7): 237 mL via ORAL

## 2019-10-09 NOTE — Progress Notes (Signed)
Pt arrived to unit from 2H 

## 2019-10-09 NOTE — Plan of Care (Signed)
  Problem: Clinical Measurements: Goal: Ability to maintain clinical measurements within normal limits will improve Outcome: Progressing Goal: Respiratory complications will improve Outcome: Progressing On room air, O2 sats mid-high 90's   Problem: Activity: Goal: Risk for activity intolerance will decrease Outcome: Progressing Ambulating in room   Problem: Nutrition: Goal: Adequate nutrition will be maintained Outcome: Progressing   Problem: Coping: Goal: Level of anxiety will decrease Outcome: Progressing   Problem: Pain Managment: Goal: General experience of comfort will improve Outcome: Progressing   Problem: Skin Integrity: Goal: Risk for impaired skin integrity will decrease Outcome: Progressing

## 2019-10-09 NOTE — Progress Notes (Signed)
[facility]  Subjective  - chest pain resolved no appetite no abdominal pain   Objective 132/71 96 98.5 F (36.9 C) (Oral) 12 96%  Intake/Output Summary (Last 24 hours) at 10/09/2019 [id number] Last data filed at 10/09/2019 0800 Gross per 24 hour  Intake 684.32 ml  Output 1400 ml  Net -715.68 ml   Abdomen soft non tender 2+ DP pulses  Assessment/Planning: Type B dissection still on IV esmolol  Poor PO intake but says she can drink ensure  Ok to ambulate and get out of bed  CT prior to d/c but would wait for now  Ruta Hinds 10/09/2019 9:07 AM --  Laboratory Lab Results: Recent Labs    10/07/19 0730 10/08/19 0213  WBC 8.9 13.7*  HGB 14.7 14.1  HCT 44.6 42.5  PLT 231 207   BMET Recent Labs    10/07/19 0730 10/08/19 0213  NA 140 139  K 3.4* 4.0  CL 104 105  CO2 23 24  GLUCOSE 176* 129*  BUN 13 17  CREATININE 0.90 0.88  CALCIUM 8.8* 8.5*    COAG No results found for: INR, PROTIME No results found for: PTT

## 2019-10-09 NOTE — Progress Notes (Addendum)
NAME:  Melanie Nash, MRN:  [id number], DOB:  [birth year], LOS: 2 ADMISSION DATE:  10/07/2019, CONSULTATION DATE:  10/07/2019 REFERRING MD:  Dr. Wilson Singer, CHIEF COMPLAINT:  Chest pain    Brief History   73yo F with history of hypertension self discontinued meds presented with type B aortic dissection.  History of present illness   Admitted with significant left sided chest pain that radiated to back with associated bilateral arm tingling that woke patient from sleep at approximately 6am. The pain is constant with no real alleviating or aggravating factors. Patient reports history of hypertension but self discontinued antihypertensives as she "felt I no longer needed them, my numbers have been fine". Also reports history of esophageal strictures requiring multiple dilations in the past, last dilation 01/2019,  She denies any other symptom including fever, chills, shortness of breath, or lower extremity swelling.  Workup on admission; mild hypokalemia, hyperglycemia, and hypertensive BP 153/88. CT angio chest with long segment of circumferential aortic wall thickening extending from the proximal transvers arch to the distal descending thoracic aorta.  PCCM called for admission given need for close monitoring and tight blood pressure control.   Additional history obtained- she was prescribed low-dose losartan PTA, but was not taking this medication for "borderline high BP".  Has not had antibiotics within the past year.  Previous episode of diverticulitis in 2018 was treated with beta lactams.  Her father had an abdominal aortic aneurysm, but no history of dissection.  Past Medical History  Hypertension GERD Hiatal hernia Fibromyalgia Esophageal strictures staus post multiple dilations Diverticulosis   Significant Hospital Events   10/9 Admitted- esmolol for HR & BP control  Consults:  Vascular surgery   Procedures:  Echocardiogram 10/08/2019- LVEF 60 to 65%, mild LVH, diastolic dysfunction.   Normal LA, RV, RA.  Normal aortic valve.  Significant Diagnostic Tests:  CT angio chest 10/9 >> Long segment of circumferential aortic wall thickening extending from the proximal transvers arch to the distal descending thoracic aorta  Micro Data:  COVID 10/9 >>   Antimicrobials:  n/a  Interim history/subjective:  She denies pain, weakness, paresis, paresthesias, vision changes, or other new symptoms.  Her appetite is been reduced since patient.  No abdominal pain.  Objective   Blood pressure 132/71, pulse 96, temperature 98.5 F (36.9 C), temperature source Oral, resp. rate 12, height 5\' 2"  (1.575 m), weight 80.5 kg, SpO2 96 %.        Intake/Output Summary (Last 24 hours) at 10/09/2019 0941 Last data filed at 10/09/2019 0900 Gross per 24 hour  Intake 684.32 ml  Output 1600 ml  Net -915.68 ml   Filed Weights   10/07/19 0721 10/08/19 0545 10/09/19 0500  Weight: 76 kg 78.7 kg 80.5 kg    Examination: General: Elderly woman lying comfortably in bed HEENT: [place]/AT, eyes anicteric Neuro: Alert, interactive, answering questions appropriately and conversational.  Moving all extremities spontaneously. CV: S1, S2, regular rate and rhythm, no murmurs.  Heart rate in mid 80s. PULM: CTAB, breathing comfortably on supplemental oxygen.  No tachypnea. GI: Soft, nontender, nondistended extremities: No cyanosis, clubbing, edema skin: No rashes or skin wounds  Resolved Hospital Problem list     Assessment & Plan:  Type B aortic dissection / Intramural Hematoma due to poorly controlled hypertension.  No secondary causes found. Uncontrolled Essential Hypertension -History of hypertension with self discontinuation of antihypertensives approximately 2 years ago. Patient and family also endorse significant amounts of increased stress lately  P: -Increasing Coreg to  6.25 mg twice daily -Would consider starting losartan later today if still requiring esmolol.  Titrate down as small as able to  maintain SBP less than 120, ideally with heart rate less than 60. -Hydralazine PRN -Continue neurovascular checks.  Per vascular surgery, okay to begin ambulation -Needs CT chest abdomen pelvis with contrast prior to discharge per vascular surgery -Appreciate vascular's input   GERD  P: -Continue prior to admission PPI  Best practice:  Diet: NPO Pain/Anxiety/Delirium protocol (if indicated): PRN morphine VAP protocol (if indicated): N/A DVT prophylaxis: SCD GI prophylaxis: PPI Glucose control: Monitor  Mobility: Ambulate with assistance Code Status: LCB Family Communication: Discussed plan with patient. Disposition: ICU  Labs   CBC: Recent Labs  Lab 10/07/19 0730 10/08/19 0213  WBC 8.9 13.7*  HGB 14.7 14.1  HCT 44.6 42.5  MCV 91.6 90.0  PLT 231 [id number]    Basic Metabolic Panel: Recent Labs  Lab 10/07/19 0730 10/08/19 0213  NA 140 139  K 3.4* 4.0  CL 104 105  CO2 23 24  GLUCOSE 176* 129*  BUN 13 17  CREATININE 0.90 0.88  CALCIUM 8.8* 8.5*  MG  --  2.2  PHOS  --  3.8   GFR: Estimated Creatinine Clearance: 56 mL/min (by C-G formula based on SCr of 0.88 mg/dL). Recent Labs  Lab 10/07/19 0730 10/08/19 0213  WBC 8.9 13.7*    Liver Function Tests: No results for input(s): AST, ALT, ALKPHOS, BILITOT, PROT, ALBUMIN in the last 168 hours. Recent Labs  Lab 10/07/19 0921  LIPASE 18   No results for input(s): AMMONIA in the last 168 hours.  ABG    Component Value Date/Time   TCO2 26 02/26/2016 1738     Coagulation Profile: No results for input(s): INR, PROTIME in the last 168 hours.  Cardiac Enzymes: No results for input(s): CKTOTAL, CKMB, CKMBINDEX, TROPONINI in the last 168 hours.  HbA1C: No results found for: HGBA1C  CBG: No results for input(s): GLUCAP in the last 168 hours.  Allergies: No Known Allergies   Home Medications  Prior to Admission medications   Medication Sig Start Date End Date Taking? Authorizing Provider  acetaminophen  (TYLENOL) 500 MG tablet Take 1 tablet (500 mg total) by mouth every 6 (six) hours as needed for mild pain (or Fever >/= 101). 01/30/17  Yes Regalado, Belkys A, MD  omeprazole (PRILOSEC) 20 MG capsule TAKE 1 CAPSULE BY MOUTH EVERY NIGHT AT BEDTIME' Patient taking differently: Take 20 mg by mouth at bedtime.  09/26/19  Yes Milus Banister, MD  famotidine (PEPCID) 40 MG tablet Take 1 tablet by mouth every morning. Patient not taking: Reported on 10/07/2019 12/28/18   Alfredia Ferguson, PA-C    This patient is critically ill with multiple organ system failure which requires frequent high complexity decision making, assessment, support, evaluation, and titration of therapies. This was completed through the application of advanced monitoring technologies and extensive interpretation of multiple databases. During this encounter critical care time was devoted to patient care services described in this note for 30 minutes.   Julian Hy, DO 10/09/19 9:41 AM [facility]    Patient re-evaluated this evening. She has been doing well off esmolol infusion for several hours on increased dose of coreg.  Will step down to progressive care unit. D/w Dr. Wyline Copas from [facility].  Julian Hy, DO 10/09/19 5:20 PM [facility]

## 2019-10-10 MED ORDER — METOPROLOL TARTRATE 5 MG/5ML IV SOLN
5.0000 mg | INTRAVENOUS | Status: AC | PRN
  Administered 2019-10-10 – 2019-10-11 (×2): 5 mg via INTRAVENOUS
  Filled 2019-10-10: qty 5

## 2019-10-10 MED ORDER — LORATADINE 10 MG PO TABS
10.0000 mg | ORAL_TABLET | Freq: Every day | ORAL | Status: DC
  Administered 2019-10-10 – 2019-10-13 (×4): 10 mg via ORAL
  Filled 2019-10-10 (×5): qty 1

## 2019-10-10 MED ORDER — SODIUM CHLORIDE 0.9 % IV BOLUS
250.0000 mL | Freq: Once | INTRAVENOUS | Status: AC
  Administered 2019-10-10: 250 mL via INTRAVENOUS

## 2019-10-10 NOTE — Progress Notes (Signed)
Patient's B/P 150/80 at this time, 20 Mg IV Hydralazine given slow IVP, shortly after patient's HR went up into the 120-138 range. Patient had just got up to go to the BR when her HR shot up, got her back to bed, got an EKG and called [facility], orders received to give 5 mg IV Metoprolol, will give and continue to monitor. Patient had no symptoms but said something woke her up from her sleep and she thought it was that she had to go to the BR, when her HR went up to 130's, she said she was having some tingling in her chest, MD aware, will continue to monitor.

## 2019-10-10 NOTE — Care Management Important Message (Signed)
Important Message  Patient Details  Name: Melanie Nash MRN: [id number] Date of Birth: [birth year]   Medicare Important Message Given:  Yes     Shelda Altes 10/10/2019, 2:38 PM

## 2019-10-10 NOTE — Progress Notes (Signed)
[facility]  Subjective  - had some sniffles today   Objective (!) 106/57 86 98.5 F (36.9 C) (Oral) 15 98%  Intake/Output Summary (Last 24 hours) at 10/10/2019 1903 Last data filed at 10/10/2019 1505 Gross per 24 hour  Intake 403.8 ml  Output 750 ml  Net -346.2 ml   2+ pedal pulses No abdominal pain  Assessment/Planning: Low BP today Getting close to discharge needs to be stable on meds  CT scan on 10/13 or 14 depending on if stable   Ruta Hinds 10/10/2019 7:03 PM --  Laboratory Lab Results: Recent Labs    10/08/19 0213  WBC 13.7*  HGB 14.1  HCT 42.5  PLT 207   BMET Recent Labs    10/08/19 0213  NA 139  K 4.0  CL 105  CO2 24  GLUCOSE 129*  BUN 17  CREATININE 0.88  CALCIUM 8.5*    COAG No results found for: INR, PROTIME No results found for: PTT

## 2019-10-10 NOTE — Progress Notes (Signed)
PROGRESS NOTE    Melanie Nash  [id number] DOB: [birth year] DOA: 10/07/2019 PCP: [name], MD    Brief Narrative:  73yo with hx HTN presented with aortic dissection. Pt was admitted to ICU service and continued on esmolol for improved BP control. Vascular Surgery was consulted and had been following. Pt's bp improved and pt remained stable for transfer to [facility]:   Active Problems:   Aortic dissection ([place])   1. Aortic dissection 1. BP now better controlled 2. Vascular surgery following with recommendation for CT prior to d/c 3. OK for ambulation per Vascular Surgery 2. HTN 1. BP better after esmolol gtt 2. Pt hypotensive this AM after receiving multiple PRN meds for tachycardia, improved with 250cc bolus 3. Cont coreg as tolerated 3. GERD 1. Continued on Protonix as tolerated 4. Tachycardia 1. Continue on PRN IV BB 2. Pt is continued on scheduled coreg 3. Cont on tele 4. Recent electrolytes reviewed, stable 5. Will repeat bmet in AM  DVT prophylaxis: SCD's Code Status: No CPR, No defibrillation Family Communication: Pt in room, family not at bedside Disposition Plan: Uncertain at this time  Consultants:   PCCM  Vascular Surgery  Procedures:     Antimicrobials: Anti-infectives (From admission, onward)   None       Subjective: Reports feeling generally weaker today  Objective: Vitals:   10/10/19 1003 10/10/19 1008 10/10/19 1206 10/10/19 1603  BP: (!) 81/48 (!) 77/45 (!) 102/57 132/76  Pulse: 86 81 85 86  Resp:   16 15  Temp:   98.1 F (36.7 C) 98.5 F (36.9 C)  TempSrc:   Oral Oral  SpO2: 94% 93%  98%  Weight:      Height:        Intake/Output Summary (Last 24 hours) at 10/10/2019 1813 Last data filed at 10/10/2019 1505 Gross per 24 hour  Intake 403.8 ml  Output 750 ml  Net -346.2 ml   Filed Weights   10/08/19 0545 10/09/19 0500 10/10/19 0230  Weight: 78.7 kg 80.5 kg 77.2 kg    Examination:  General exam:  Appears calm and comfortable  Respiratory system: Clear to auscultation. Respiratory effort normal. Cardiovascular system: S1 & S2 heard, RRR Gastrointestinal system: Abdomen is nondistended, soft and nontender. No organomegaly or masses felt. Normal bowel sounds heard. Central nervous system: Alert and oriented. No focal neurological deficits. Extremities: Symmetric 5 x 5 power. Skin: No rashes, lesions Psychiatry: Judgement and insight appear normal. Mood & affect appropriate.   Data Reviewed: I have personally reviewed following labs and imaging studies  CBC: Recent Labs  Lab 10/07/19 0730 10/08/19 0213  WBC 8.9 13.7*  HGB 14.7 14.1  HCT 44.6 42.5  MCV 91.6 90.0  PLT 231 [id number]   Basic Metabolic Panel: Recent Labs  Lab 10/07/19 0730 10/08/19 0213  NA 140 139  K 3.4* 4.0  CL 104 105  CO2 23 24  GLUCOSE 176* 129*  BUN 13 17  CREATININE 0.90 0.88  CALCIUM 8.8* 8.5*  MG  --  2.2  PHOS  --  3.8   GFR: Estimated Creatinine Clearance: 54.7 mL/min (by C-G formula based on SCr of 0.88 mg/dL). Liver Function Tests: No results for input(s): AST, ALT, ALKPHOS, BILITOT, PROT, ALBUMIN in the last 168 hours. Recent Labs  Lab 10/07/19 0921  LIPASE 18   No results for input(s): AMMONIA in the last 168 hours. Coagulation Profile: No results for input(s): INR, PROTIME in the last 168 hours.  Cardiac Enzymes: No results for input(s): CKTOTAL, CKMB, CKMBINDEX, TROPONINI in the last 168 hours. BNP (last 3 results) No results for input(s): PROBNP in the last 8760 hours. HbA1C: No results for input(s): HGBA1C in the last 72 hours. CBG: No results for input(s): GLUCAP in the last 168 hours. Lipid Profile: No results for input(s): CHOL, HDL, LDLCALC, TRIG, CHOLHDL, LDLDIRECT in the last 72 hours. Thyroid Function Tests: No results for input(s): TSH, T4TOTAL, FREET4, T3FREE, THYROIDAB in the last 72 hours. Anemia Panel: No results for input(s): VITAMINB12, FOLATE, FERRITIN, TIBC,  IRON, RETICCTPCT in the last 72 hours. Sepsis Labs: No results for input(s): PROCALCITON, LATICACIDVEN in the last 168 hours.  Recent Results (from the past 240 hour(s))  SARS Coronavirus 2 by RT PCR (hospital order, performed in [facility] hospital lab) Nasopharyngeal Nasopharyngeal Swab     Status: None   Collection Time: 10/07/19  3:30 PM   Specimen: Nasopharyngeal Swab  Result Value Ref Range Status   SARS Coronavirus 2 NEGATIVE NEGATIVE Final    Comment: (NOTE) If result is NEGATIVE SARS-CoV-2 target nucleic acids are NOT DETECTED. The SARS-CoV-2 RNA is generally detectable in upper and lower  respiratory specimens during the acute phase of infection. The lowest  concentration of SARS-CoV-2 viral copies this assay can detect is 250  copies / mL. A negative result does not preclude SARS-CoV-2 infection  and should not be used as the sole basis for treatment or other  patient management decisions.  A negative result may occur with  improper specimen collection / handling, submission of specimen other  than nasopharyngeal swab, presence of viral mutation(s) within the  areas targeted by this assay, and inadequate number of viral copies  (<250 copies / mL). A negative result must be combined with clinical  observations, patient history, and epidemiological information. If result is POSITIVE SARS-CoV-2 target nucleic acids are DETECTED. The SARS-CoV-2 RNA is generally detectable in upper and lower  respiratory specimens dur ing the acute phase of infection.  Positive  results are indicative of active infection with SARS-CoV-2.  Clinical  correlation with patient history and other diagnostic information is  necessary to determine patient infection status.  Positive results do  not rule out bacterial infection or co-infection with other viruses. If result is PRESUMPTIVE POSTIVE SARS-CoV-2 nucleic acids MAY BE PRESENT.   A presumptive positive result was obtained on the submitted  specimen  and confirmed on repeat testing.  While 2019 novel coronavirus  (SARS-CoV-2) nucleic acids may be present in the submitted sample  additional confirmatory testing may be necessary for epidemiological  and / or clinical management purposes  to differentiate between  SARS-CoV-2 and other Sarbecovirus currently known to infect humans.  If clinically indicated additional testing with an alternate test  methodology [phone]) is advised. The SARS-CoV-2 RNA is generally  detectable in upper and lower respiratory sp ecimens during the acute  phase of infection. The expected result is Negative. Fact Sheet for Patients:  StrictlyIdeas.no Fact Sheet for Healthcare Providers: BankingDealers.co.za This test is not yet approved or cleared by the [place] FDA and has been authorized for detection and/or diagnosis of SARS-CoV-2 by FDA under an Emergency Use Authorization (EUA).  This EUA will remain in effect (meaning this test can be used) for the duration of the COVID-19 declaration under Section 564(b)(1) of the Act, 21 U.S.C. section 360bbb-3(b)(1), unless the authorization is terminated or revoked sooner. Performed at [facility], [place] [address], [place], [place] [address]  MRSA PCR Screening     Status: None   Collection Time: 10/07/19  5:45 PM   Specimen: Nasal Mucosa; Nasopharyngeal  Result Value Ref Range Status   MRSA by PCR NEGATIVE NEGATIVE Final    Comment:        The GeneXpert MRSA Assay (FDA approved for NASAL specimens only), is one component of a comprehensive MRSA colonization surveillance program. It is not intended to diagnose MRSA infection nor to guide or monitor treatment for MRSA infections. Performed at [facility], [place] [address], [place], [place] [address]      Radiology Studies: No results found.  Scheduled Meds: . carvedilol  6.25 mg Oral BID WC  . Chlorhexidine Gluconate Cloth  6  each Topical Daily  . feeding supplement (ENSURE ENLIVE)  1 Bottle Oral TID BM  . loratadine  10 mg Oral Daily  . pantoprazole  40 mg Oral Daily   Continuous Infusions:   LOS: 3 days   Marylu Lund, MD [facility] Pager On Amion  If 7PM-7AM, please contact night-coverage 10/10/2019, 6:13 PM

## 2019-10-10 NOTE — Evaluation (Signed)
Physical Therapy Evaluation Patient Details Name: Melanie Nash MRN: [id number] DOB: November 17, 1946 Today's Date: 10/10/2019   History of Present Illness  Pt presented with lt chest and back pain and found to have type B aortic dissection/intramural hematoma. Vascular consulted and pt to be treated medically. PMH - HTN, arthritis, fibromyalgia  Clinical Impression  Pt presents to PT with slightly guarded, hesitant gait in hallway. Will follow acutely but expect return to baseline and that pt will not need PT at time of DC.     Follow Up Recommendations No PT follow up    Equipment Recommendations  None recommended by PT    Recommendations for Other Services       Precautions / Restrictions Restrictions Weight Bearing Restrictions: No      Mobility  Bed Mobility Overal bed mobility: Modified Independent                Transfers Overall transfer level: Modified independent Equipment used: None                Ambulation/Gait Ambulation/Gait assistance: Supervision;Modified independent (Device/Increase time) Gait Distance (Feet): 325 Feet Assistive device: None Gait Pattern/deviations: Step-through pattern;Decreased stride length Gait velocity: decr Gait velocity interpretation: 1.31 - 2.62 ft/sec, indicative of limited community ambulator General Gait Details: In room pt modified independent but in hallway supervision to min guard in more open environment  Stairs            Wheelchair Mobility    Modified Rankin (Stroke Patients Only)       Balance Overall balance assessment: Mild deficits observed, not formally tested                                           Pertinent Vitals/Pain Pain Assessment: No/denies pain    Home Living Family/patient expects to be discharged to:: Private residence Living Arrangements: Spouse/significant other(has dementia) Available Help at Discharge: Family(spouse has dementia) Type of Home:  House Home Access: Stairs to enter   [employer] of Steps: 2-3 Home Layout: One level Home Equipment: None      Prior Function Level of Independence: Independent               Hand Dominance        Extremity/Trunk Assessment   Upper Extremity Assessment Upper Extremity Assessment: Overall WFL for tasks assessed    Lower Extremity Assessment Lower Extremity Assessment: Generalized weakness       Communication   Communication: No difficulties  Cognition Arousal/Alertness: Awake/alert Behavior During Therapy: WFL for tasks assessed/performed Overall Cognitive Status: Within Functional Limits for tasks assessed                                        General Comments      Exercises     Assessment/Plan    PT Assessment Patient needs continued PT services  PT Problem List Decreased strength;Decreased balance;Decreased mobility       PT Treatment Interventions Gait training;Functional mobility training;Therapeutic exercise;Balance training;Therapeutic activities;Patient/family education    PT Goals (Current goals can be found in the Care Plan section)  Acute Rehab PT Goals Patient Stated Goal: return home PT Goal Formulation: With patient Time For Goal Achievement: 10/17/19 Potential to Achieve Goals: Good    Frequency Min 3X/week  Barriers to discharge        Co-evaluation               AM-PAC PT "6 Clicks" Mobility  Outcome Measure Help needed turning from your back to your side while in a flat bed without using bedrails?: None Help needed moving from lying on your back to sitting on the side of a flat bed without using bedrails?: None Help needed moving to and from a bed to a chair (including a wheelchair)?: None Help needed standing up from a chair using your arms (e.g., wheelchair or bedside chair)?: None Help needed to walk in hospital room?: None Help needed climbing 3-5 steps with a railing? : A Little 6  Click Score: 23    End of Session   Activity Tolerance: Patient tolerated treatment well Patient left: in bed;with call bell/phone within reach Nurse Communication: Mobility status PT Visit Diagnosis: Unsteadiness on feet (R26.81);Muscle weakness (generalized) (M62.81)    Time: 1139-1150 PT Time Calculation (min) (ACUTE ONLY): 11 min   Charges:   PT Evaluation $PT Eval Low Complexity: [place] Pager [phone] Office [place] 10/10/2019, 2:26 PM

## 2019-10-11 ENCOUNTER — Inpatient Hospital Stay (HOSPITAL_COMMUNITY): Payer: PPO

## 2019-10-11 LAB — COMPREHENSIVE METABOLIC PANEL
ALT: 52 U/L — ABNORMAL HIGH (ref 0–44)
AST: 53 U/L — ABNORMAL HIGH (ref 15–41)
Albumin: 2.5 g/dL — ABNORMAL LOW (ref 3.5–5.0)
Alkaline Phosphatase: 92 U/L (ref 38–126)
Anion gap: 11 (ref 5–15)
BUN: 18 mg/dL (ref 8–23)
CO2: 22 mmol/L (ref 22–32)
Calcium: 8.2 mg/dL — ABNORMAL LOW (ref 8.9–10.3)
Chloride: 106 mmol/L (ref 98–111)
Creatinine, Ser: 0.82 mg/dL (ref 0.44–1.00)
GFR calc Af Amer: >60 mL/min (ref >60)
GFR calc non Af Amer: >60 mL/min (ref >60)
Glucose, Bld: 94 mg/dL (ref 70–99)
Potassium: 3.6 mmol/L (ref 3.5–5.1)
Sodium: 139 mmol/L (ref 135–145)
Total Bilirubin: 1 mg/dL (ref 0.3–1.2)
Total Protein: 5.3 g/dL — ABNORMAL LOW (ref 6.5–8.1)

## 2019-10-11 LAB — MAGNESIUM: Magnesium: 2.2 mg/dL (ref 1.7–2.4)

## 2019-10-11 LAB — CBC
HCT: 37.2 % (ref 36.0–46.0)
Hemoglobin: 13.1 g/dL (ref 12.0–15.0)
MCH: 31.1 pg (ref 26.0–34.0)
MCHC: 35.2 g/dL (ref 30.0–36.0)
MCV: 88.4 fL (ref 80.0–100.0)
Platelets: 201 10*3/uL (ref 150–400)
RBC: 4.21 MIL/uL (ref 3.87–5.11)
RDW: 14.5 % (ref 11.5–15.5)
WBC: 8.8 10*3/uL (ref 4.0–10.5)
nRBC: 0 % (ref 0.0–0.2)

## 2019-10-11 MED ORDER — IOHEXOL 350 MG/ML SOLN
100.0000 mL | Freq: Once | INTRAVENOUS | Status: AC | PRN
  Administered 2019-10-11: 100 mL via INTRAVENOUS

## 2019-10-11 MED ORDER — HYDROXYZINE HCL 10 MG PO TABS
10.0000 mg | ORAL_TABLET | Freq: Three times a day (TID) | ORAL | Status: DC | PRN
  Administered 2019-10-11 – 2019-10-13 (×3): 10 mg via ORAL
  Filled 2019-10-11 (×3): qty 1

## 2019-10-11 NOTE — Progress Notes (Signed)
PROGRESS NOTE    Melanie Nash  [id number] DOB: 06-30-1946 DOA: 10/07/2019 PCP: [name], MD    Brief Narrative:  73yo with hx HTN presented with aortic dissection. Pt was admitted to ICU service and continued on esmolol for improved BP control. Vascular Surgery was consulted and had been following. Pt's bp improved and pt remained stable for transfer to [facility]:   Active Problems:   Aortic dissection ([place])   1. Aortic dissection 1. BP now better controlled 2. Vascular surgery following, ordered follow CT angio today, reviewed. 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 3. Recommendation for semi-annual imaging follow up by CTA or MRA 4. OK for ambulation per Vascular Surgery 2. HTN 1. BP better after esmolol gtt 2. Recently hypotensive with sbp to the 70's after receiving multiple PRN meds for tachycardia on 10/12, resolved 3. Cont coreg as tolerated 3. GERD 1. Continued on Protonix as tolerated 4. Tachycardia 1. Continue on PRN IV BB 2. Pt is continued on scheduled coreg 3. Cont on tele 4. Recent electrolytes reviewed, stable 5. Recheck CBC and bmet in AM  DVT prophylaxis: SCD's Code Status: No CPR, No defibrillation Family Communication: Pt in room, family not at bedside Disposition Plan: Possible d/c home in 24-48hrs when cleared by Vascular Surgery  Consultants:   PCCM  Vascular Surgery  Procedures:   CTA Chest, abd/pelvis 10/13  Antimicrobials: Anti-infectives (From admission, onward)   None      Subjective: Reports feeling generally weaker today  Objective: Vitals:   10/11/19 0655 10/11/19 0750 10/11/19 1334 10/11/19 1417  BP:  131/60 137/75 (!) 130/52  Pulse:  89 80 80  Resp:      Temp:  97.9 F (36.6 C) 98.3 F (36.8 C)   TempSrc:  Oral Oral   SpO2:   96% 97%   Weight: 74.8 kg     Height:        Intake/Output Summary (Last 24 hours) at 10/11/2019 1640 Last data filed at 10/11/2019 1245 Gross per 24 hour  Intake 840 ml  Output --  Net 840 ml   Filed Weights   10/09/19 0500 10/10/19 0230 10/11/19 0655  Weight: 80.5 kg 77.2 kg 74.8 kg    Examination: General exam: Awake, laying in bed, in nad Respiratory system: Normal respiratory effort, no wheezing Cardiovascular system: regular rate, s1, s2 Gastrointestinal system: Soft, nondistended, positive BS Central nervous system: CN2-12 grossly intact, strength intact Extremities: Perfused, no clubbing Skin: Normal skin turgor, no notable skin lesions seen Psychiatry: Mood normal // no visual hallucinations   Data Reviewed: I have personally reviewed following labs and imaging studies  CBC: Recent Labs  Lab 10/07/19 0730 10/08/19 0213 10/11/19 0357  WBC 8.9 13.7* 8.8  HGB 14.7 14.1 13.1  HCT 44.6 42.5 37.2  MCV 91.6 90.0 88.4  PLT 231 [id number]   Basic Metabolic Panel: Recent Labs  Lab 10/07/19 0730 10/08/19 0213 10/11/19 0357  NA 140 139 139  K 3.4* 4.0 3.6  CL 104 105 106  CO2 23 24 22   GLUCOSE 176* 129* 94  BUN 13 17 18   CREATININE 0.90 0.88 0.82  CALCIUM 8.8* 8.5* 8.2*  MG  --  2.2 2.2  PHOS  --  3.8  --    GFR: Estimated Creatinine Clearance: 57.9 mL/min (by C-G formula based on  SCr of 0.82 mg/dL). Liver Function Tests: Recent Labs  Lab 10/11/19 0357  AST 53*  ALT 52*  ALKPHOS 92  BILITOT 1.0  PROT 5.3*  ALBUMIN 2.5*   Recent Labs  Lab 10/07/19 0921  LIPASE 18   No results for input(s): AMMONIA in the last 168 hours. Coagulation Profile: No results for input(s): INR, PROTIME in the last 168 hours. Cardiac Enzymes: No results for input(s): CKTOTAL, CKMB, CKMBINDEX, TROPONINI in the last 168 hours. BNP (last 3 results) No results for input(s): PROBNP in the last 8760 hours. HbA1C: No results for input(s): HGBA1C in the last 72  hours. CBG: No results for input(s): GLUCAP in the last 168 hours. Lipid Profile: No results for input(s): CHOL, HDL, LDLCALC, TRIG, CHOLHDL, LDLDIRECT in the last 72 hours. Thyroid Function Tests: No results for input(s): TSH, T4TOTAL, FREET4, T3FREE, THYROIDAB in the last 72 hours. Anemia Panel: No results for input(s): VITAMINB12, FOLATE, FERRITIN, TIBC, IRON, RETICCTPCT in the last 72 hours. Sepsis Labs: No results for input(s): PROCALCITON, LATICACIDVEN in the last 168 hours.  Recent Results (from the past 240 hour(s))  SARS Coronavirus 2 by RT PCR (hospital order, performed in [facility] hospital lab) Nasopharyngeal Nasopharyngeal Swab     Status: None   Collection Time: 10/07/19  3:30 PM   Specimen: Nasopharyngeal Swab  Result Value Ref Range Status   SARS Coronavirus 2 NEGATIVE NEGATIVE Final    Comment: (NOTE) If result is NEGATIVE SARS-CoV-2 target nucleic acids are NOT DETECTED. The SARS-CoV-2 RNA is generally detectable in upper and lower  respiratory specimens during the acute phase of infection. The lowest  concentration of SARS-CoV-2 viral copies this assay can detect is 250  copies / mL. A negative result does not preclude SARS-CoV-2 infection  and should not be used as the sole basis for treatment or other  patient management decisions.  A negative result may occur with  improper specimen collection / handling, submission of specimen other  than nasopharyngeal swab, presence of viral mutation(s) within the  areas targeted by this assay, and inadequate number of viral copies  (<250 copies / mL). A negative result must be combined with clinical  observations, patient history, and epidemiological information. If result is POSITIVE SARS-CoV-2 target nucleic acids are DETECTED. The SARS-CoV-2 RNA is generally detectable in upper and lower  respiratory specimens dur ing the acute phase of infection.  Positive  results are indicative of active infection with  SARS-CoV-2.  Clinical  correlation with patient history and other diagnostic information is  necessary to determine patient infection status.  Positive results do  not rule out bacterial infection or co-infection with other viruses. If result is PRESUMPTIVE POSTIVE SARS-CoV-2 nucleic acids MAY BE PRESENT.   A presumptive positive result was obtained on the submitted specimen  and confirmed on repeat testing.  While 2019 novel coronavirus  (SARS-CoV-2) nucleic acids may be present in the submitted sample  additional confirmatory testing may be necessary for epidemiological  and / or clinical management purposes  to differentiate between  SARS-CoV-2 and other Sarbecovirus currently known to infect humans.  If clinically indicated additional testing with an alternate test  methodology [phone]) is advised. The SARS-CoV-2 RNA is generally  detectable in upper and lower respiratory sp ecimens during the acute  phase of infection. The expected result is Negative. Fact Sheet for Patients:  StrictlyIdeas.no Fact Sheet for Healthcare Providers: BankingDealers.co.za This test is not yet approved or cleared by the [place] FDA and has been  authorized for detection and/or diagnosis of SARS-CoV-2 by FDA under an Emergency Use Authorization (EUA).  This EUA will remain in effect (meaning this test can be used) for the duration of the COVID-19 declaration under Section 564(b)(1) of the Act, 21 U.S.C. section 360bbb-3(b)(1), unless the authorization is terminated or revoked sooner. Performed at [facility], [place] [address] [place], [place] [address]   MRSA PCR Screening     Status: None   Collection Time: 10/07/19  5:45 PM   Specimen: Nasal Mucosa; Nasopharyngeal  Result Value Ref Range Status   MRSA by PCR NEGATIVE NEGATIVE Final    Comment:        The GeneXpert MRSA Assay (FDA approved for NASAL specimens only), is one component of  a comprehensive MRSA colonization surveillance program. It is not intended to diagnose MRSA infection nor to guide or monitor treatment for MRSA infections. Performed at [facility], [place] [address] [place], [place] [address]      Radiology Studies: Ct Angio Chest/abd/pel For Dissection W And/or W/wo  Result Date: 10/11/2019 CLINICAL DATA:  Aortic dissection, follow-up EXAM: CT ANGIOGRAPHY CHEST, ABDOMEN AND PELVIS TECHNIQUE: Multidetector CT imaging through the chest, abdomen and pelvis was performed using the standard protocol during bolus administration of intravenous contrast. Multiplanar reconstructed images and MIPs were obtained and reviewed to evaluate the vascular anatomy. CONTRAST:  119mL OMNIPAQUE IOHEXOL 350 MG/ML SOLN COMPARISON:  10/07/2019 FINDINGS: CTA CHEST FINDINGS Cardiovascular: Heart size normal. No pericardial effusion. Satisfactory opacification of pulmonary arteries noted, and there is no evidence of pulmonary emboli. There is good contrast opacification of the thoracic aorta. Ascending thoracic segment and proximal arch unremarkable. Classic 3 vessel brachiocephalic arterial origin anatomy without proximal stenosis. Just beyond the left subclavian artery origin there is eccentric circumferential thickening of the aortic wall which is hyperdense on the noncontrast scan consistent with intramural hematoma or thrombosed false lumen of dissection. There is no compromise of the true lumen. There is 4 cm dilatation of the distal arch extending through the mid descending segment, which tapers to 3.1 cm above the diaphragm. No leak. Mediastinum/Nodes: No hilar or mediastinal adenopathy. Lungs/Pleura: Trace bilateral pleural effusions. Dependent atelectasis posteriorly in both lower lobes. No pneumothorax. Musculoskeletal: Left shoulder DJD. No fracture or worrisome bone lesion. Review of the MIP images confirms the above findings. CTA ABDOMEN AND PELVIS FINDINGS VASCULAR Aorta:  Persistent eccentric wall thickening extends through the infrarenal aorta terminating at the level of the IMA origin. No significant compromise of the true lumen. No aneurysm. No leak. Scattered calcified atheromatous plaque. Celiac: Tapered short-segment narrowing in the proximal celiac axis, of possible hemodynamic significance, patent distally. SMA: Calcified ostial plaque without stenosis. Replaced right hepatic arterial supply, an anatomic variant. Patent distally. Renals: Duplicated right, superior dominant, both patent. Duplicated left, inferior dominant, both patent. IMA: Patent without evidence of aneurysm, dissection, vasculitis or significant stenosis. Inflow: Patent without evidence of aneurysm, dissection, vasculitis or significant stenosis. Veins: No obvious venous abnormality within the limitations of this arterial phase study. Review of the MIP images confirms the above findings. NON-VASCULAR Hepatobiliary: No focal liver abnormality is seen. No gallstones, gallbladder wall thickening, or biliary dilatation. Pancreas: Unremarkable. No pancreatic ductal dilatation or surrounding inflammatory changes. Spleen: Normal in size without focal abnormality. Adrenals/Urinary Tract: Normal adrenals. Symmetric bilateral renal parenchymal enhancement. No hydronephrosis. Urinary bladder physiologically distended. Stomach/Bowel: Small hiatal hernia. Stomach and bowel are nondistended. Normal appendix. The colon is nondilated. A few sigmoid diverticula without inflammatory/edematous changes  or abscess. Lymphatic: No abdominal or pelvic adenopathy. Reproductive: Uterus and bilateral adnexa are unremarkable. Other: No ascites. No free air. Musculoskeletal: Lumbar spondylitic changes most marked L4-5. Sacral Tarlov cysts. No fracture or worrisome bone lesion. Review of the MIP images confirms the above findings. IMPRESSION: 1. 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 No evidence of branch vessel involvement nor compromise of the true lumen. 2. 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 Recommend semi-annual imaging followup by CTA or MRA and referral to cardiothoracic surgery if not already obtained. This recommendation follows 2010 ACCF/AHA/AATS/ACR/ASA/SCA/SCAI/SIR/STS/SVM Guidelines for the Diagnosis and Management of Patients With Thoracic Aortic Disease. Circulation. 2010; 121: e266-e36 3. Trace bilateral pleural effusions. 4. Small hiatal hernia. 5. Sigmoid diverticulosis. Electronically Signed   By: Lucrezia Europe M.D.   On: 10/11/2019 15:57    Scheduled Meds:  carvedilol  6.25 mg Oral BID WC   Chlorhexidine Gluconate Cloth  6 each Topical Daily   feeding supplement (ENSURE ENLIVE)  1 Bottle Oral TID BM   loratadine  10 mg Oral Daily   pantoprazole  40 mg Oral Daily   Continuous Infusions:   LOS: 4 days   Marylu Lund, MD [facility] Pager On Amion  If 7PM-7AM, please contact night-coverage 10/11/2019, 4:40 PM

## 2019-10-11 NOTE — Progress Notes (Signed)
  Progress Note    10/11/2019 9:05 AM * No surgery found *  Subjective: Feeling okay no current chest pain did have one episode of elevated blood pressure this morning  Vitals:   10/11/19 0614 10/11/19 0750  BP: 125/63 131/60  Pulse: 99 89  Resp:    Temp: 98.8 F (37.1 C) 97.9 F (36.6 C)  SpO2: 95% 96%    Physical Exam: Awake alert oriented Nonlabored respirations Abdomen is soft nontender Palpable pedal pulses bilaterally and feet are well-perfused  CBC    Component Value Date/Time   WBC 8.8 10/11/2019 0357   RBC 4.21 10/11/2019 0357   HGB 13.1 10/11/2019 0357   HCT 37.2 10/11/2019 0357   PLT 201 10/11/2019 0357   MCV 88.4 10/11/2019 0357   MCH 31.1 10/11/2019 0357   MCHC 35.2 10/11/2019 0357   RDW 14.5 10/11/2019 0357   LYMPHSABS 2.5 02/26/2016 1725   MONOABS 0.6 02/26/2016 1725   EOSABS 0.2 02/26/2016 1725   BASOSABS 0.0 02/26/2016 1725    BMET    Component Value Date/Time   NA 139 10/11/2019 0357   K 3.6 10/11/2019 0357   CL 106 10/11/2019 0357   CO2 22 10/11/2019 0357   GLUCOSE 94 10/11/2019 0357   BUN 18 10/11/2019 0357   CREATININE 0.82 10/11/2019 0357   CALCIUM 8.2 (L) 10/11/2019 0357   GFRNONAA >60 10/11/2019 0357   GFRAA >60 10/11/2019 0357    INR No results found for: INR   Intake/Output Summary (Last 24 hours) at 10/11/2019 0905 Last data filed at 10/11/2019 0751 Gross per 24 hour  Intake 643.8 ml  Output -  Net 643.8 ml     Assessment/plan:  73 y.o. female is here with thoracic IMH.  Blood pressure is controlled although 1 elevated episode this morning.  Will repeat CT angio today.  Dr. Oneida Alar to follow-up tomorrow.   Melanie Nash C. Donzetta Matters, MD [facility] Office: [phone] Pager: [phone]  10/11/2019 9:05 AM

## 2019-10-12 DIAGNOSIS — K219 Gastro-esophageal reflux disease without esophagitis: Secondary | ICD-10-CM

## 2019-10-12 LAB — CBC
HCT: 36.8 % (ref 36.0–46.0)
Hemoglobin: 12.7 g/dL (ref 12.0–15.0)
MCH: 30.6 pg (ref 26.0–34.0)
MCHC: 34.5 g/dL (ref 30.0–36.0)
MCV: 88.7 fL (ref 80.0–100.0)
Platelets: 206 10*3/uL (ref 150–400)
RBC: 4.15 MIL/uL (ref 3.87–5.11)
RDW: 14.2 % (ref 11.5–15.5)
WBC: 7.9 10*3/uL (ref 4.0–10.5)
nRBC: 0 % (ref 0.0–0.2)

## 2019-10-12 LAB — BASIC METABOLIC PANEL
Anion gap: 9 (ref 5–15)
BUN: 13 mg/dL (ref 8–23)
CO2: 23 mmol/L (ref 22–32)
Calcium: 8.3 mg/dL — ABNORMAL LOW (ref 8.9–10.3)
Chloride: 108 mmol/L (ref 98–111)
Creatinine, Ser: 0.87 mg/dL (ref 0.44–1.00)
GFR calc Af Amer: >60 mL/min (ref >60)
GFR calc non Af Amer: >60 mL/min (ref >60)
Glucose, Bld: 107 mg/dL — ABNORMAL HIGH (ref 70–99)
Potassium: 3.6 mmol/L (ref 3.5–5.1)
Sodium: 140 mmol/L (ref 135–145)

## 2019-10-12 MED ORDER — CARVEDILOL 6.25 MG PO TABS
9.3750 mg | ORAL_TABLET | Freq: Two times a day (BID) | ORAL | Status: DC
  Administered 2019-10-12 – 2019-10-13 (×3): 9.375 mg via ORAL
  Filled 2019-10-12 (×3): qty 1

## 2019-10-12 MED ORDER — ALUM & MAG HYDROXIDE-SIMETH 200-200-20 MG/5ML PO SUSP
15.0000 mL | Freq: Four times a day (QID) | ORAL | Status: DC | PRN
  Administered 2019-10-12: 21:00:00 15 mL via ORAL
  Filled 2019-10-12: qty 30

## 2019-10-12 MED ORDER — LABETALOL HCL 5 MG/ML IV SOLN
10.0000 mg | INTRAVENOUS | Status: DC | PRN
  Administered 2019-10-12 – 2019-10-13 (×4): 10 mg via INTRAVENOUS
  Filled 2019-10-12 (×5): qty 4

## 2019-10-12 NOTE — Clinical Social Work Note (Addendum)
Pt asked to speak to CSW. Pt is requesting resources to assist in her caring for her demented spouse at home. CSW has printed some resources but also emailed Elsie Stain with [employer] to see if this is something she would help with, if so CSW will see if pt is agreeable for CSW to provide Sheena  pt's information.  [place], [place]

## 2019-10-12 NOTE — Progress Notes (Signed)
PROGRESS NOTE  ALASKA BUTE [id number] DOB: [birth year]   PCP: [name], MD  Patient is from: Home  DOA: 10/07/2019 LOS: 5  Brief Narrative / Interim history: 73yo with hx HTN, Hetal hernia, GERD, fibromyalgia, esophageal stricture s/p multiple dilations and diverticulosis.  She presented with significant left-sided chest pain radiating to her back with associated bilateral arm tingling and admitted with aortic dissection.  Felt she no longer needed antihypertensive medications and stopped.   Workup on admission; mild hypokalemia, hyperglycemia, and hypertensive BP 153/88. CT angio chest with long segment of circumferential aortic wall thickening extending from the proximal transvers arch to infrarenal abdominal aorta.   Pt was admitted to ICU service and continued on esmolol for improved BP control. Vascular Surgery was consulted and had been following. Pt's bp improved and pt remained stable for transfer to [facility] on 10/13.   Repeat CT on 10/13 with stable dissection, and 4 cm aneurysmal dilation of the distal arc of proximal descending thoracic segment. Subjective: No major events overnight of this morning.  Feels anxious about going home with slightly elevated blood pressure and heart rate.  Denies chest pain, dyspnea, GI or GU symptoms.  Objective: Vitals:   10/12/19 1419 10/12/19 1420 10/12/19 1430 10/12/19 1500  BP: 130/89 130/89 140/77 139/72  Pulse:  80    Resp:      Temp:      TempSrc:      SpO2:      Weight:      Height:        Intake/Output Summary (Last 24 hours) at 10/12/2019 1704 Last data filed at 10/12/2019 0906 Gross per 24 hour  Intake 600 ml  Output -  Net 600 ml   Filed Weights   10/10/19 0230 10/11/19 0655 10/12/19 0350  Weight: 77.2 kg 74.8 kg 77.3 kg    Examination:  GENERAL: No acute distress.  Appears well.  HEENT: MMM.  Vision and hearing grossly intact.  NECK: Supple.  No apparent JVD.  RESP:  No IWOB. Good air movement  bilaterally. CVS:  RRR. Heart sounds normal.  Symmetric radial and DP pulses. ABD/GI/GU: Bowel sounds present. Soft. Non tender.  MSK/EXT:  Moves extremities. No apparent deformity or edema.  SKIN: no apparent skin lesion or wound NEURO: Awake, alert and oriented appropriately.  No gross deficit.  PSYCH: Calm. Normal affect.   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 -BP and HR improved but not at goal. -Increase Coreg from 6.25 to 9.375 mg twice daily. -Change as needed hydralazine to as needed labetalol for better HR control. -Cleared by vascular surgery for discharge. -Evaluated by PT-no need identified. -CTS to arrange outpatient follow-up and repeat CT in 3 months.  GERD -Continue Protonix  Fibromyalgia -As needed pain medications   DVT prophylaxis: SCD Code Status: Partial (no CPR or defibrillation but intubation and cardiac meds) Family Communication: Patient and/or RN. Available if any question. Disposition Plan: Likely home on 10/15 Consultants: Cardiothoracic surgery  Procedures:  None  Microbiology summarized: COVID-19 screen negative.  Sch Meds:  Scheduled Meds: . carvedilol  9.375 mg Oral BID WC  . feeding supplement (ENSURE ENLIVE)  1 Bottle Oral TID BM  . loratadine  10 mg Oral Daily  . pantoprazole  40 mg Oral Daily   Continuous Infusions: PRN Meds:.acetaminophen, hydrOXYzine, labetalol,  morphine injection, ondansetron (ZOFRAN) IV, sodium chloride  Antimicrobials: Anti-infectives (From admission, onward)   None       I have personally reviewed the following labs and images: CBC: Recent Labs  Lab 10/07/19 0730 10/08/19 0213 10/11/19 0357 10/12/19 0356  WBC 8.9 13.7* 8.8 7.9  HGB 14.7 14.1 13.1 12.7  HCT 44.6 42.5 37.2  36.8  MCV 91.6 90.0 88.4 88.7  PLT 231 207 201 206   BMP &GFR Recent Labs  Lab 10/07/19 0730 10/08/19 0213 10/11/19 0357 10/12/19 0356  NA 140 139 139 140  K 3.4* 4.0 3.6 3.6  CL 104 105 106 108  CO2 23 24 22 23   GLUCOSE 176* 129* 94 107*  BUN 13 17 18 13   CREATININE 0.90 0.88 0.82 0.87  CALCIUM 8.8* 8.5* 8.2* 8.3*  MG  --  2.2 2.2  --   PHOS  --  3.8  --   --    Estimated Creatinine Clearance: 55.5 mL/min (by C-G formula based on SCr of 0.87 mg/dL). Liver & Pancreas: Recent Labs  Lab 10/11/19 0357  AST 53*  ALT 52*  ALKPHOS 92  BILITOT 1.0  PROT 5.3*  ALBUMIN 2.5*   Recent Labs  Lab 10/07/19 0921  LIPASE 18   No results for input(s): AMMONIA in the last 168 hours. Diabetic: No results for input(s): HGBA1C in the last 72 hours. No results for input(s): GLUCAP in the last 168 hours. Cardiac Enzymes: No results for input(s): CKTOTAL, CKMB, CKMBINDEX, TROPONINI in the last 168 hours. No results for input(s): PROBNP in the last 8760 hours. Coagulation Profile: No results for input(s): INR, PROTIME in the last 168 hours. Thyroid Function Tests: No results for input(s): TSH, T4TOTAL, FREET4, T3FREE, THYROIDAB in the last 72 hours. Lipid Profile: No results for input(s): CHOL, HDL, LDLCALC, TRIG, CHOLHDL, LDLDIRECT in the last 72 hours. Anemia Panel: No results for input(s): VITAMINB12, FOLATE, FERRITIN, TIBC, IRON, RETICCTPCT in the last 72 hours. Urine analysis:    Component Value Date/Time   COLORURINE YELLOW 01/25/2017 0017   APPEARANCEUR CLEAR 01/25/2017 0017   LABSPEC 1.041 (H) 01/25/2017 0017   PHURINE 6.0 01/25/2017 0017   GLUCOSEU NEGATIVE 01/25/2017 0017   HGBUR MODERATE (A) 01/25/2017 0017   BILIRUBINUR NEGATIVE 01/25/2017 0017   KETONESUR 5 (A) 01/25/2017 0017   PROTEINUR NEGATIVE 01/25/2017 0017   UROBILINOGEN 0.2 10/06/2009 0001   NITRITE NEGATIVE 01/25/2017 0017   LEUKOCYTESUR TRACE (A) 01/25/2017 0017   Sepsis Labs: Invalid input(s):  PROCALCITONIN, [place]  Microbiology: Recent Results (from the past 240 hour(s))  SARS Coronavirus 2 by RT PCR (hospital order, performed in [facility] hospital lab) Nasopharyngeal Nasopharyngeal Swab     Status: None   Collection Time: 10/07/19  3:30 PM   Specimen: Nasopharyngeal Swab  Result Value Ref Range Status   SARS Coronavirus 2 NEGATIVE NEGATIVE Final    Comment: (NOTE) If result is NEGATIVE SARS-CoV-2 target nucleic acids are NOT DETECTED. The SARS-CoV-2 RNA is generally detectable in upper and lower  respiratory specimens during the acute phase of infection. The lowest  concentration of SARS-CoV-2 viral copies this assay can detect is 250  copies / mL. A negative result does not preclude SARS-CoV-2 infection  and should not be used as the sole basis for treatment or other  patient management decisions.  A negative result may occur with  improper specimen collection / handling, submission of specimen other  than nasopharyngeal swab, presence of viral mutation(s) within the  areas targeted by  this assay, and inadequate number of viral copies  (<250 copies / mL). A negative result must be combined with clinical  observations, patient history, and epidemiological information. If result is POSITIVE SARS-CoV-2 target nucleic acids are DETECTED. The SARS-CoV-2 RNA is generally detectable in upper and lower  respiratory specimens dur ing the acute phase of infection.  Positive  results are indicative of active infection with SARS-CoV-2.  Clinical  correlation with patient history and other diagnostic information is  necessary to determine patient infection status.  Positive results do  not rule out bacterial infection or co-infection with other viruses. If result is PRESUMPTIVE POSTIVE SARS-CoV-2 nucleic acids MAY BE PRESENT.   A presumptive positive result was obtained on the submitted specimen  and confirmed on repeat testing.  While 2019 novel coronavirus  (SARS-CoV-2)  nucleic acids may be present in the submitted sample  additional confirmatory testing may be necessary for epidemiological  and / or clinical management purposes  to differentiate between  SARS-CoV-2 and other Sarbecovirus currently known to infect humans.  If clinically indicated additional testing with an alternate test  methodology [id number]) is advised. The SARS-CoV-2 RNA is generally  detectable in upper and lower respiratory sp ecimens during the acute  phase of infection. The expected result is Negative. Fact Sheet for Patients:  StrictlyIdeas.no Fact Sheet for Healthcare Providers: BankingDealers.co.za This test is not yet approved or cleared by the [place] FDA and has been authorized for detection and/or diagnosis of SARS-CoV-2 by FDA under an Emergency Use Authorization (EUA).  This EUA will remain in effect (meaning this test can be used) for the duration of the COVID-19 declaration under Section 564(b)(1) of the Act, 21 U.S.C. section 360bbb-3(b)(1), unless the authorization is terminated or revoked sooner. Performed at [facility], [place] [address], [place], [place] [address]   MRSA PCR Screening     Status: None   Collection Time: 10/07/19  5:45 PM   Specimen: Nasal Mucosa; Nasopharyngeal  Result Value Ref Range Status   MRSA by PCR NEGATIVE NEGATIVE Final    Comment:        The GeneXpert MRSA Assay (FDA approved for NASAL specimens only), is one component of a comprehensive MRSA colonization surveillance program. It is not intended to diagnose MRSA infection nor to guide or monitor treatment for MRSA infections. Performed at [facility], [place] [address] [place], [place] [address]     Radiology Studies: No results found.  25 minutes with more than 50% spent in reviewing records, counseling patient and coordinating care.  Augustin Bun T. [place]  If 7PM-7AM, please contact  night-coverage [email or web address] Password [email or web address] 10/12/2019, 5:04 PM

## 2019-10-12 NOTE — Progress Notes (Signed)
[facility]  Subjective  - feels ok mild left chest pain   Objective 129/65 82 98.5 F (36.9 C) (Oral) 18 96%  Intake/Output Summary (Last 24 hours) at 10/12/2019 0801 Last data filed at 10/11/2019 1954 Gross per 24 hour  Intake 480 ml  Output -  Net 480 ml   Palpable pedal pulses Abdomen soft NT  Assessment/Planning: CTA reviewed no real change  Will arrange outpt follow up with me and repeat CT in 3 months  OK for d/c from my standpoint if SBP remains below 140.  Would like to see HR in 60s but understand we may not have room for more meds with hypotension a few days ago.  Ruta Hinds 10/12/2019 8:01 AM --  Laboratory Lab Results: Recent Labs    10/11/19 0357 10/12/19 0356  WBC 8.8 7.9  HGB 13.1 12.7  HCT 37.2 36.8  PLT 201 206   BMET Recent Labs    10/11/19 0357 10/12/19 0356  NA 139 140  K 3.6 3.6  CL 106 108  CO2 22 23  GLUCOSE 94 107*  BUN 18 13  CREATININE 0.82 0.87  CALCIUM 8.2* 8.3*    COAG No results found for: INR, PROTIME No results found for: PTT

## 2019-10-12 NOTE — Plan of Care (Signed)
  Problem: Safety: Goal: Ability to remain free from injury will improve Outcome: Completed/Met

## 2019-10-12 NOTE — Progress Notes (Signed)
PT Cancellation Note  Patient Details Name: Melanie Nash MRN: [id number] DOB: Mar 12, 1946   Cancelled Treatment:    Reason Eval/Treat Not Completed: Other (comment). Pt reports she had walked in hallway earlier with nursing and deferred further ambulation   [place] 10/12/2019, 11:54 AM [place] Pager [phone] Office [phone]

## 2019-10-13 ENCOUNTER — Inpatient Hospital Stay (HOSPITAL_COMMUNITY): Payer: PPO

## 2019-10-13 DIAGNOSIS — R748 Abnormal levels of other serum enzymes: Secondary | ICD-10-CM

## 2019-10-13 DIAGNOSIS — R7989 Other specified abnormal findings of blood chemistry: Secondary | ICD-10-CM

## 2019-10-13 LAB — COMPREHENSIVE METABOLIC PANEL
ALT: 117 U/L — ABNORMAL HIGH (ref 0–44)
AST: 136 U/L — ABNORMAL HIGH (ref 15–41)
Albumin: 2.3 g/dL — ABNORMAL LOW (ref 3.5–5.0)
Alkaline Phosphatase: 186 U/L — ABNORMAL HIGH (ref 38–126)
Anion gap: 11 (ref 5–15)
BUN: 13 mg/dL (ref 8–23)
CO2: 22 mmol/L (ref 22–32)
Calcium: 8 mg/dL — ABNORMAL LOW (ref 8.9–10.3)
Chloride: 104 mmol/L (ref 98–111)
Creatinine, Ser: 0.74 mg/dL (ref 0.44–1.00)
GFR calc Af Amer: >60 mL/min (ref >60)
GFR calc non Af Amer: >60 mL/min (ref >60)
Glucose, Bld: 92 mg/dL (ref 70–99)
Potassium: 4.5 mmol/L (ref 3.5–5.1)
Sodium: 137 mmol/L (ref 135–145)
Total Bilirubin: 1.9 mg/dL — ABNORMAL HIGH (ref 0.3–1.2)
Total Protein: 5.2 g/dL — ABNORMAL LOW (ref 6.5–8.1)

## 2019-10-13 LAB — HEPATITIS PANEL, ACUTE
HCV Ab: NONREACTIVE
Hep A IgM: NONREACTIVE
Hep B C IgM: NONREACTIVE
Hepatitis B Surface Ag: NONREACTIVE

## 2019-10-13 LAB — BILIRUBIN, FRACTIONATED(TOT/DIR/INDIR)
Bilirubin, Direct: 0.4 mg/dL — ABNORMAL HIGH (ref 0.0–0.2)
Indirect Bilirubin: 0.9 mg/dL (ref 0.3–0.9)
Total Bilirubin: 1.3 mg/dL — ABNORMAL HIGH (ref 0.3–1.2)

## 2019-10-13 MED ORDER — LORAZEPAM 0.5 MG PO TABS
0.5000 mg | ORAL_TABLET | Freq: Once | ORAL | Status: AC
  Administered 2019-10-13: 0.5 mg via ORAL
  Filled 2019-10-13: qty 1

## 2019-10-13 MED ORDER — GADOBUTROL 1 MMOL/ML IV SOLN
7.0000 mL | Freq: Once | INTRAVENOUS | Status: AC | PRN
  Administered 2019-10-13: 7 mL via INTRAVENOUS

## 2019-10-13 NOTE — Care Management Important Message (Signed)
Important Message  Patient Details  Name: RILYN OSTENSON MRN: [id number] Date of Birth: 1946/09/13   Medicare Important Message Given:  Yes     Shelda Altes 10/13/2019, 2:08 PM

## 2019-10-13 NOTE — Progress Notes (Signed)
PROGRESS NOTE  Melanie Nash [id number] DOB: 1946-06-06   PCP: [name], MD  Patient is from: Home  DOA: 10/07/2019 LOS: 6  Brief Narrative / Interim history: 73yo with hx HTN, Hetal hernia, GERD, fibromyalgia, esophageal stricture s/p multiple dilations and diverticulosis.  She presented with significant left-sided chest pain radiating to her back with associated bilateral arm tingling and admitted with aortic dissection.  Felt she no longer needed antihypertensive medications and stopped.   Workup on admission; mild hypokalemia, hyperglycemia, and hypertensive BP 153/88. CT angio chest with long segment of circumferential aortic wall thickening extending from the proximal transvers arch to infrarenal abdominal aorta.   Pt was admitted to ICU service and continued on esmolol for improved BP control. Vascular Surgery was consulted and had been following. Pt's bp improved and pt remained stable for transfer to [facility] on 10/13.   Repeat CT on 10/13 with stable dissection, and 4 cm aneurysmal dilation of the distal arc of proximal descending thoracic segment.  Patient was cleared by vascular surgery for discharge and outpatient follow-up.  However, she had elevated liver enzymes without clear explanation.  GI consulted.  Subjective: No major events overnight of this morning.  No complaints this morning.  Very eager to go home.  Denies chest pain, dyspnea, abdominal pain, nausea, vomiting, diarrhea or UTI symptoms.   Objective: Vitals:   10/12/19 2332 10/13/19 0016 10/13/19 0309 10/13/19 0623  BP: 134/73 122/65 137/82 118/75  Pulse:   85   Resp:   17   Temp:   97.9 F (36.6 C)   TempSrc:   Oral   SpO2:   96%   Weight:   78.3 kg   Height:        Intake/Output Summary (Last 24 hours) at 10/13/2019 1248 Last data filed at 10/12/2019 2123 Gross per 24 hour  Intake 480 ml  Output -  Net 480 ml   Filed Weights   10/11/19 0655 10/12/19 0350 10/13/19 0309  Weight: 74.8 kg  77.3 kg 78.3 kg    Examination:  GENERAL: No acute distress.  Appears well.  HEENT: MMM.  Vision and hearing grossly intact.  No jaundice. NECK: Supple.  No apparent JVD.  RESP:  No IWOB. Good air movement bilaterally. CVS:  RRR. Heart sounds normal.  DP and radial pulses 2+ bilaterally. ABD/GI/GU: Bowel sounds present. Soft. Non tender.  Negative Murphy sign. MSK/EXT:  Moves extremities. No apparent deformity or edema.  SKIN: no apparent skin lesion or wound NEURO: Awake, alert and oriented appropriately.  No gross deficit.  PSYCH: Calm. Normal affect.   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 -BP and HR improved but not at goal. -Increase Coreg from 6.25 to 9.375 mg twice daily. -Change as needed hydralazine to as needed labetalol for better HR control. -Cleared by vascular surgery for discharge. -Evaluated by PT-no need identified. -CTS to arrange outpatient follow-up and repeat CT in 3 months.  Elevated liver enzymes: Unclear etiology.  No GI symptoms.  Abdominal exam reassuring.  Not a drinker.  RUQ ultrasound with biliary sludge and mild CBD to 1.1 cm but no signs of cholecystitis. -Acute hepatitis panel. -We will consult GI for insight.  GERD -Continue Protonix  Fibromyalgia -As needed pain medications   DVT prophylaxis: SCD Code Status: Partial (no CPR or defibrillation but  intubation and cardiac meds) Family Communication: Patient and/or RN. Available if any question. Disposition Plan: Remains inpatient due to elevated liver enzymes. Consultants: Cardiothoracic surgery (signed off).  GI.  Procedures:  None  Microbiology summarized: COVID-19 screen negative.  Sch Meds:  Scheduled Meds: . carvedilol  9.375 mg Oral BID WC  . feeding  supplement (ENSURE ENLIVE)  1 Bottle Oral TID BM  . loratadine  10 mg Oral Daily  . pantoprazole  40 mg Oral Daily   Continuous Infusions: PRN Meds:.acetaminophen, alum & mag hydroxide-simeth, hydrOXYzine, labetalol, morphine injection, ondansetron (ZOFRAN) IV, sodium chloride  Antimicrobials: Anti-infectives (From admission, onward)   None       I have personally reviewed the following labs and images: CBC: Recent Labs  Lab 10/07/19 0730 10/08/19 0213 10/11/19 0357 10/12/19 0356  WBC 8.9 13.7* 8.8 7.9  HGB 14.7 14.1 13.1 12.7  HCT 44.6 42.5 37.2 36.8  MCV 91.6 90.0 88.4 88.7  PLT 231 207 201 206   BMP &GFR Recent Labs  Lab 10/07/19 0730 10/08/19 0213 10/11/19 0357 10/12/19 0356 10/13/19 0311  NA 140 139 139 140 137  K 3.4* 4.0 3.6 3.6 4.5  CL 104 105 106 108 104  CO2 23 24 22 23 22   GLUCOSE 176* 129* 94 107* 92  BUN 13 17 18 13 13   CREATININE 0.90 0.88 0.82 0.87 0.74  CALCIUM 8.8* 8.5* 8.2* 8.3* 8.0*  MG  --  2.2 2.2  --   --   PHOS  --  3.8  --   --   --    Estimated Creatinine Clearance: 60.7 mL/min (by C-G formula based on SCr of 0.74 mg/dL). Liver & Pancreas: Recent Labs  Lab 10/11/19 0357 10/13/19 0311 10/13/19 0830  AST 53* 136*  --   ALT 52* 117*  --   ALKPHOS 92 186*  --   BILITOT 1.0 1.9* 1.3*  PROT 5.3* 5.2*  --   ALBUMIN 2.5* 2.3*  --    Recent Labs  Lab 10/07/19 0921  LIPASE 18   No results for input(s): AMMONIA in the last 168 hours. Diabetic: No results for input(s): HGBA1C in the last 72 hours. No results for input(s): GLUCAP in the last 168 hours. Cardiac Enzymes: No results for input(s): CKTOTAL, CKMB, CKMBINDEX, TROPONINI in the last 168 hours. No results for input(s): PROBNP in the last 8760 hours. Coagulation Profile: No results for input(s): INR, PROTIME in the last 168 hours. Thyroid Function Tests: No results for input(s): TSH, T4TOTAL, FREET4, T3FREE, THYROIDAB in the last 72 hours. Lipid Profile: No results for  input(s): CHOL, HDL, LDLCALC, TRIG, CHOLHDL, LDLDIRECT in the last 72 hours. Anemia Panel: No results for input(s): VITAMINB12, FOLATE, FERRITIN, TIBC, IRON, RETICCTPCT in the last 72 hours. Urine analysis:    Component Value Date/Time   COLORURINE YELLOW 01/25/2017 0017   APPEARANCEUR CLEAR 01/25/2017 0017   LABSPEC 1.041 (H) 01/25/2017 0017   PHURINE 6.0 01/25/2017 0017   GLUCOSEU NEGATIVE 01/25/2017 0017   HGBUR MODERATE (A) 01/25/2017 0017   BILIRUBINUR NEGATIVE 01/25/2017 0017   KETONESUR 5 (A) 01/25/2017 0017   PROTEINUR NEGATIVE 01/25/2017 0017   UROBILINOGEN 0.2 10/06/2009 0001   NITRITE NEGATIVE 01/25/2017 0017   LEUKOCYTESUR TRACE (A) 01/25/2017 0017   Sepsis Labs: Invalid input(s): PROCALCITONIN, [place]  Microbiology: Recent Results (from the past 240 hour(s))  SARS Coronavirus 2 by RT PCR (hospital order, performed in [facility] hospital lab) Nasopharyngeal Nasopharyngeal Swab     Status: None  Collection Time: 10/07/19  3:30 PM   Specimen: Nasopharyngeal Swab  Result Value Ref Range Status   SARS Coronavirus 2 NEGATIVE NEGATIVE Final    Comment: (NOTE) If result is NEGATIVE SARS-CoV-2 target nucleic acids are NOT DETECTED. The SARS-CoV-2 RNA is generally detectable in upper and lower  respiratory specimens during the acute phase of infection. The lowest  concentration of SARS-CoV-2 viral copies this assay can detect is 250  copies / mL. A negative result does not preclude SARS-CoV-2 infection  and should not be used as the sole basis for treatment or other  patient management decisions.  A negative result may occur with  improper specimen collection / handling, submission of specimen other  than nasopharyngeal swab, presence of viral mutation(s) within the  areas targeted by this assay, and inadequate number of viral copies  (<250 copies / mL). A negative result must be combined with clinical  observations, patient history, and epidemiological  information. If result is POSITIVE SARS-CoV-2 target nucleic acids are DETECTED. The SARS-CoV-2 RNA is generally detectable in upper and lower  respiratory specimens dur ing the acute phase of infection.  Positive  results are indicative of active infection with SARS-CoV-2.  Clinical  correlation with patient history and other diagnostic information is  necessary to determine patient infection status.  Positive results do  not rule out bacterial infection or co-infection with other viruses. If result is PRESUMPTIVE POSTIVE SARS-CoV-2 nucleic acids MAY BE PRESENT.   A presumptive positive result was obtained on the submitted specimen  and confirmed on repeat testing.  While 2019 novel coronavirus  (SARS-CoV-2) nucleic acids may be present in the submitted sample  additional confirmatory testing may be necessary for epidemiological  and / or clinical management purposes  to differentiate between  SARS-CoV-2 and other Sarbecovirus currently known to infect humans.  If clinically indicated additional testing with an alternate test  methodology [phone]) is advised. The SARS-CoV-2 RNA is generally  detectable in upper and lower respiratory sp ecimens during the acute  phase of infection. The expected result is Negative. Fact Sheet for Patients:  StrictlyIdeas.no Fact Sheet for Healthcare Providers: BankingDealers.co.za This test is not yet approved or cleared by the [place] FDA and has been authorized for detection and/or diagnosis of SARS-CoV-2 by FDA under an Emergency Use Authorization (EUA).  This EUA will remain in effect (meaning this test can be used) for the duration of the COVID-19 declaration under Section 564(b)(1) of the Act, 21 U.S.C. section 360bbb-3(b)(1), unless the authorization is terminated or revoked sooner. Performed at [facility], [place] [address] [place], [place] [address]   MRSA PCR Screening      Status: None   Collection Time: 10/07/19  5:45 PM   Specimen: Nasal Mucosa; Nasopharyngeal  Result Value Ref Range Status   MRSA by PCR NEGATIVE NEGATIVE Final    Comment:        The GeneXpert MRSA Assay (FDA approved for NASAL specimens only), is one component of a comprehensive MRSA colonization surveillance program. It is not intended to diagnose MRSA infection nor to guide or monitor treatment for MRSA infections. Performed at [facility], [place] [address] [place], [place] [address]     Radiology Studies: US Abdomen Limited Ruq  Result Date: 10/13/2019 CLINICAL DATA:  Elevated LFTs.  History of hypertension. EXAM: ULTRASOUND ABDOMEN LIMITED RIGHT UPPER QUADRANT COMPARISON:  10/11/2019, 10/07/2019, and 01/25/2017 CT FINDINGS: Gallbladder: Gallbladder has a normal appearance. Gallbladder wall is 1.5 millimeters, within  normal limits. Sludge is present within the gallbladder. No discrete stones or pericholecystic fluid. No sonographic Murphy sign. Common bile duct: Diameter: 1.1 centimeters.  No choledocholithiasis. Liver: No focal lesion identified. Within normal limits in parenchymal echogenicity. Portal vein is patent on color Doppler imaging with normal direction of blood flow towards the liver. Other: Note is made of trace RIGHT pleural effusion. Parapelvic cysts identified within the RIGHT kidney. IMPRESSION: 1. Gallbladder sludge without evidence for acute cholecystitis. 2. Mild dilatation of the common bile duct without obvious choledocholithiasis. Electronically Signed   By: Nolon Nations M.D.   On: 10/13/2019 09:52    Taye T. [place]  If 7PM-7AM, please contact night-coverage [email or web address] Password [email or web address] 10/13/2019, 12:48 PM

## 2019-10-13 NOTE — Clinical Social Work Note (Signed)
CSW received verbal permission for the pt to provide Elsie Stain with "[employer] Place [employer]" with pt's contact information. Vita Barley will reach out to patient and explain resources available to pt for assistance in the home. Vita Barley will continue to work with pt after d/c.  [place], [place]

## 2019-10-13 NOTE — Progress Notes (Signed)
Attempted to bring pt down for exam. Per transport, there is an issue w/ a family member at bedside. Unable to send for pt at this time per RN. RN to call back when pt is ready.

## 2019-10-13 NOTE — Consult Note (Signed)
Referring Provider: Dr. Cyndia Skeeters, [facility] Primary Care Physician:  [name], MD Primary Gastroenterologist:  Dr. Ardis Hughs  Reason for Consultation:  Elevated LFT's, dilated bile duct  HPI: Melanie Nash is a 73 y.o. 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  Seen by CT surgery and cleared for discharge today, but it was noted on labs that liver enzymes were elevated with AST 136, ALT 117, ALP 186, and total bili of 1.9.  Two days ago total bili and ALP were normal with AST of 53 and ALT of 52.  It appears that transaminases have been elevated like this slightly in the past as well.  Ultrasound was ordered and showed sludge in the gallbladder with a dilated CBD at 1.1 cm.  GI was consulted.  Patient denies abdominal pain and wants to go home.  Acute viral hepatitis panel is pending.   Past Medical History:  Diagnosis Date  . Allergy   . Arthritis   . Diverticulosis   . Esophageal stricture   . Fibromyalgia   . Fibromyalgia   . GERD (gastroesophageal reflux disease)   . Hiatal hernia   . Hypertension    pt not taking prescribed bp med  . Seasonal allergies    sinus infection    Past Surgical History:  Procedure Laterality Date  . COLONOSCOPY    . ESOPHAGOGASTRODUODENOSCOPY     dysphagia  . ESOPHAGOGASTRODUODENOSCOPY N/A 02/26/2016   Procedure: ESOPHAGOGASTRODUODENOSCOPY (EGD);  Surgeon: Milus Banister, MD;  Location: [facility];  Service: Endoscopy;  Laterality: N/A;    Prior to Admission medications   Medication Sig Start Date End Date Taking? Authorizing Provider  acetaminophen (TYLENOL) 500 MG tablet Take 1 tablet (500 mg total) by mouth every 6 (six) hours as needed for mild pain (or Fever >/= 101). 01/30/17  Yes Regalado, Belkys A, MD  omeprazole (PRILOSEC) 20 MG capsule TAKE 1 CAPSULE BY MOUTH EVERY NIGHT AT  BEDTIME' Patient taking differently: Take 20 mg by mouth at bedtime.  09/26/19  Yes Milus Banister, MD  famotidine (PEPCID) 40 MG tablet Take 1 tablet by mouth every morning. Patient not taking: Reported on 10/07/2019 12/28/18   Alfredia Ferguson, PA-C    Current Facility-Administered Medications  Medication Dose Route Frequency Provider Last Rate Last Dose  . acetaminophen (TYLENOL) tablet 650 mg  650 mg Oral Q4H PRN Merlene Laughter F, NP   650 mg at 10/13/19 0526  . alum & mag hydroxide-simeth (MAALOX/MYLANTA) 200-200-20 MG/5ML suspension 15 mL  15 mL Oral Q6H PRN Gardiner Barefoot, NP   15 mL at 10/12/19 2123  . carvedilol (COREG) tablet 9.375 mg  9.375 mg Oral BID WC Gonfa, Taye T, MD   9.375 mg at 10/13/19 1046  . feeding supplement (ENSURE ENLIVE) (ENSURE ENLIVE) liquid 237 mL  1 Bottle Oral TID BM Elam Dutch, MD   237 mL at 10/12/19 1400  . hydrOXYzine (ATARAX/VISTARIL) tablet 10 mg  10 mg Oral TID PRN Donne Hazel, MD   10 mg at 10/12/19 2337  . labetalol (NORMODYNE) injection 10 mg  10 mg Intravenous Q2H PRN Wendee Beavers T, MD   10 mg at 10/13/19 0522  . loratadine (CLARITIN) tablet 10 mg  10 mg Oral Daily Donne Hazel, MD   10 mg at 10/13/19 1046  . morphine  2 MG/ML injection 1-2 mg  1-2 mg Intravenous Q3H PRN Rigoberto Noel, MD   2 mg at 10/07/19 2310  . ondansetron (ZOFRAN) injection 4 mg  4 mg Intravenous Q6H PRN Merlene Laughter F, NP      . pantoprazole (PROTONIX) EC tablet 40 mg  40 mg Oral Daily Merlene Laughter F, NP   40 mg at 10/13/19 1047  . sodium chloride (OCEAN) 0.65 % nasal spray 2 spray  2 spray Each Nare PRN Julian Hy, DO        Allergies as of 10/07/2019  . (No Known Allergies)    Family History  Problem Relation Age of Onset  . Heart disease Mother   . Diabetes Sister   . Diabetes Maternal Uncle   . AAA (abdominal aortic aneurysm) Father   . Colon cancer Neg Hx   . Stomach cancer Neg Hx     Social History   Socioeconomic History  .  Marital status: Married    Spouse name: Not on file  . Number of children: 2  . Years of education: Not on file  . Highest education level: Not on file  Occupational History  . Occupation: Engineer, agricultural Needs  . Financial resource strain: Not on file  . Food insecurity    Worry: Not on file    Inability: Not on file  . Transportation needs    Medical: Not on file    Non-medical: Not on file  Tobacco Use  . Smoking status: Never Smoker  . Smokeless tobacco: Never Used  Substance and Sexual Activity  . Alcohol use: No    Alcohol/week: 0.0 standard drinks  . Drug use: No  . Sexual activity: Not on file  Lifestyle  . Physical activity    Days per week: Not on file    Minutes per session: Not on file  . Stress: Not on file  Relationships  . Social Herbalist on phone: Not on file    Gets together: Not on file    Attends religious service: Not on file    Active member of club or organization: Not on file    Attends meetings of clubs or organizations: Not on file    Relationship status: Not on file  . Intimate partner violence    Fear of current or ex partner: Not on file    Emotionally abused: Not on file    Physically abused: Not on file    Forced sexual activity: Not on file  Other Topics Concern  . Not on file  Social History Narrative  . Not on file    Review of Systems: ROS is O/W negative except as mentioned in HPI.  Physical Exam: Vital signs in last 24 hours: Temp:  [97.9 F (36.6 C)-98.1 F (36.7 C)] 97.9 F (36.6 C) (10/15 0309) Pulse Rate:  [79-89] 85 (10/15 0309) Resp:  [17] 17 (10/15 0309) BP: (118-159)/(65-94) 118/75 (10/15 0623) SpO2:  [96 %] 96 % (10/15 0309) Weight:  [78.3 kg] 78.3 kg (10/15 0309) Last BM Date: 10/11/19 General:  Alert, Well-developed, well-nourished, pleasant and cooperative in NAD Head:  Normocephalic and atraumatic. Eyes:  Sclera clear, no icterus.  Conjunctiva pink. Ears:  Normal auditory acuity. Mouth:  No  deformity or lesions.   Lungs:  Clear throughout to auscultation.  No wheezes, crackles, or rhonchi.  Heart:  Regular rate and rhythm; no murmurs, clicks, rubs, or gallops. Abdomen:  Soft, non-distended.  BS present.  Non-tender.  Msk:  Symmetrical without gross deformities. Pulses:  Normal pulses noted. Extremities:  Without clubbing or edema. Neurologic:  Alert and oriented x 4;  grossly normal neurologically. Skin:  Intact without significant lesions or rashes. Psych:  Alert and cooperative. Normal mood and affect.  Intake/Output from previous day: 10/14 0701 - 10/15 0700 In: 840 [P.O.:840] Out: -   Lab Results: Recent Labs    10/11/19 0357 10/12/19 0356  WBC 8.8 7.9  HGB 13.1 12.7  HCT 37.2 36.8  PLT 201 206   BMET Recent Labs    10/11/19 0357 10/12/19 0356 10/13/19 0311  NA 139 140 137  K 3.6 3.6 4.5  CL 106 108 104  CO2 22 23 22   GLUCOSE 94 107* 92  BUN 18 13 13   CREATININE 0.82 0.87 0.74  CALCIUM 8.2* 8.3* 8.0*   LFT Recent Labs    10/13/19 0311 10/13/19 0830  PROT 5.2*  --   ALBUMIN 2.3*  --   AST 136*  --   ALT 117*  --   ALKPHOS 186*  --   BILITOT 1.9* 1.3*  BILIDIR  --  0.4*  IBILI  --  0.9   Studies/Results: US Abdomen Limited Ruq  Result Date: 10/13/2019 CLINICAL DATA:  Elevated LFTs.  History of hypertension. EXAM: ULTRASOUND ABDOMEN LIMITED RIGHT UPPER QUADRANT COMPARISON:  10/11/2019, 10/07/2019, and 01/25/2017 CT FINDINGS: Gallbladder: Gallbladder has a normal appearance. Gallbladder wall is 1.5 millimeters, within normal limits. Sludge is present within the gallbladder. No discrete stones or pericholecystic fluid. No sonographic Murphy sign. Common bile duct: Diameter: 1.1 centimeters.  No choledocholithiasis. Liver: No focal lesion identified. Within normal limits in parenchymal echogenicity. Portal vein is patent on color Doppler imaging with normal direction of blood flow towards the liver. Other: Note is made of trace RIGHT pleural  effusion. Parapelvic cysts identified within the RIGHT kidney. IMPRESSION: 1. Gallbladder sludge without evidence for acute cholecystitis. 2. Mild dilatation of the common bile duct without obvious choledocholithiasis. Electronically Signed   By: Nolon Nations M.D.   On: 10/13/2019 09:52   IMPRESSION:  *Elevated LFT's with dilated bile duct on ultrasound at 1.1 cm and gallbladder sludge.  No abdominal pain.  Total bili is 1.9, AST 136, ALT 117, and ALP 186.  No abdominal pain.  Appears that she's had some mild elevations in the past with transaminases in the 50's and total bili 1.4 or so.  Acute hepatitis panel pending.  Rule out biliary source but suspect LFT elevation could be do to ischemia from her aortic dissection as the dissection extends to the infrarenal abdominal aorta, which is well past the celiac artery that supplies the liver. 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  Observation.  Cleared by vascular for discharge.  PLAN: -MRCP/MRI abdomen to rule out biliary source.  This was ordered STAT.  If negative then she could certainly follow-up regarding LFT abnormalities as an outpatient. -Trend LFT's.   Laban Emperor. Job Holtsclaw  10/13/2019, 1:32 PM

## 2019-10-13 NOTE — Progress Notes (Signed)
PT Cancellation Note  Patient Details Name: Melanie Nash MRN: [id number] DOB: [birth year]   Cancelled Treatment:    Reason Eval/Treat Not Completed: Other (comment). Per pt and staff pt has been amb in hallways without difficulty. Will sign off.   Shary Decamp [facility] 10/13/2019, 12:56 PM Antwann Preziosi [place] Pager [phone] Office [phone]

## 2019-10-14 ENCOUNTER — Telehealth: Payer: Self-pay

## 2019-10-14 ENCOUNTER — Other Ambulatory Visit: Payer: Self-pay

## 2019-10-14 DIAGNOSIS — F439 Reaction to severe stress, unspecified: Secondary | ICD-10-CM

## 2019-10-14 DIAGNOSIS — F411 Generalized anxiety disorder: Secondary | ICD-10-CM

## 2019-10-14 LAB — PROTIME-INR
INR: 1.1 (ref 0.8–1.2)
Prothrombin Time: 14.5 seconds (ref 11.4–15.2)

## 2019-10-14 LAB — COMPREHENSIVE METABOLIC PANEL
ALT: 139 U/L — ABNORMAL HIGH (ref 0–44)
AST: 111 U/L — ABNORMAL HIGH (ref 15–41)
Albumin: 2.3 g/dL — ABNORMAL LOW (ref 3.5–5.0)
Alkaline Phosphatase: 212 U/L — ABNORMAL HIGH (ref 38–126)
Anion gap: 9 (ref 5–15)
BUN: 13 mg/dL (ref 8–23)
CO2: 21 mmol/L — ABNORMAL LOW (ref 22–32)
Calcium: 7.9 mg/dL — ABNORMAL LOW (ref 8.9–10.3)
Chloride: 105 mmol/L (ref 98–111)
Creatinine, Ser: 0.84 mg/dL (ref 0.44–1.00)
GFR calc Af Amer: >60 mL/min (ref >60)
GFR calc non Af Amer: >60 mL/min (ref >60)
Glucose, Bld: 118 mg/dL — ABNORMAL HIGH (ref 70–99)
Potassium: 3.8 mmol/L (ref 3.5–5.1)
Sodium: 135 mmol/L (ref 135–145)
Total Bilirubin: 1.1 mg/dL (ref 0.3–1.2)
Total Protein: 5.4 g/dL — ABNORMAL LOW (ref 6.5–8.1)

## 2019-10-14 LAB — CBC
HCT: 37.2 % (ref 36.0–46.0)
Hemoglobin: 12.3 g/dL (ref 12.0–15.0)
MCH: 30.1 pg (ref 26.0–34.0)
MCHC: 33.1 g/dL (ref 30.0–36.0)
MCV: 91.2 fL (ref 80.0–100.0)
Platelets: 267 10*3/uL (ref 150–400)
RBC: 4.08 MIL/uL (ref 3.87–5.11)
RDW: 13.8 % (ref 11.5–15.5)
WBC: 8.6 10*3/uL (ref 4.0–10.5)
nRBC: 0 % (ref 0.0–0.2)

## 2019-10-14 LAB — MAGNESIUM: Magnesium: 2.2 mg/dL (ref 1.7–2.4)

## 2019-10-14 MED ORDER — CARVEDILOL 12.5 MG PO TABS
12.5000 mg | ORAL_TABLET | Freq: Two times a day (BID) | ORAL | Status: DC
  Filled 2019-10-14: qty 1

## 2019-10-14 MED ORDER — CARVEDILOL 12.5 MG PO TABS: 12.5000 mg | ORAL_TABLET | Freq: Two times a day (BID) | ORAL | 1 refills | Status: AC

## 2019-10-14 MED FILL — CARVEDILOL 12.5 MG TABLET: 12.5 | 90 days supply | Qty: 180 | Fill #0

## 2019-10-14 NOTE — Telephone Encounter (Signed)
-----   Message from [name], MD sent at 10/13/2019  8:09 PM EDT ----- Melanie Nash patient seen for elevated LFTs in the hospital  Requesting referral to behavioral health due to stress and anxiety.  Please place this referral  Thanks

## 2019-10-14 NOTE — Telephone Encounter (Signed)
Referral has been placed for BH 

## 2019-10-14 NOTE — Discharge Summary (Signed)
Physician Discharge Summary  Melanie Nash [id number] DOB: [birth year] DOA: 10/07/2019  PCP: [name], MD  Admit date: 10/07/2019 Discharge date: 10/14/2019  Admitted From: Home Disposition: Home  Recommendations for Outpatient Follow-up:  1. Follow up with PCP in 1-2 weeks 2. Follow-up with vascular surgery in 3 months 3. Gastroenterology to schedule outpatient follow-up 4. Please obtain CBC/BMP/Mag at follow up 5. Please follow up on the following pending results: None  Home Health: None Equipment/Devices: None  Discharge Condition: Stable CODE STATUS: Full code  Hospital Course: 73yo with hx HTN, Hetal hernia, GERD, fibromyalgia, esophageal stricture s/p multiple dilations and diverticulosis.  She presented with significant left-sided chest pain radiating to her back with associated bilateral arm tingling and admitted with aortic dissection.  Felt she no longer needed antihypertensive medications and stopped.   Workup on admission; mild hypokalemia, hyperglycemia, and hypertensive BP 153/88. CT angio chest with long segment of circumferential aortic wall thickening extending from the proximal transvers arch to infrarenal abdominal aorta.   Pt was admitted to ICU service and continued on esmolol for improved BP control. Vascular Surgery was consulted and had been following. Pt's bp improved and pt remained stable for transfer to progressive care unit on 10/13.   Repeat CT on 10/13 with stable dissection, and 4 cm aneurysmal dilation of the distal arc of proximal descending thoracic segment.  Patient was cleared by vascular surgery for discharge and outpatient follow-up.  However, she had elevated liver enzymes without clear explanation.  GI consulted and recommended MRCP which was unremarkable.  The next day, LFT remained stable.  Hyperbilirubinemia resolved.  Cleared for discharge by gastroenterology, who will arrange outpatient follow-up.  See individual problem list  below for more hospital course.  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 -Discharged on Coreg 12.5 mg twice daily. -Goal SBP less than 130 mmHg.  HR preferably in 60s to 70s. -Cleared by vascular surgery for discharge. -Evaluated by PT-no need identified. -CTS to arrange outpatient follow-up and repeat CT in 3 months.  Elevated liver enzymes/hyperbilirubinemia: Unclear etiology.  No GI symptoms.  Abdominal exam reassuring.  Not a drinker.  RUQ ultrasound with biliary sludge and mild CBD to 1.1 cm but no signs of cholecystitis.  Acute hepatitis panel negative.  PT/INR within normal range.  GI consulted.  MRCP not remarkable.  LFT remained stable.  Hyperbilirubinemia resolved. -Cleared for discharge by GI -GI to schedule outpatient follow-up.  GERD -Continue PPI.  Fibromyalgia -As needed pain medications  Stress: spouse with dementia cared by patient.  -Could benefit from behavioral health referral.  Discharge Instructions  Discharge Instructions    Call MD for:  extreme fatigue   Complete by: As directed    Call MD for:  persistant dizziness or light-headedness   Complete by: As directed    Call MD for:  persistant nausea and vomiting   Complete by: As directed    Call MD for:  severe uncontrolled pain   Complete by: As directed    Diet - low sodium heart healthy   Complete by: As directed    Discharge instructions   Complete by: As directed    It has been a pleasure taking care of you! You were hospitalized with aortic dissection which is a stable now.  The treatment for this is having your  blood pressure and heart rate under good control.  We have restarted you on blood pressure medication that you need to continue taking.  Please review your new  medication list and the directions before you take your medications. Please call to schedule follow-up with your primary care doctor, gastroenterologist and vascular surgery as recommended.  Take care,   Increase activity slowly   Complete by: As directed      Allergies as of 10/14/2019   No Known Allergies     Medication List    STOP taking these medications   acetaminophen 500 MG tablet Commonly known as: TYLENOL   famotidine 40 MG tablet Commonly known as: Pepcid     TAKE these medications   carvedilol 12.5 MG tablet Commonly known as: COREG Take 1 tablet (12.5 mg total) by mouth 2 (two) times daily with a meal.   omeprazole 20 MG capsule Commonly known as: PRILOSEC TAKE 1 CAPSULE BY MOUTH EVERY NIGHT AT BEDTIME' What changed: See the new instructions.      Follow-up Information    Melanie Dutch, MD In 3 months.   Specialties: Vascular Surgery, Cardiology Why: Office will call you to arrange your appt (sent) Contact information: [place] [address] [phone]        Melanie Bears, MD. Schedule an appointment as soon as possible for a visit in 2 week(s).   Specialty: Gastroenterology Contact information: [address] [place] [address] [phone]        [name], MD. Schedule an appointment as soon as possible for a visit in 1 week(s).   Specialty: Family Medicine Contact information: [place] [address] [phone]           Consultations:  Vascular surgery  Gastroenterology  Procedures/Studies:  2D Echo on 10/08/2019  1. Left ventricular ejection fraction, by visual estimation, is 60 to 65%. The left ventricle has normal function. Normal left ventricular size. There is mildly increased left ventricular hypertrophy.  2. Left ventricular diastolic Doppler parameters are consistent with impaired relaxation pattern of LV diastolic filling.  3. Global right ventricle has normal  systolic function.The right ventricular size is normal. No increase in right ventricular wall thickness.  4. Left atrial size was normal.  5. Right atrial size was normal.  6. The mitral valve is normal in structure. No evidence of mitral valve regurgitation. No evidence of mitral stenosis.  7. The tricuspid valve is normal in structure. Tricuspid valve regurgitation is mild.  8. The aortic valve is normal in structure. Aortic valve regurgitation was not visualized by color flow Doppler. Structurally normal aortic valve, with no evidence of sclerosis or stenosis.  9. The pulmonic valve was normal in structure. Pulmonic valve regurgitation is not visualized by color flow Doppler. 10. Normal pulmonary artery systolic pressure. 11. The inferior vena cava is normal in size with greater than 50% respiratory variability, suggesting right atrial pressure of 3 mmHg.  Mr 3d Recon At Scanner  Result Date: 10/14/2019 CLINICAL DATA:  Elevated liver function tests. Biliary ductal dilatation on recent ultrasound. EXAM: MRI ABDOMEN WITHOUT AND WITH CONTRAST (INCLUDING MRCP) TECHNIQUE: Multiplanar multisequence MR imaging of the abdomen was performed both before and after the administration of intravenous contrast. Heavily T2-weighted images of the biliary and pancreatic ducts were obtained, and three-dimensional MRCP images were rendered by post processing. CONTRAST:  32mL GADAVIST GADOBUTROL 1 MMOL/ML IV SOLN COMPARISON:  CTA on 10/11/2019 FINDINGS: Lower chest: Small bilateral pleural  effusions. Hepatobiliary: No hepatic masses identified. No evidence of steatosis. Mild diffuse biliary ductal dilatation is seen, with common bile duct measuring 9 mm. No evidence of choledocholithiasis or biliary stricture. Pancreas: No mass or inflammatory changes. No evidence of pancreatic ductal dilatation or pancreas divisum. Spleen:  Within normal limits in size and appearance. Adrenals/Urinary Tract: No masses identified. No  evidence of hydronephrosis. Stomach/Bowel: Small hiatal hernia.  Otherwise unremarkable. Vascular/Lymphatic: No pathologically enlarged lymph nodes identified. No abdominal aortic aneurysm. Other:  None. Musculoskeletal:  No suspicious bone lesions identified. IMPRESSION: Mild diffuse biliary ductal dilatation, with common bile duct measuring 9 mm. No radiographic evidence of choledocholithiasis or other obstructing etiology. Small hiatal hernia. Electronically Signed   By: Marlaine Hind M.D.   On: 10/14/2019 07:52   Dg Chest Port 1 View  Result Date: 10/07/2019 CLINICAL DATA:  Chest pain today. EXAM: PORTABLE CHEST 1 VIEW COMPARISON:  None. FINDINGS: The aorta is tortuous. The heart size is normal. There is mild opacity of the medial right lung base. There is no pleural effusion or pulmonary edema. Bony structures are normal. IMPRESSION: Mild patchy opacity of medial right lung base, developing pneumonia is not excluded. Electronically Signed   By: Abelardo Diesel M.D.   On: 10/07/2019 08:29   Mr Abdomen Mrcp Moise Boring Contast  Result Date: 10/14/2019 CLINICAL DATA:  Elevated liver function tests. Biliary ductal dilatation on recent ultrasound. EXAM: MRI ABDOMEN WITHOUT AND WITH CONTRAST (INCLUDING MRCP) TECHNIQUE: Multiplanar multisequence MR imaging of the abdomen was performed both before and after the administration of intravenous contrast. Heavily T2-weighted images of the biliary and pancreatic ducts were obtained, and three-dimensional MRCP images were rendered by post processing. CONTRAST:  68mL GADAVIST GADOBUTROL 1 MMOL/ML IV SOLN COMPARISON:  CTA on 10/11/2019 FINDINGS: Lower chest: Small bilateral pleural effusions. Hepatobiliary: No hepatic masses identified. No evidence of steatosis. Mild diffuse biliary ductal dilatation is seen, with common bile duct measuring 9 mm. No evidence of choledocholithiasis or biliary stricture. Pancreas: No mass or inflammatory changes. No evidence of pancreatic ductal  dilatation or pancreas divisum. Spleen:  Within normal limits in size and appearance. Adrenals/Urinary Tract: No masses identified. No evidence of hydronephrosis. Stomach/Bowel: Small hiatal hernia.  Otherwise unremarkable. Vascular/Lymphatic: No pathologically enlarged lymph nodes identified. No abdominal aortic aneurysm. Other:  None. Musculoskeletal:  No suspicious bone lesions identified. IMPRESSION: Mild diffuse biliary ductal dilatation, with common bile duct measuring 9 mm. No radiographic evidence of choledocholithiasis or other obstructing etiology. Small hiatal hernia. Electronically Signed   By: Marlaine Hind M.D.   On: 10/14/2019 07:52   Ct Angio Chest/abd/pel For Dissection W And/or W/wo  Result Date: 10/11/2019 CLINICAL DATA:  Aortic dissection, follow-up EXAM: CT ANGIOGRAPHY CHEST, ABDOMEN AND PELVIS TECHNIQUE: Multidetector CT imaging through the chest, abdomen and pelvis was performed using the standard protocol during bolus administration of intravenous contrast. Multiplanar reconstructed images and MIPs were obtained and reviewed to evaluate the vascular anatomy. CONTRAST:  156mL OMNIPAQUE IOHEXOL 350 MG/ML SOLN COMPARISON:  10/07/2019 FINDINGS: CTA CHEST FINDINGS Cardiovascular: Heart size normal. No pericardial effusion. Satisfactory opacification of pulmonary arteries noted, and there is no evidence of pulmonary emboli. There is good contrast opacification of the thoracic aorta. Ascending thoracic segment and proximal arch unremarkable. Classic 3 vessel brachiocephalic arterial origin anatomy without proximal stenosis. Just beyond the left subclavian artery origin there is eccentric circumferential thickening of the aortic wall which is hyperdense on the noncontrast scan consistent with intramural hematoma or thrombosed false lumen of dissection.  There is no compromise of the true lumen. There is 4 cm dilatation of the distal arch extending through the mid descending segment, which tapers  to 3.1 cm above the diaphragm. No leak. Mediastinum/Nodes: No hilar or mediastinal adenopathy. Lungs/Pleura: Trace bilateral pleural effusions. Dependent atelectasis posteriorly in both lower lobes. No pneumothorax. Musculoskeletal: Left shoulder DJD. No fracture or worrisome bone lesion. Review of the MIP images confirms the above findings. CTA ABDOMEN AND PELVIS FINDINGS VASCULAR Aorta: Persistent eccentric wall thickening extends through the infrarenal aorta terminating at the level of the IMA origin. No significant compromise of the true lumen. No aneurysm. No leak. Scattered calcified atheromatous plaque. Celiac: Tapered short-segment narrowing in the proximal celiac axis, of possible hemodynamic significance, patent distally. SMA: Calcified ostial plaque without stenosis. Replaced right hepatic arterial supply, an anatomic variant. Patent distally. Renals: Duplicated right, superior dominant, both patent. Duplicated left, inferior dominant, both patent. IMA: Patent without evidence of aneurysm, dissection, vasculitis or significant stenosis. Inflow: Patent without evidence of aneurysm, dissection, vasculitis or significant stenosis. Veins: No obvious venous abnormality within the limitations of this arterial phase study. Review of the MIP images confirms the above findings. NON-VASCULAR Hepatobiliary: No focal liver abnormality is seen. No gallstones, gallbladder wall thickening, or biliary dilatation. Pancreas: Unremarkable. No pancreatic ductal dilatation or surrounding inflammatory changes. Spleen: Normal in size without focal abnormality. Adrenals/Urinary Tract: Normal adrenals. Symmetric bilateral renal parenchymal enhancement. No hydronephrosis. Urinary bladder physiologically distended. Stomach/Bowel: Small hiatal hernia. Stomach and bowel are nondistended. Normal appendix. The colon is nondilated. A few sigmoid diverticula without inflammatory/edematous changes or abscess. Lymphatic: No abdominal or  pelvic adenopathy. Reproductive: Uterus and bilateral adnexa are unremarkable. Other: No ascites. No free air. Musculoskeletal: Lumbar spondylitic changes most marked L4-5. Sacral Tarlov cysts. No fracture or worrisome bone lesion. Review of the MIP images confirms the above findings. IMPRESSION: 1. Intramural aortic hematoma or thrombosed type B dissection, probably extending to the infrarenal abdominal aorta, stable since prior scan. No evidence of branch vessel involvement nor compromise of the true lumen. 2. 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 Recommend semi-annual imaging followup by CTA or MRA and referral to cardiothoracic surgery if not already obtained. This recommendation follows 2010 ACCF/AHA/AATS/ACR/ASA/SCA/SCAI/SIR/STS/SVM Guidelines for the Diagnosis and Management of Patients With Thoracic Aortic Disease. Circulation. 2010; 121: e266-e36 3. Trace bilateral pleural effusions. 4. Small hiatal hernia. 5. Sigmoid diverticulosis. Electronically Signed   By: Lucrezia Europe M.D.   On: 10/11/2019 15:57   Ct Angio Chest/abd/pel For Dissection W And/or Wo Contrast  Result Date: 10/07/2019 CLINICAL DATA:  Back pain, acute chest pain. EXAM: CT ANGIOGRAPHY CHEST, ABDOMEN AND PELVIS TECHNIQUE: Multidetector CT imaging through the chest, abdomen and pelvis was performed using the standard protocol during bolus administration of intravenous contrast. Multiplanar reconstructed images and MIPs were obtained and reviewed to evaluate the vascular anatomy. CONTRAST:  112mL OMNIPAQUE IOHEXOL 350 MG/ML SOLN COMPARISON:  None. FINDINGS: CTA CHEST FINDINGS Cardiovascular: Non IV contrast images demonstrates a subtle high attenuation rim surrounding the transverse and descending aorta (image 26/4 for example and image 16/4). On contrast phase series, this corresponds to a nonenhancing thickened aortic wall measuring 7 mm through the arch (image 21/7 and 6 mm in the  descending aorta. Findings are concerning for a long segment of intramural hematoma. Circumferential hematoma begins in the proximal transverse arch and extends to the distal descending arch above the diaphragm. There is a potential penetrating ulcer several cm above the diaphragm  along the medial aspect of the aorta (10 o'clock) image 56/7). This potential penetrating ulcer measures 1 cm x 0.6 cm. There is no aneurysm in the ascending, transverse descending thoracic aorta. No dissection flap identified. Great vessels are normal without evidence of dissection or aneurysm. No occlusion. No pericardial effusion. Coronary arteries grossly normal. No pulmonary embolism. Mediastinum/Nodes: No axillary or supraclavicular adenopathy. No mediastinal hilar adenopathy. Lungs/Pleura: Mild basilar atelectasis. No pulmonary infarction. Pneumonia Musculoskeletal: No aggressive osseous lesion. Review of the MIP images confirms the above findings. CTA ABDOMEN AND PELVIS FINDINGS VASCULAR Aorta: Abdominal aorta is normal caliber. Celiac: Widely patent.  Some calcification at the ostia SMA: Widely patent.  Mild calcification at the ostia. Renals: Widely patent single renal arteries. Small/3 renal artery on the RIGHT. Kidneys enhance symmetrically IMA: Patent Inflow: Normal Veins: Normal Review of the MIP images confirms the above findings. NON-VASCULAR Hepatobiliary: No focal hepatic lesion. No biliary duct dilatation. Gallbladder is normal. Common bile duct is normal. Pancreas: Pancreas is normal. No ductal dilatation. No pancreatic inflammation. Spleen: Normal spleen Adrenals/urinary tract: Adrenal glands and kidneys are normal. The ureters and bladder normal. Stomach/Bowel: Small hiatal hernia. Stomach and small bowel normal. Appendix normal. Diverticula sigmoid colon without acute inflammation. Rectum normal. /Lymphatic: No lymphadenopathy. Reproductive: Uterus and ovaries normal Other: No free fluid. Musculoskeletal: No  aggressive osseous lesion. Review of the MIP images confirms the above findings. IMPRESSION: Chest Impression: 1. Long segment of circumferential aortic wall thickening extending from the proximal transverse arch to the distal descending thoracic aorta. Differential includes INTRAMURAL HEMATOMA versus thrombosed TYPE B AORTIC DISSECTION. Favor INTRAMURAL HEMATOMA as a potential PENETRATING ULCER found along the descending thoracic aorta. 1. Brachycephalic vessels are normal.  Aortic root is normal. Abdomen / Pelvis Impression: 1. Abdominal aorta and its branches are normal. No evidence of dissection in the abdominal aorta. 2.  Aortic Atherosclerosis (ICD10-I70.0). Findings conveyed toSTEPHEN KOHUT on 10/07/2019  at11:18. Electronically Signed   By: Suzy Bouchard M.D.   On: 10/07/2019 11:20   US Abdomen Limited Ruq  Result Date: 10/13/2019 CLINICAL DATA:  Elevated LFTs.  History of hypertension. EXAM: ULTRASOUND ABDOMEN LIMITED RIGHT UPPER QUADRANT COMPARISON:  10/11/2019, 10/07/2019, and 01/25/2017 CT FINDINGS: Gallbladder: Gallbladder has a normal appearance. Gallbladder wall is 1.5 millimeters, within normal limits. Sludge is present within the gallbladder. No discrete stones or pericholecystic fluid. No sonographic Murphy sign. Common bile duct: Diameter: 1.1 centimeters.  No choledocholithiasis. Liver: No focal lesion identified. Within normal limits in parenchymal echogenicity. Portal vein is patent on color Doppler imaging with normal direction of blood flow towards the liver. Other: Note is made of trace RIGHT pleural effusion. Parapelvic cysts identified within the RIGHT kidney. IMPRESSION: 1. Gallbladder sludge without evidence for acute cholecystitis. 2. Mild dilatation of the common bile duct without obvious choledocholithiasis. Electronically Signed   By: Nolon Nations M.D.   On: 10/13/2019 09:52     Subjective: No major events overnight of this morning.  No complaints.  Denies chest pain,  dyspnea, nausea, vomiting, abdominal pain or UTI symptoms.  Eager and excited to go home.   Discharge Exam: Vitals:   10/14/19 0421 10/14/19 0514  BP: (!) 125/55 (!) 141/77  Pulse:  81  Resp:    Temp:  98.3 F (36.8 C)  SpO2:  95%    GENERAL: No acute distress.  Appears well.  HEENT: MMM.  Vision and hearing grossly intact.  NECK: Supple.  No JVD.  LUNGS:  No IWOB. Good air movement bilaterally.  HEART:  RR. Heart sounds normal.  DP and radial pulses 2+ bilaterally ABD: Bowel sounds present. Soft. Non tender.  MSK/EXT:  Moves all extremities. No apparent deformity. No edema bilaterally. SKIN: no apparent skin lesion or wound NEURO: Awake, alert and oriented appropriately.  No gross deficit.  PSYCH: Calm. Normal affect.   The results of significant diagnostics from this hospitalization (including imaging, microbiology, ancillary and laboratory) are listed below for reference.     Microbiology: Recent Results (from the past 240 hour(s))  SARS Coronavirus 2 by RT PCR (hospital order, performed in [facility] hospital lab) Nasopharyngeal Nasopharyngeal Swab     Status: None   Collection Time: 10/07/19  3:30 PM   Specimen: Nasopharyngeal Swab  Result Value Ref Range Status   SARS Coronavirus 2 NEGATIVE NEGATIVE Final    Comment: (NOTE) If result is NEGATIVE SARS-CoV-2 target nucleic acids are NOT DETECTED. The SARS-CoV-2 RNA is generally detectable in upper and lower  respiratory specimens during the acute phase of infection. The lowest  concentration of SARS-CoV-2 viral copies this assay can detect is 250  copies / mL. A negative result does not preclude SARS-CoV-2 infection  and should not be used as the sole basis for treatment or other  patient management decisions.  A negative result may occur with  improper specimen collection / handling, submission of specimen other  than nasopharyngeal swab, presence of viral mutation(s) within the  areas targeted by this assay, and  inadequate number of viral copies  (<250 copies / mL). A negative result must be combined with clinical  observations, patient history, and epidemiological information. If result is POSITIVE SARS-CoV-2 target nucleic acids are DETECTED. The SARS-CoV-2 RNA is generally detectable in upper and lower  respiratory specimens dur ing the acute phase of infection.  Positive  results are indicative of active infection with SARS-CoV-2.  Clinical  correlation with patient history and other diagnostic information is  necessary to determine patient infection status.  Positive results do  not rule out bacterial infection or co-infection with other viruses. If result is PRESUMPTIVE POSTIVE SARS-CoV-2 nucleic acids MAY BE PRESENT.   A presumptive positive result was obtained on the submitted specimen  and confirmed on repeat testing.  While 2019 novel coronavirus  (SARS-CoV-2) nucleic acids may be present in the submitted sample  additional confirmatory testing may be necessary for epidemiological  and / or clinical management purposes  to differentiate between  SARS-CoV-2 and other Sarbecovirus currently known to infect humans.  If clinically indicated additional testing with an alternate test  methodology [phone]) is advised. The SARS-CoV-2 RNA is generally  detectable in upper and lower respiratory sp ecimens during the acute  phase of infection. The expected result is Negative. Fact Sheet for Patients:  StrictlyIdeas.no Fact Sheet for Healthcare Providers: BankingDealers.co.za This test is not yet approved or cleared by the [place] FDA and has been authorized for detection and/or diagnosis of SARS-CoV-2 by FDA under an Emergency Use Authorization (EUA).  This EUA will remain in effect (meaning this test can be used) for the duration of the COVID-19 declaration under Section 564(b)(1) of the Act, 21 U.S.C. section 360bbb-3(b)(1), unless the  authorization is terminated or revoked sooner. Performed at [facility], [place] [address], [place], [place] [address]   MRSA PCR Screening     Status: None   Collection Time: 10/07/19  5:45 PM   Specimen: Nasal Mucosa; Nasopharyngeal  Result Value Ref Range Status   MRSA by PCR NEGATIVE  NEGATIVE Final    Comment:        The GeneXpert MRSA Assay (FDA approved for NASAL specimens only), is one component of a comprehensive MRSA colonization surveillance program. It is not intended to diagnose MRSA infection nor to guide or monitor treatment for MRSA infections. Performed at [facility], [place] [address] [place], [place] [address]      Labs: BNP (last 3 results) No results for input(s): BNP in the last 8760 hours. Basic Metabolic Panel: Recent Labs  Lab 10/08/19 0213 10/11/19 0357 10/12/19 0356 10/13/19 0311 10/14/19 0321  NA 139 139 140 137 135  K 4.0 3.6 3.6 4.5 3.8  CL 105 106 108 104 105  CO2 24 22 23 22  21*  GLUCOSE 129* 94 107* 92 118*  BUN 17 18 13 13 13   CREATININE 0.88 0.82 0.87 0.74 0.84  CALCIUM 8.5* 8.2* 8.3* 8.0* 7.9*  MG 2.2 2.2  --   --  2.2  PHOS 3.8  --   --   --   --    Liver Function Tests: Recent Labs  Lab 10/11/19 0357 10/13/19 0311 10/13/19 0830 10/14/19 0321  AST 53* 136*  --  111*  ALT 52* 117*  --  139*  ALKPHOS 92 186*  --  212*  BILITOT 1.0 1.9* 1.3* 1.1  PROT 5.3* 5.2*  --  5.4*  ALBUMIN 2.5* 2.3*  --  2.3*   Recent Labs  Lab 10/07/19 0921  LIPASE 18   No results for input(s): AMMONIA in the last 168 hours. CBC: Recent Labs  Lab 10/08/19 0213 10/11/19 0357 10/12/19 0356 10/14/19 0321  WBC 13.7* 8.8 7.9 8.6  HGB 14.1 13.1 12.7 12.3  HCT 42.5 37.2 36.8 37.2  MCV 90.0 88.4 88.7 91.2  PLT 207 201 206 267   Cardiac Enzymes: No results for input(s): CKTOTAL, CKMB, CKMBINDEX, TROPONINI in the last 168 hours. BNP: Invalid input(s): POCBNP CBG: No results for input(s): GLUCAP in the last 168  hours. D-Dimer No results for input(s): DDIMER in the last 72 hours. Hgb A1c No results for input(s): HGBA1C in the last 72 hours. Lipid Profile No results for input(s): CHOL, HDL, LDLCALC, TRIG, CHOLHDL, LDLDIRECT in the last 72 hours. Thyroid function studies No results for input(s): TSH, T4TOTAL, T3FREE, THYROIDAB in the last 72 hours.  Invalid input(s): FREET3 Anemia work up No results for input(s): VITAMINB12, FOLATE, FERRITIN, TIBC, IRON, RETICCTPCT in the last 72 hours. Urinalysis    Component Value Date/Time   COLORURINE YELLOW 01/25/2017 0017   APPEARANCEUR CLEAR 01/25/2017 0017   LABSPEC 1.041 (H) 01/25/2017 0017   PHURINE 6.0 01/25/2017 0017   GLUCOSEU NEGATIVE 01/25/2017 0017   HGBUR MODERATE (A) 01/25/2017 0017   BILIRUBINUR NEGATIVE 01/25/2017 0017   KETONESUR 5 (A) 01/25/2017 0017   PROTEINUR NEGATIVE 01/25/2017 0017   UROBILINOGEN 0.2 10/06/2009 0001   NITRITE NEGATIVE 01/25/2017 0017   LEUKOCYTESUR TRACE (A) 01/25/2017 0017   Sepsis Labs Invalid input(s): PROCALCITONIN,  WBC,  LACTICIDVEN   Time coordinating discharge: 35 minutes  SIGNED:  Mercy Riding, MD  [facility] 10/14/2019, 8:14 AM  If 7PM-7AM, please contact night-coverage [email or web address] Password [email or web address]

## 2019-10-14 NOTE — Discharge Instructions (Addendum)
Heart-Healthy Eating Plan Heart-healthy meal planning includes:  Eating less unhealthy fats.  Eating more healthy fats.  Making other changes in your diet. Talk with your doctor or a diet specialist (dietitian) to create an eating plan that is right for you. What is my plan? Your doctor may recommend an eating plan that includes:  Total fat: ______% or less of total calories a day.  Saturated fat: ______% or less of total calories a day.  Cholesterol: less than _________mg a day. What are tips for following this plan? Cooking Avoid frying your food. Try to bake, boil, grill, or broil it instead. You can also reduce fat by:  Removing the skin from poultry.  Removing all visible fats from meats.  Steaming vegetables in water or broth. Meal planning   At meals, divide your plate into four equal parts: ? Fill one-half of your plate with vegetables and green salads. ? Fill one-fourth of your plate with whole grains. ? Fill one-fourth of your plate with lean protein foods.  Eat 4-5 servings of vegetables per day. A serving of vegetables is: ? 1 cup of raw or cooked vegetables. ? 2 cups of raw leafy greens.  Eat 4-5 servings of fruit per day. A serving of fruit is: ? 1 medium whole fruit. ?  cup of dried fruit. ?  cup of fresh, frozen, or canned fruit. ?  cup of 100% fruit juice.  Eat more foods that have soluble fiber. These are apples, broccoli, carrots, beans, peas, and barley. Try to get 20-30 g of fiber per day.  Eat 4-5 servings of nuts, legumes, and seeds per week: ? 1 serving of dried beans or legumes equals  cup after being cooked. ? 1 serving of nuts is  cup. ? 1 serving of seeds equals 1 tablespoon. General information  Eat more home-cooked food. Eat less restaurant, buffet, and fast food.  Limit or avoid alcohol.  Limit foods that are high in starch and sugar.  Avoid fried foods.  Lose weight if you are overweight.  Keep track of how much salt  (sodium) you eat. This is important if you have high blood pressure. Ask your doctor to tell you more about this.  Try to add vegetarian meals each week. Fats  Choose healthy fats. These include olive oil and canola oil, flaxseeds, walnuts, almonds, and seeds.  Eat more omega-3 fats. These include salmon, mackerel, sardines, tuna, flaxseed oil, and ground flaxseeds. Try to eat fish at least 2 times each week.  Check food labels. Avoid foods with trans fats or high amounts of saturated fat.  Limit saturated fats. ? These are often found in animal products, such as meats, butter, and cream. ? These are also found in plant foods, such as palm oil, palm kernel oil, and coconut oil.  Avoid foods with partially hydrogenated oils in them. These have trans fats. Examples are stick margarine, some tub margarines, cookies, crackers, and other baked goods. What foods can I eat? Fruits All fresh, canned (in natural juice), or frozen fruits. Vegetables Fresh or frozen vegetables (raw, steamed, roasted, or grilled). Green salads. Grains Most grains. Choose whole wheat and whole grains most of the time. Rice and pasta, including brown rice and pastas made with whole wheat. Meats and other proteins Lean, well-trimmed beef, veal, pork, and lamb. Chicken and [place] without skin. All fish and shellfish. Wild duck, rabbit, pheasant, and venison. Egg whites or low-cholesterol egg substitutes. Dried beans, peas, lentils, and tofu. Seeds and most  nuts. Dairy Low-fat or nonfat cheeses, including ricotta and mozzarella. Skim or 1% milk that is liquid, powdered, or evaporated. Buttermilk that is made with low-fat milk. Nonfat or low-fat yogurt. Fats and oils Non-hydrogenated (trans-free) margarines. Vegetable oils, including soybean, sesame, sunflower, olive, peanut, safflower, corn, canola, and cottonseed. Salad dressings or mayonnaise made with a vegetable oil. Beverages Mineral water. Coffee and tea. Diet  carbonated beverages. Sweets and desserts Sherbet, gelatin, and fruit ice. Small amounts of dark chocolate. Limit all sweets and desserts. Seasonings and condiments All seasonings and condiments. The items listed above may not be a complete list of foods and drinks you can eat. Contact a dietitian for more options. What foods should I avoid? Fruits Canned fruit in heavy syrup. Fruit in cream or butter sauce. Fried fruit. Limit coconut. Vegetables Vegetables cooked in cheese, cream, or butter sauce. Fried vegetables. Grains Breads that are made with saturated or trans fats, oils, or whole milk. Croissants. Sweet rolls. Donuts. High-fat crackers, such as cheese crackers. Meats and other proteins Fatty meats, such as hot dogs, ribs, sausage, bacon, rib-eye roast or steak. High-fat deli meats, such as salami and bologna. Caviar. Domestic duck and goose. Organ meats, such as liver. Dairy Cream, sour cream, cream cheese, and creamed cottage cheese. Whole-milk cheeses. Whole or 2% milk that is liquid, evaporated, or condensed. Whole buttermilk. Cream sauce or high-fat cheese sauce. Yogurt that is made from whole milk. Fats and oils Meat fat, or shortening. Cocoa butter, hydrogenated oils, palm oil, coconut oil, palm kernel oil. Solid fats and shortenings, including bacon fat, salt pork, lard, and butter. Nondairy cream substitutes. Salad dressings with cheese or sour cream. Beverages Regular sodas and juice drinks with added sugar. Sweets and desserts Frosting. Pudding. Cookies. Cakes. Pies. Milk chocolate or white chocolate. Buttered syrups. Full-fat ice cream or ice cream drinks. The items listed above may not be a complete list of foods and drinks to avoid. Contact a dietitian for more information. Summary  Heart-healthy meal planning includes eating less unhealthy fats, eating more healthy fats, and making other changes in your diet.  Eat a balanced diet. This includes fruits and  vegetables, low-fat or nonfat dairy, lean protein, nuts and legumes, whole grains, and heart-healthy oils and fats. This information is not intended to replace advice given to you by your health care provider. Make sure you discuss any questions you have with your health care provider. Document Released: 06/15/2012 Document Revised: 02/18/2018 Document Reviewed: 01/22/2018 Elsevier Patient Education  2020 [place]. Aortic Dissection  Aortic dissection happens when there is a tear in the wall of the body's main blood vessel (aorta). The aorta leads out of the heart (ascending aorta), curves around, and then goes down the chest (descending aorta) and into the abdomen to supply arteries with blood. The wall of the aorta has inner and outer layers. As blood collects along the tear, one part of the aorta continues to carry blood to the body, but blood can also flow into the tear between the layers of the aorta. The torn part of the aorta fills with blood and swells. This can reduce blood flow through the part of the aorta that is still supplying blood to the body. Aortic dissection is a medical emergency. What are the causes? This condition is commonly caused by weakening of the artery wall due to high blood pressure. Other causes may include:  An injury, such as from a car crash.  A complication from heart surgery or from a  diagnostic procedure called coronary catheterization.  Weakness of the artery wall due to birth defects that affect the connective tissues, such as Marfan syndrome. In some cases, the cause is not known. What increases the risk? The following factors may make you more likely to develop this condition:  Having certain medical conditions, such as: ? High blood pressure (hypertension). ? Hardening and narrowing of the arteries (atherosclerosis). ? A condition that causes inflammation of blood vessels, such as giant cell arteritis.  Having two cusps in the aortic valve  instead of three (bicuspid aortic valve).  Having a bulge in the wall of the aorta (aortic aneurysm).  Being female.  Being pregnant.  Being older than age 71.  Using cocaine.  Smoking.  Lifting heavy weights or doing other types of strength training (high-intensity resistance training). What are the signs or symptoms? Signs and symptoms of aortic dissection start suddenly. The most common symptoms are:  Severe chest pain that may feel like tearing, stabbing, or sharp pain.  Severe pain that spreads (radiates) to the back, neck, jaw, or abdomen.  Severe pain between the shoulder blades in the back. Other symptoms may include:  Trouble breathing.  Dizziness or fainting.  Sudden weakness on one side of the body.  Nausea or vomiting.  Trouble swallowing.  Coughing up blood.  Vomiting blood.  Clammy skin. How is this diagnosed? This condition may be diagnosed based on:  Your symptoms and a physical exam. This may include: ? Listening for abnormal blood flow sounds (murmurs) in your chest or abdomen. ? Checking your pulse in your arms and legs. ? Checking your blood pressure to see whether it is low, or whether there is a difference between the measurements (readings) from your right arm and left arm.  Electrocardiogram (ECG). This test measures the electrical activity in your heart.  Chest X-ray.  CT scan.  MRI.  Echocardiogram. This uses sound waves to make images of your heart.  Blood tests. How is this treated? It is important to treat aortic dissection as quickly as possible. Treatment may start as soon as your health care provider thinks that you have aortic dissection. Treatment depends on where the dissection is, how severe it is, and your overall health. Treatment may include:  Medicines to lower your heart rate and blood pressure.  Surgery to repair your aorta using artificial material (syntheticgraft).  A procedure to insert a stent-graft into the  aorta (endovascular procedure). During this procedure: 1. A long, thin tube (stent) is inserted into an artery near the groin (femoral artery). 2. The stent is moved up to the damaged part of the aorta. 3. The stent is opened to help improve blood flow and prevent future dissection. Your health care provider may refer you to a specialized treatment center. Follow these instructions at home: If you had surgery, follow instructions from your health care provider about home care after the procedure. Activity  Do not lift anything that is heavier than 10 lb (4.5 kg), or the limit that you are told, until your health care provider says that it is safe.  Avoid activities that could injure your chest or abdomen. Ask your health care provider what activities are safe for you.  After you have recovered, try to stay active. Ask your health care provider what activities are safe for you after recovery.  Enroll in cardiac rehabilitation. This is a program that helps to improve your health and well-being. It includes exercise training, education, and counseling to help  you recover. Lifestyle      Eat a heart-healthy diet, which includes lots of fresh fruits and vegetables, low-fat (lean) protein, and whole grains.  Work with your health care provider to treat any other conditions that you may have, such as obesity, high blood pressure, or diabetes.  Do not use any products that contain nicotine or tobacco, such as cigarettes, e-cigarettes, and chewing tobacco. If you need help quitting, ask your health care provider. General instructions  Take over-the-counter and prescription medicines only as told by your health care provider.  Talk with your health care provider about how to manage stress.  Keep all follow-up visits as told by your health care provider. This is important. Get help right away if you:  Develop any symptoms of aortic dissection after treatment, including severe pain in your  chest, back, or abdomen.  Have pain in your chest.  Have weakness in your arm or leg.  Have pain in your abdomen.  Have trouble breathing or you develop a cough.  Faint.  Develop a racing heartbeat (palpitations). These symptoms may represent a serious problem that is an emergency. Do not wait to see if the symptoms will go away. Get medical help right away. Call your local emergency services (911 in the [place] Do not drive yourself to the hospital. Summary  Aortic dissection happens when there is a tear in the wall of the body's main blood vessel (aorta). It is a medical emergency.  The most common symptom is severe pain in the chest or pain that spreads (radiates) to the back, neck, jaw, or abdomen.  It is important to treat aortic dissection as quickly as possible. Treatment usually includes medicines and surgery.  Take over-the-counter and prescription medicines only as told by your health care provider. This information is not intended to replace advice given to you by your health care provider. Make sure you discuss any questions you have with your health care provider. Document Released: 03/23/2008 Document Revised: 06/23/2018 Document Reviewed: 05/27/2018 Elsevier Patient Education  2020 [employer].

## 2019-10-20 DIAGNOSIS — I10 Essential (primary) hypertension: Secondary | ICD-10-CM | POA: Diagnosis not present

## 2019-10-20 DIAGNOSIS — I729 Aneurysm of unspecified site: Secondary | ICD-10-CM | POA: Diagnosis not present

## 2019-10-20 DIAGNOSIS — I7101 Dissection of thoracic aorta: Secondary | ICD-10-CM | POA: Diagnosis not present

## 2019-10-27 DIAGNOSIS — I10 Essential (primary) hypertension: Secondary | ICD-10-CM | POA: Diagnosis not present

## 2019-10-27 DIAGNOSIS — I7101 Dissection of thoracic aorta: Secondary | ICD-10-CM | POA: Diagnosis not present

## 2019-11-10 DIAGNOSIS — J45909 Unspecified asthma, uncomplicated: Secondary | ICD-10-CM | POA: Diagnosis not present

## 2019-11-10 DIAGNOSIS — J309 Allergic rhinitis, unspecified: Secondary | ICD-10-CM | POA: Diagnosis not present

## 2019-11-14 ENCOUNTER — Telehealth (HOSPITAL_COMMUNITY): Payer: Self-pay | Admitting: Licensed Clinical Social Worker

## 2019-11-14 ENCOUNTER — Ambulatory Visit (HOSPITAL_COMMUNITY): Payer: PPO | Admitting: Licensed Clinical Social Worker

## 2019-11-14 ENCOUNTER — Other Ambulatory Visit: Payer: Self-pay

## 2019-11-14 NOTE — Telephone Encounter (Signed)
Clinician contacted Melanie Nash about services and completing assessment. After discussing what what happening in her life and her interests in support, it was apparent that this was not what she was looking for. Clinician agreed to and contact info for [facility] and [facility] for caregiver support groups. Assessment was not completed. Clinician encouraged Melanie Nash to call back if she felt the need for individual therapy in the future.

## 2019-11-15 ENCOUNTER — Encounter (HOSPITAL_COMMUNITY): Payer: Self-pay | Admitting: Licensed Clinical Social Worker

## 2019-11-15 NOTE — Progress Notes (Signed)
Clinician discussed needs and noted interest in caregiver support groups. Clinician provided resources in the community ( [place] and [facility]) for groups.   CCA not completed, no charges filed.

## 2019-11-18 ENCOUNTER — Ambulatory Visit: Payer: PPO | Admitting: Gastroenterology

## 2019-11-28 ENCOUNTER — Other Ambulatory Visit: Payer: Self-pay | Admitting: Gastroenterology

## 2019-11-28 DIAGNOSIS — K219 Gastro-esophageal reflux disease without esophagitis: Secondary | ICD-10-CM

## 2019-11-28 DIAGNOSIS — K222 Esophageal obstruction: Secondary | ICD-10-CM

## 2019-11-30 ENCOUNTER — Other Ambulatory Visit: Payer: Self-pay | Admitting: Family Medicine

## 2019-11-30 DIAGNOSIS — Z1231 Encounter for screening mammogram for malignant neoplasm of breast: Secondary | ICD-10-CM

## 2019-12-12 DIAGNOSIS — I7101 Dissection of thoracic aorta: Secondary | ICD-10-CM | POA: Diagnosis not present

## 2019-12-12 DIAGNOSIS — J45909 Unspecified asthma, uncomplicated: Secondary | ICD-10-CM | POA: Diagnosis not present

## 2019-12-12 DIAGNOSIS — J309 Allergic rhinitis, unspecified: Secondary | ICD-10-CM | POA: Diagnosis not present

## 2020-01-01 ENCOUNTER — Ambulatory Visit (HOSPITAL_COMMUNITY)
Admission: EM | Admit: 2020-01-01 | Discharge: 2020-01-02 | Disposition: A | Discharge status: Home / Self Care | Payer: PPO | Attending: Emergency Medicine | Admitting: Emergency Medicine

## 2020-01-01 ENCOUNTER — Encounter (HOSPITAL_COMMUNITY): Payer: Self-pay

## 2020-01-01 DIAGNOSIS — T18128A Food in esophagus causing other injury, initial encounter: Secondary | ICD-10-CM | POA: Diagnosis not present

## 2020-01-01 DIAGNOSIS — K219 Gastro-esophageal reflux disease without esophagitis: Secondary | ICD-10-CM | POA: Diagnosis not present

## 2020-01-01 DIAGNOSIS — Z03818 Encounter for observation for suspected exposure to other biological agents ruled out: Secondary | ICD-10-CM | POA: Diagnosis not present

## 2020-01-01 DIAGNOSIS — Z20822 Contact with and (suspected) exposure to covid-19: Secondary | ICD-10-CM | POA: Diagnosis not present

## 2020-01-01 DIAGNOSIS — I071 Rheumatic tricuspid insufficiency: Secondary | ICD-10-CM | POA: Diagnosis not present

## 2020-01-01 DIAGNOSIS — M1990 Unspecified osteoarthritis, unspecified site: Secondary | ICD-10-CM | POA: Diagnosis not present

## 2020-01-01 DIAGNOSIS — T18108A Unspecified foreign body in esophagus causing other injury, initial encounter: Secondary | ICD-10-CM | POA: Diagnosis not present

## 2020-01-01 DIAGNOSIS — I10 Essential (primary) hypertension: Secondary | ICD-10-CM | POA: Diagnosis not present

## 2020-01-01 DIAGNOSIS — Z79899 Other long term (current) drug therapy: Secondary | ICD-10-CM | POA: Diagnosis not present

## 2020-01-01 DIAGNOSIS — M797 Fibromyalgia: Secondary | ICD-10-CM | POA: Diagnosis not present

## 2020-01-01 DIAGNOSIS — K449 Diaphragmatic hernia without obstruction or gangrene: Secondary | ICD-10-CM | POA: Insufficient documentation

## 2020-01-01 DIAGNOSIS — K222 Esophageal obstruction: Secondary | ICD-10-CM | POA: Diagnosis not present

## 2020-01-01 DIAGNOSIS — K5790 Diverticulosis of intestine, part unspecified, without perforation or abscess without bleeding: Secondary | ICD-10-CM | POA: Diagnosis not present

## 2020-01-01 DIAGNOSIS — X58XXXA Exposure to other specified factors, initial encounter: Secondary | ICD-10-CM | POA: Insufficient documentation

## 2020-01-01 NOTE — ED Triage Notes (Signed)
Pt states that she was taking her pills and got stuck in her throat, has had her esophagus stretched several times, no SOB, able to swallow some secretions

## 2020-01-02 ENCOUNTER — Encounter (HOSPITAL_COMMUNITY): Payer: Self-pay | Admitting: *Deleted

## 2020-01-02 ENCOUNTER — Emergency Department (HOSPITAL_COMMUNITY): Payer: PPO

## 2020-01-02 ENCOUNTER — Emergency Department (HOSPITAL_COMMUNITY): Payer: PPO | Admitting: Anesthesiology

## 2020-01-02 ENCOUNTER — Encounter (HOSPITAL_COMMUNITY)
Admission: EM | Disposition: A | Discharge status: Home / Self Care | Payer: Self-pay | Source: Home / Self Care | Attending: Emergency Medicine

## 2020-01-02 ENCOUNTER — Telehealth: Payer: Self-pay

## 2020-01-02 DIAGNOSIS — K5790 Diverticulosis of intestine, part unspecified, without perforation or abscess without bleeding: Secondary | ICD-10-CM | POA: Diagnosis not present

## 2020-01-02 DIAGNOSIS — T18128A Food in esophagus causing other injury, initial encounter: Secondary | ICD-10-CM | POA: Diagnosis not present

## 2020-01-02 DIAGNOSIS — K219 Gastro-esophageal reflux disease without esophagitis: Secondary | ICD-10-CM | POA: Diagnosis not present

## 2020-01-02 DIAGNOSIS — I10 Essential (primary) hypertension: Secondary | ICD-10-CM | POA: Diagnosis not present

## 2020-01-02 DIAGNOSIS — Z03818 Encounter for observation for suspected exposure to other biological agents ruled out: Secondary | ICD-10-CM | POA: Diagnosis not present

## 2020-01-02 DIAGNOSIS — Z79899 Other long term (current) drug therapy: Secondary | ICD-10-CM | POA: Diagnosis not present

## 2020-01-02 DIAGNOSIS — M1990 Unspecified osteoarthritis, unspecified site: Secondary | ICD-10-CM | POA: Diagnosis not present

## 2020-01-02 DIAGNOSIS — K449 Diaphragmatic hernia without obstruction or gangrene: Secondary | ICD-10-CM | POA: Diagnosis not present

## 2020-01-02 DIAGNOSIS — Z20822 Contact with and (suspected) exposure to covid-19: Secondary | ICD-10-CM | POA: Diagnosis not present

## 2020-01-02 DIAGNOSIS — T18108A Unspecified foreign body in esophagus causing other injury, initial encounter: Secondary | ICD-10-CM | POA: Diagnosis not present

## 2020-01-02 DIAGNOSIS — I071 Rheumatic tricuspid insufficiency: Secondary | ICD-10-CM | POA: Diagnosis not present

## 2020-01-02 DIAGNOSIS — M797 Fibromyalgia: Secondary | ICD-10-CM | POA: Diagnosis not present

## 2020-01-02 DIAGNOSIS — K222 Esophageal obstruction: Secondary | ICD-10-CM

## 2020-01-02 HISTORY — PX: ESOPHAGOGASTRODUODENOSCOPY: SHX5428

## 2020-01-02 HISTORY — PX: FOREIGN BODY REMOVAL: SHX962

## 2020-01-02 LAB — RESPIRATORY PANEL BY RT PCR (FLU A&B, COVID)
Influenza A by PCR: NEGATIVE
Influenza B by PCR: NEGATIVE
SARS Coronavirus 2 by RT PCR: NEGATIVE

## 2020-01-02 SURGERY — EGD (ESOPHAGOGASTRODUODENOSCOPY)
Anesthesia: Monitor Anesthesia Care | Laterality: Left

## 2020-01-02 MED ORDER — LACTATED RINGERS IV SOLN: INTRAVENOUS | Status: DC

## 2020-01-02 MED ORDER — PROPOFOL 500 MG/50ML IV EMUL
INTRAVENOUS | Status: DC | PRN
  Administered 2020-01-02: 100 ug/kg/min via INTRAVENOUS

## 2020-01-02 MED ORDER — SODIUM CHLORIDE 0.9 % IV SOLN: INTRAVENOUS | Status: DC

## 2020-01-02 MED ORDER — ONDANSETRON HCL 4 MG/2ML IJ SOLN
4.0000 mg | Freq: Once | INTRAMUSCULAR | Status: AC
  Administered 2020-01-02: 4 mg via INTRAVENOUS
  Filled 2020-01-02: qty 2

## 2020-01-02 MED ORDER — LABETALOL HCL 5 MG/ML IV SOLN
10.0000 mg | Freq: Once | INTRAVENOUS | Status: AC
  Administered 2020-01-02: 10 mg via INTRAVENOUS
  Filled 2020-01-02: qty 4

## 2020-01-02 NOTE — Op Note (Signed)
[facility] Patient Name: Melanie Nash Procedure Date : 01/02/2020 MRN: [id number] Attending MD: Carlota Raspberry. Havery Moros , MD Date of Birth: 1946/09/24 CSN: [id number] Age: 74 Admit Type: Inpatient Procedure:                Upper GI endoscopy Indications:              Foreign body in the esophagus - patient endorses                            pills would not pass, intolerance of secretions,                            history of benign GEJ stricture, s/p dilation in                            February 2020 Providers:                Carlota Raspberry. Havery Moros, MD, Ashley Jacobs, RN, Lina Sar, Technician Referring MD:              Medicines:                Monitored Anesthesia Care Complications:            No immediate complications. Estimated blood loss:                            Minimal. Estimated Blood Loss:     Estimated blood loss was minimal. Procedure:                Pre-Anesthesia Assessment:                           - Prior to the procedure, a History and Physical                            was performed, and patient medications and                            allergies were reviewed. The patient's tolerance of                            previous anesthesia was also reviewed. The risks                            and benefits of the procedure and the sedation                            options and risks were discussed with the patient.                            All questions were answered, and informed consent  was obtained. Prior Anticoagulants: The patient has                            taken no previous anticoagulant or antiplatelet                            agents. ASA Grade Assessment: II - A patient with                            mild systemic disease. After reviewing the risks                            and benefits, the patient was deemed in                            satisfactory condition to undergo the  procedure.                           After obtaining informed consent, the endoscope was                            passed under direct vision. Throughout the                            procedure, the patient's blood pressure, pulse, and                            oxygen saturations were monitored continuously. The                            [id number] Olympus gastroscope was                            introduced through the mouth, and advanced to the                            second part of duodenum. The upper GI endoscopy was                            accomplished without difficulty. The patient                            tolerated the procedure well. Scope In: Scope Out: Findings:      A large food bolus was found impacted at the gastroesophageal junction.       Removal of food was accomplished using gentle pressure to push it into       the stomach.      Esophagogastric landmarks were identified: the Z-line was found at 30       cm, the gastroesophageal junction was found at 30 cm and the upper       extent of the gastric folds was found at 35 cm from the incisors.      A 5 cm hiatal hernia was present.      One benign-appearing, intrinsic moderate stenosis was found.  This       stenosis measured less than one cm (in length) and was inflamed due to       the impaction with mucosal wrents also noted from passage of the       endoscope. Further dilation was not performed today.      The exam of the esophagus was otherwise normal.      The entire examined stomach was normal.      The duodenal bulb and second portion of the duodenum were normal. Impression:               - Food at the gastroesophageal junction. Removal                            was successful.                           - Esophagogastric landmarks identified.                           - 5 cm hiatal hernia.                           - Benign-appearing esophageal stenosis as above                           -  Normal stomach.                           - Normal duodenal bulb and second portion of the                            duodenum. Recommendation:           - Patient has a contact number available for                            emergencies. The signs and symptoms of potential                            delayed complications were discussed with the                            patient. Return to normal activities tomorrow.                            Written discharge instructions were provided to the                            patient.                           - Soft diet - no meat, hard fresh vegetables, etc                           - Continue present medications.                           -  Repeat upper endoscopy in 2 weeks for further                            dilation of GEJ stricture. Our office will contact                            you for scheduling. Procedure Code(s):        --- Professional ---                           [phone], Esophagogastroduodenoscopy, flexible,                            transoral; with removal of foreign body(s) Diagnosis Code(s):        --- Professional ---                           [id number], Food in esophagus causing other injury,                            initial encounter                           K44.9, Diaphragmatic hernia without obstruction or                            gangrene                           K22.2, Esophageal obstruction                           T18.108A, Unspecified foreign body in esophagus                            causing other injury, initial encounter CPT copyright 2019 American Medical Association. All rights reserved. The codes documented in this report are preliminary and upon coder review may  be revised to meet current compliance requirements. Remo Lipps P. Dimarco Minkin, MD 01/02/2020 12:10:42 PM This report has been signed electronically. Number of Addenda: 0

## 2020-01-02 NOTE — Interval H&P Note (Signed)
History and Physical Interval Note:  01/02/2020 11:45 AM  Melanie Nash  has presented today for surgery, with the diagnosis of Dysphagia, food impaction.  The various methods of treatment have been discussed with the patient and family. After consideration of risks, benefits and other options for treatment, the patient has consented to  Procedure(s): ESOPHAGOGASTRODUODENOSCOPY (EGD) (Left) as a surgical intervention.  The patient's history has been reviewed, patient examined, no change in status, stable for surgery.  I have reviewed the patient's chart and labs.  Questions were answered to the patient's satisfaction.     [place]

## 2020-01-02 NOTE — ED Notes (Addendum)
Pt was provided with water to drink and successfully was able to swallow 5-6 SIPS of water, per tech. Nt also states that pt has been "sticking her finger down her throat".

## 2020-01-02 NOTE — Anesthesia Postprocedure Evaluation (Signed)
Anesthesia Post Note  Patient: Melanie Nash  Procedure(s) Performed: ESOPHAGOGASTRODUODENOSCOPY (EGD) (Left ) FOREIGN BODY REMOVAL     Patient location during evaluation: Endoscopy Anesthesia Type: MAC Level of consciousness: awake and alert Pain management: pain level controlled Vital Signs Assessment: post-procedure vital signs reviewed and stable Respiratory status: spontaneous breathing, nonlabored ventilation, respiratory function stable and patient connected to nasal cannula oxygen Cardiovascular status: stable and blood pressure returned to baseline Postop Assessment: no apparent nausea or vomiting Anesthetic complications: no    Last Vitals:  Vitals:   01/02/20 1210 01/02/20 1220  BP: (!) 171/95 (!) 165/94  Pulse: 78 65  Resp: 18 18  Temp:    SpO2: 97% 97%    Last Pain:  Vitals:   01/02/20 1200  TempSrc: Oral  PainSc: 0-No pain                 Catalina Gravel

## 2020-01-02 NOTE — Discharge Instructions (Signed)
YOU HAD AN ENDOSCOPIC PROCEDURE TODAY: Refer to the procedure report and other information in the discharge instructions given to you for any specific questions about what was found during the examination. If this information does not answer your questions, please call [place] office at [phone] to clarify.   YOU SHOULD EXPECT: Some feelings of bloating in the abdomen. Passage of more gas than usual. Walking can help get rid of the air that was put into your GI tract during the procedure and reduce the bloating. If you had a lower endoscopy (such as a colonoscopy or flexible sigmoidoscopy) you may notice spotting of blood in your stool or on the toilet paper. Some abdominal soreness may be present for a day or two, also.  DIET: Your first meal following the procedure should be a light meal and then it is ok to progress to your normal diet. A half-sandwich or bowl of soup is an example of a good first meal. Heavy or fried foods are harder to digest and may make you feel nauseous or bloated. Drink plenty of fluids but you should avoid alcoholic beverages for 24 hours. If you had a esophageal dilation, please see attached instructions for diet.    ACTIVITY: Your care partner should take you home directly after the procedure. You should plan to take it easy, moving slowly for the rest of the day. You can resume normal activity the day after the procedure however YOU SHOULD NOT DRIVE, use power tools, machinery or perform tasks that involve climbing or major physical exertion for 24 hours (because of the sedation medicines used during the test).   SYMPTOMS TO REPORT IMMEDIATELY: A gastroenterologist can be reached at any hour. Please call [phone]  for any of the following symptoms:   Following upper endoscopy (EGD, EUS, ERCP, esophageal dilation) Vomiting of blood or coffee ground material  New, significant abdominal pain  New, significant chest pain or pain under the shoulder blades  Painful or  persistently difficult swallowing  New shortness of breath  Black, tarry-looking or red, bloody stools  FOLLOW UP:  If any biopsies were taken you will be contacted by phone or by letter within the next 1-3 weeks. Call [phone]  if you have not heard about the biopsies in 3 weeks.  Please also call with any specific questions about appointments or follow up tests.  

## 2020-01-02 NOTE — Telephone Encounter (Signed)
-----   Message from Yetta Flock, MD sent at 01/02/2020 12:12 PM EST ----- Regarding: follow up EGD Ison Wichmann can you please coordinate a follow up EGD for this patient with Dr. Ardis Hughs at the [facility] in 2 weeks or so. Seen in the ED today for food impaction which I treated. She needs a follow up dilation. Thanks

## 2020-01-02 NOTE — Anesthesia Preprocedure Evaluation (Signed)
Anesthesia Evaluation  Patient identified by MRN, date of birth, ID band Patient awake    Reviewed: Allergy & Precautions, NPO status , Patient's Chart, lab work & pertinent test results, reviewed documented beta blocker date and time   Airway Mallampati: II  TM Distance: >3 FB Neck ROM: Full    Dental  (+) Teeth Intact, Dental Advisory Given   Pulmonary neg pulmonary ROS,    Pulmonary exam normal breath sounds clear to auscultation       Cardiovascular hypertension, Pt. 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 Left ventricular ejection fraction, by visual estimation, is 60 to 65%. The left ventricle has normal function. Normal left ventricular size. There is mildly increased left ventricular hypertrophy.  2. Left ventricular diastolic Doppler parameters are consistent with impaired relaxation pattern of LV diastolic filling.  3. Global right ventricle has normal systolic function.The right ventricular size is normal. No increase in right ventricular wall thickness.  4. Left atrial size was normal.  5. Right atrial size was normal.  6. The mitral valve is normal in structure. No evidence of mitral valve regurgitation. No evidence of mitral stenosis.  7. The tricuspid valve is normal in structure. Tricuspid valve regurgitation is mild.  8. The aortic valve is normal in structure. Aortic valve regurgitation was not visualized by color flow Doppler. Structurally normal aortic valve, with no evidence of sclerosis or stenosis.  9. The pulmonic valve was normal in structure. Pulmonic valve regurgitation is not visualized by color flow Doppler. 10. Normal pulmonary artery systolic pressure. 11. The inferior vena cava is normal in  size with greater than 50% respiratory variability, suggesting right atrial pressure of 3 mmHg.   Neuro/Psych negative neurological ROS  negative psych ROS   GI/Hepatic Neg liver ROS, hiatal hernia, GERD  ,Dysphagia, food impaction Esophageal stricture   Endo/Other  negative endocrine ROS  Renal/GU negative Renal ROS     Musculoskeletal  (+) Arthritis , Fibromyalgia -  Abdominal   Peds  Hematology negative hematology ROS (+)   Anesthesia Other Findings Day of surgery medications reviewed with the patient.  Reproductive/Obstetrics                             Anesthesia Physical Anesthesia Plan  ASA: III  Anesthesia Plan: MAC   Post-op Pain Management:    Induction: Intravenous  PONV Risk Score and Plan: 2 and Propofol infusion and Treatment may vary due to age or medical condition  Airway Management Planned: Nasal Cannula  Additional Equipment:   Intra-op Plan:   Post-operative Plan:   Informed Consent: I have reviewed the patients History and Physical, chart, labs and discussed the procedure including the risks, benefits and alternatives for the proposed anesthesia with the patient or authorized representative who has indicated his/her understanding and acceptance.     Dental advisory given  Plan Discussed with: CRNA and Anesthesiologist  Anesthesia Plan Comments:         Anesthesia Quick Evaluation

## 2020-01-02 NOTE — Transfer of Care (Signed)
Immediate Anesthesia Transfer of Care Note  Patient: Melanie Nash  Procedure(s) Performed: ESOPHAGOGASTRODUODENOSCOPY (EGD) (Left ) FOREIGN BODY REMOVAL  Patient Location: Endoscopy Unit  Anesthesia Type:MAC  Level of Consciousness: awake, alert  and oriented  Airway & Oxygen Therapy: Patient Spontanous Breathing  Post-op Assessment: Report given to RN and Post -op Vital signs reviewed and stable  Post vital signs: Reviewed and stable  Last Vitals:  Vitals Value Taken Time  BP 167/98 01/02/20 1200  Temp 36.6 C 01/02/20 1200  Pulse 86 01/02/20 1202  Resp 18 01/02/20 1202  SpO2 97 % 01/02/20 1202  Vitals shown include unvalidated device data.  Last Pain:  Vitals:   01/02/20 1200  TempSrc: Oral  PainSc: 0-No pain         Complications: No apparent anesthesia complications

## 2020-01-02 NOTE — Telephone Encounter (Signed)
Spoke with the pt and she just got home from the hospital and prefers to call me back tomorrow

## 2020-01-02 NOTE — ED Provider Notes (Signed)
[facility] EMERGENCY DEPARTMENT Provider Note   CSN: [id number] Arrival date & time: 01/01/20  2212     History Chief Complaint  Patient presents with  . Foreign Body    Melanie Nash is a 74 y.o. female.  The history is provided by the patient.  Patient presents for difficulty swallowing.  She reports she swallowed her evening medicines and immediately felt they got stuck in her throat.  Since that time she is unable to swallow water.  She reports throwing up frequently.  She has pain with swallowing.  No chest pain or shortness of breath.  No other acute complaints.  Her course is worsening, nothing improves her symptoms   Patient has history of thoracic aortic dissection from October, does not have any issues since then, denies any chest pain    Past Medical History:  Diagnosis Date  . Allergy   . Arthritis   . Diverticulosis   . Esophageal stricture   . Fibromyalgia   . Fibromyalgia   . GERD (gastroesophageal reflux disease)   . Hiatal hernia   . Hypertension    pt not taking prescribed bp med  . Seasonal allergies    sinus infection    Patient Active Problem List   Diagnosis Date Noted  . LFT elevation   . Aortic dissection ([place]) 10/07/2019  . Diverticulitis 01/25/2017  . Abnormal liver function 01/25/2017  . Adnexal mass 01/25/2017  . Diverticulitis of large intestine with perforation without bleeding     Past Surgical History:  Procedure Laterality Date  . COLONOSCOPY    . ESOPHAGOGASTRODUODENOSCOPY     dysphagia  . ESOPHAGOGASTRODUODENOSCOPY N/A 02/26/2016   Procedure: ESOPHAGOGASTRODUODENOSCOPY (EGD);  Surgeon: Milus Banister, MD;  Location: [place];  Service: Endoscopy;  Laterality: N/A;     OB History   No obstetric history on file.     Family History  Problem Relation Age of Onset  . Heart disease Mother   . Diabetes Sister   . Diabetes Maternal Uncle   . AAA (abdominal aortic aneurysm) Father   . Colon cancer Neg  Hx   . Stomach cancer Neg Hx     Social History   Tobacco Use  . Smoking status: Never Smoker  . Smokeless tobacco: Never Used  Substance Use Topics  . Alcohol use: No    Alcohol/week: 0.0 standard drinks  . Drug use: No    Home Medications Prior to Admission medications   Medication Sig Start Date End Date Taking? Authorizing Provider  carvedilol (COREG) 12.5 MG tablet Take 1 tablet (12.5 mg total) by mouth 2 (two) times daily with a meal. 10/14/19   Mercy Riding, MD  omeprazole (PRILOSEC) 20 MG capsule TAKE 1 CAPSULE BY MOUTH DAILY AT BEDTIME 11/28/19   Milus Banister, MD    Allergies    Patient has no known allergies.  Review of Systems   Review of Systems  Constitutional: Negative for fever.  HENT: Positive for trouble swallowing.   Respiratory: Negative for shortness of breath.   Cardiovascular: Negative for chest pain.  Gastrointestinal: Positive for nausea and vomiting.  All other systems reviewed and are negative.   Physical Exam Updated Vital Signs BP (!) 168/103 (BP Location: Right Arm)   Pulse 67   Temp (!) 97.5 F (36.4 C) (Oral)   Resp 16   SpO2 97%   Physical Exam CONSTITUTIONAL: Well developed/well nourished HEAD: Normocephalic/atraumatic EYES: EOMI ENMT: Mucous membranes moist, uvula midline without erythema,  no drooling, no stridor NECK: supple no meningeal signs, no neck mass, no crepitus SPINE/BACK:entire spine nontender CV: S1/S2 noted LUNGS: Lungs are clear to auscultation bilaterally, no apparent distress ABDOMEN: soft, nontender NEURO: Pt is awake/alert/appropriate, moves all extremitiesx4.  No facial droop.   EXTREMITIES:  full ROM, pulses equal x4 SKIN: warm, color normal PSYCH: no abnormalities of mood noted, alert and oriented to situation  ED Results / Procedures / Treatments   Labs (all labs ordered are listed, but only abnormal results are displayed) Labs Reviewed  RESPIRATORY PANEL BY RT PCR (FLU A&B, COVID)     EKG None  Radiology DG Neck Soft Tissue  Result Date: 01/02/2020 CLINICAL DATA:  Pill got stuck in throat EXAM: NECK SOFT TISSUES - 1+ VIEW COMPARISON:  None. FINDINGS: No radiopaque foreign body is visualized. Epiglottis appears normal. Prevertebral soft tissue thickness is normal. Subglottic trachea is patent. Degenerative changes of the cervical spine IMPRESSION: No radiopaque foreign body identified Electronically Signed   By: Donavan Foil M.D.   On: 01/02/2020 03:58    Procedures Procedures  Medications Ordered in ED Medications  labetalol (NORMODYNE) injection 10 mg (has no administration in time range)  ondansetron (ZOFRAN) injection 4 mg (has no administration in time range)    ED Course  I have reviewed the triage vital signs and the nursing notes.  Pertinent  imaging results that were available during my care of the patient were reviewed by me and considered in my medical decision making (see chart for details).    MDM Rules/Calculators/A&P                      6:44 AM Patient presents with difficulty swallowing.  She has a history of known esophageal stenosis and has required esophageal dilatation in the past.  She swallowed her pills prior to arrival and was having difficulty swallowing.  When I entered the room to examine patient she was already vomiting up water that she had just drank.  She reports that it feels that liquids are pooling in her throat and then she will vomit them up.  She admits to at times forcefully trying to vomit.  I discussed the case with on-call with [facility] gastroenterology Dr. Bryan Lemma.  Plan will be to prep the patient for endoscopy, send rapid Covid test.  At this time I will give her a dose of labetalol to help her blood pressure as she was likely unable to take all of her medicines.  Also give her a dose of Zofran. Final Clinical Impression(s) / ED Diagnoses Final diagnoses:  Esophageal stenosis    Rx / DC Orders ED Discharge  Orders    None       Ripley Fraise, MD 01/02/20 [phone]

## 2020-01-02 NOTE — H&P (View-Only) (Signed)
HPI :  74 y/o female with a history of dysphagia secondary to distal esophageal stricture, history of GERD on chronic prilosec, who came to the hospital overnight for difficulty swallowing.   Her last meal was 4PM yesterday, tolerated it well. In general she has not had much problems with eating lately. Her last EGD was 02/09/19 at which time she had the distal Shatski ring dilated to 15.9mm with good result, she states she has had good benefit for her symptoms since that time, only has rare dysphagia.  Last night she took her medications, states she felt them got "stuck" and has not been able to swallow anything since then. She points to her throat where she thinks it is stuck. She has failed trials of swallowing water, has to spit this up and her secretions. She denies any discomfort at all in her chest. No SOB or dyspnea. She had a negative COVID test in the ED.   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 She was admitted for this in October. She had some elevated ALT/AST into the low 100s during that time and unconjugated hyperbilirubinemia. MRCP did show show any biliary obstruction, no choledocholithiasis. CBD was 42mm with mild diffuse biliary ductal dilation. She has not had follow up since that time.     Past Medical History:  Diagnosis Date  . Allergy   . Arthritis   . Diverticulosis   . Esophageal stricture   . Fibromyalgia   . Fibromyalgia   . GERD (gastroesophageal reflux disease)   . Hiatal hernia   . Hypertension    pt not taking prescribed bp med  . Seasonal allergies    sinus infection     Past Surgical History:  Procedure Laterality Date  . COLONOSCOPY    . ESOPHAGOGASTRODUODENOSCOPY     dysphagia  . ESOPHAGOGASTRODUODENOSCOPY N/A 02/26/2016   Procedure: ESOPHAGOGASTRODUODENOSCOPY (EGD);  Surgeon: Milus Banister, MD;  Location: [place];  Service: Endoscopy;  Laterality: N/A;   Family History  Problem  Relation Age of Onset  . Heart disease Mother   . Diabetes Sister   . Diabetes Maternal Uncle   . AAA (abdominal aortic aneurysm) Father   . Colon cancer Neg Hx   . Stomach cancer Neg Hx    Social History   Tobacco Use  . Smoking status: Never Smoker  . Smokeless tobacco: Never Used  Substance Use Topics  . Alcohol use: No    Alcohol/week: 0.0 standard drinks  . Drug use: No   No current facility-administered medications for this encounter.   Current Outpatient Medications  Medication Sig Dispense Refill  . carvedilol (COREG) 12.5 MG tablet Take 1 tablet (12.5 mg total) by mouth 2 (two) times daily with a meal. 180 tablet 1  . omeprazole (PRILOSEC) 20 MG capsule TAKE 1 CAPSULE BY MOUTH DAILY AT BEDTIME 30 capsule 0   No Known Allergies   Review of Systems: All systems reviewed and negative except where noted in HPI.    DG Neck Soft Tissue  Result Date: 01/02/2020 CLINICAL DATA:  Pill got stuck in throat EXAM: NECK SOFT TISSUES - 1+ VIEW COMPARISON:  None. FINDINGS: No radiopaque foreign body is visualized. Epiglottis appears normal. Prevertebral soft tissue thickness is normal. Subglottic trachea is patent. Degenerative changes of the cervical spine IMPRESSION: No radiopaque foreign body identified Electronically Signed   By: Donavan Foil M.D.   On: 01/02/2020 03:58    Physical Exam: BP Marland Kitchen)  178/97   Pulse 60   Temp (!) 97.5 F (36.4 C) (Oral)   Resp 18   SpO2 94%  Constitutional: Pleasant,well-developed, female in no acute distress. HEENT: Normocephalic and atraumatic. Conjunctivae are normal. No scleral icterus. Neck supple.  Cardiovascular: Normal rate, regular rhythm.  Pulmonary/chest: Effort normal and breath sounds normal. No wheezing, rales or rhonchi. Abdominal: Soft, nondistended, nontender. There are no masses palpable. No hepatomegaly. Extremities: no edema Lymphadenopathy: No cervical adenopathy noted. Neurological: Alert and oriented to person place and  time. Skin: Skin is warm and dry. No rashes noted. Psychiatric: Normal mood and affect. Behavior is normal.   ASSESSMENT AND PLAN: 74 y/o female here for ED visit due to inability to swallow liquids after taking her pills last night, concerning for impaction / obstruction. I discussed the situation with her. Given persistent symptoms and inability to tolerate PO, urgent EGD is recommended to relieve the obstruction and dilate the known esophageal stricture if warranted. I discussed EGD and sedation with her, risks / benefits of each. She is COVID negative. She wished to proceed. Will plan on EGD this AM. Further recommendations pending that result.  She should otherwise have LFTs drawn to follow up prior abnormality and can have outpatient follow up for this.   [place] Cellar, MD [facility]

## 2020-01-02 NOTE — Consult Note (Signed)
HPI :  74 y/o female with a history of dysphagia secondary to distal esophageal stricture, history of GERD on chronic prilosec, who came to the hospital overnight for difficulty swallowing.   Her last meal was 4PM yesterday, tolerated it well. In general she has not had much problems with eating lately. Her last EGD was 02/09/19 at which time she had the distal Shatski ring dilated to 15.80mm with good result, she states she has had good benefit for her symptoms since that time, only has rare dysphagia.  Last night she took her medications, states she felt them got "stuck" and has not been able to swallow anything since then. She points to her throat where she thinks it is stuck. She has failed trials of swallowing water, has to spit this up and her secretions. She denies any discomfort at all in her chest. No SOB or dyspnea. She had a negative COVID test in the ED.   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 She was admitted for this in October. She had some elevated ALT/AST into the low 100s during that time and unconjugated hyperbilirubinemia. MRCP did show show any biliary obstruction, no choledocholithiasis. CBD was 66mm with mild diffuse biliary ductal dilation. She has not had follow up since that time.     Past Medical History:  Diagnosis Date  . Allergy   . Arthritis   . Diverticulosis   . Esophageal stricture   . Fibromyalgia   . Fibromyalgia   . GERD (gastroesophageal reflux disease)   . Hiatal hernia   . Hypertension    pt not taking prescribed bp med  . Seasonal allergies    sinus infection     Past Surgical History:  Procedure Laterality Date  . COLONOSCOPY    . ESOPHAGOGASTRODUODENOSCOPY     dysphagia  . ESOPHAGOGASTRODUODENOSCOPY N/A 02/26/2016   Procedure: ESOPHAGOGASTRODUODENOSCOPY (EGD);  Surgeon: Milus Banister, MD;  Location: [place];  Service: Endoscopy;  Laterality: N/A;   Family History  Problem  Relation Age of Onset  . Heart disease Mother   . Diabetes Sister   . Diabetes Maternal Uncle   . AAA (abdominal aortic aneurysm) Father   . Colon cancer Neg Hx   . Stomach cancer Neg Hx    Social History   Tobacco Use  . Smoking status: Never Smoker  . Smokeless tobacco: Never Used  Substance Use Topics  . Alcohol use: No    Alcohol/week: 0.0 standard drinks  . Drug use: No   No current facility-administered medications for this encounter.   Current Outpatient Medications  Medication Sig Dispense Refill  . carvedilol (COREG) 12.5 MG tablet Take 1 tablet (12.5 mg total) by mouth 2 (two) times daily with a meal. 180 tablet 1  . omeprazole (PRILOSEC) 20 MG capsule TAKE 1 CAPSULE BY MOUTH DAILY AT BEDTIME 30 capsule 0   No Known Allergies   Review of Systems: All systems reviewed and negative except where noted in HPI.    DG Neck Soft Tissue  Result Date: 01/02/2020 CLINICAL DATA:  Pill got stuck in throat EXAM: NECK SOFT TISSUES - 1+ VIEW COMPARISON:  None. FINDINGS: No radiopaque foreign body is visualized. Epiglottis appears normal. Prevertebral soft tissue thickness is normal. Subglottic trachea is patent. Degenerative changes of the cervical spine IMPRESSION: No radiopaque foreign body identified Electronically Signed   By: Donavan Foil M.D.   On: 01/02/2020 03:58    Physical Exam: BP Marland Kitchen)  178/97   Pulse 60   Temp (!) 97.5 F (36.4 C) (Oral)   Resp 18   SpO2 94%  Constitutional: Pleasant,well-developed, female in no acute distress. HEENT: Normocephalic and atraumatic. Conjunctivae are normal. No scleral icterus. Neck supple.  Cardiovascular: Normal rate, regular rhythm.  Pulmonary/chest: Effort normal and breath sounds normal. No wheezing, rales or rhonchi. Abdominal: Soft, nondistended, nontender. There are no masses palpable. No hepatomegaly. Extremities: no edema Lymphadenopathy: No cervical adenopathy noted. Neurological: Alert and oriented to person place and  time. Skin: Skin is warm and dry. No rashes noted. Psychiatric: Normal mood and affect. Behavior is normal.   ASSESSMENT AND PLAN: 74 y/o female here for ED visit due to inability to swallow liquids after taking her pills last night, concerning for impaction / obstruction. I discussed the situation with her. Given persistent symptoms and inability to tolerate PO, urgent EGD is recommended to relieve the obstruction and dilate the known esophageal stricture if warranted. I discussed EGD and sedation with her, risks / benefits of each. She is COVID negative. She wished to proceed. Will plan on EGD this AM. Further recommendations pending that result.  She should otherwise have LFTs drawn to follow up prior abnormality and can have outpatient follow up for this.   [place] Cellar, MD [facility]

## 2020-01-03 ENCOUNTER — Other Ambulatory Visit: Payer: Self-pay

## 2020-01-03 DIAGNOSIS — Z1159 Encounter for screening for other viral diseases: Secondary | ICD-10-CM

## 2020-01-03 NOTE — Telephone Encounter (Signed)
The pt has been scheduled for COVID screen, pre visit and EGD with Dr Ardis Hughs.  The pt has been advised of the information and verbalized understanding.

## 2020-01-04 ENCOUNTER — Other Ambulatory Visit: Payer: Self-pay | Admitting: Gastroenterology

## 2020-01-04 DIAGNOSIS — K219 Gastro-esophageal reflux disease without esophagitis: Secondary | ICD-10-CM

## 2020-01-04 DIAGNOSIS — K222 Esophageal obstruction: Secondary | ICD-10-CM

## 2020-01-05 ENCOUNTER — Other Ambulatory Visit: Payer: Self-pay

## 2020-01-05 DIAGNOSIS — I7100 Dissection of unspecified site of aorta: Secondary | ICD-10-CM

## 2020-01-12 ENCOUNTER — Ambulatory Visit: Payer: PPO | Admitting: Vascular Surgery

## 2020-01-13 ENCOUNTER — Encounter: Payer: Self-pay | Admitting: Gastroenterology

## 2020-01-13 ENCOUNTER — Telehealth: Payer: Self-pay | Admitting: *Deleted

## 2020-01-13 ENCOUNTER — Other Ambulatory Visit: Payer: Self-pay

## 2020-01-13 ENCOUNTER — Ambulatory Visit: Payer: Self-pay | Admitting: *Deleted

## 2020-01-13 DIAGNOSIS — K222 Esophageal obstruction: Secondary | ICD-10-CM

## 2020-01-13 NOTE — Telephone Encounter (Signed)
Dr Ardis Hughs,  Mrs Barboza was a virtual visit this morning for an EGD  Wednesday 01-18-2020 due to esophageal stricture- she had EGD 01-02-2020 in the hospital for food impaction.  Pt informed me  During our Virtual Visit that she was exposed to Covid yesterday 01-12-2020 by her son- he was diagnosed yesterday and admitted to [facility] - she and her husband took him to the hospital-  Son lives with pt and husband- pt states she has no s/s  At this point -  But she thinks she needs to RS her EGD-  I told her due to her exposure she will most likely need to RS and quarantine x 10-14 days to see if she becomes symptomatic.  Please advise,     Marijean Niemann

## 2020-01-13 NOTE — Telephone Encounter (Signed)
Pt instructed we need to Cx and RS her EGD per Dr Ardis HughsHanley Seamen pt instructions per MD- Canceled her 1-20 EGD- RS her to 2-24 at 10 am EGD for esophageal stricture and PV 2-10 at 11 am Virtual- Pt instructed to quarantine until 1-24-1-28 per Dr Ardis Hughs-

## 2020-01-13 NOTE — Telephone Encounter (Signed)
I agree.  She needs to quarantine at least until the 24th, possibly until the 28th.  We need to reschedule her upper endoscopy until that window has passed.  Lets plan on mid-to-late February or so.  Thank you

## 2020-01-13 NOTE — Progress Notes (Signed)
  Pt informed me she was exposed to Covid yesterday 01-12-2020 by her son- he was diagnosed yesterday and admitted to [facility] - she and her husband took him to the hospital-  Son lives with pt and husband- pt states she has no s/s  At this point - I explained to her since he lives with her, she will need to quarantine x 10-14 days and see if hse becomes symptomatic- TE to Dr Ardis Hughs - PV not completed-    Per Dr Ardis Hughs, cancel 1-20 EGD- RS to mid to late FEB-  RS to 2-24 at 10 am and PV 2-10 virtual at 11 am - pt instructed per Dr Ardis Hughs to quarantine x 10-14 days or longer if s/s develop-  Also canceled her 1-18 covid test

## 2020-01-16 DIAGNOSIS — Z20828 Contact with and (suspected) exposure to other viral communicable diseases: Secondary | ICD-10-CM | POA: Diagnosis not present

## 2020-01-18 ENCOUNTER — Encounter: Payer: PPO | Admitting: Gastroenterology

## 2020-01-20 ENCOUNTER — Other Ambulatory Visit: Payer: PPO

## 2020-01-25 ENCOUNTER — Telehealth (HOSPITAL_COMMUNITY): Payer: Self-pay

## 2020-01-26 ENCOUNTER — Ambulatory Visit: Payer: PPO | Admitting: Vascular Surgery

## 2020-01-31 DIAGNOSIS — I7101 Dissection of thoracic aorta: Secondary | ICD-10-CM | POA: Diagnosis not present

## 2020-01-31 DIAGNOSIS — J309 Allergic rhinitis, unspecified: Secondary | ICD-10-CM | POA: Diagnosis not present

## 2020-01-31 DIAGNOSIS — F4320 Adjustment disorder, unspecified: Secondary | ICD-10-CM | POA: Diagnosis not present

## 2020-01-31 DIAGNOSIS — J45909 Unspecified asthma, uncomplicated: Secondary | ICD-10-CM | POA: Diagnosis not present

## 2020-02-08 ENCOUNTER — Ambulatory Visit (AMBULATORY_SURGERY_CENTER): Payer: PPO | Admitting: *Deleted

## 2020-02-08 ENCOUNTER — Other Ambulatory Visit: Payer: Self-pay

## 2020-02-08 VITALS — Ht 62.0 in | Wt 160.0 lb

## 2020-02-08 DIAGNOSIS — K222 Esophageal obstruction: Secondary | ICD-10-CM

## 2020-02-08 DIAGNOSIS — Z01818 Encounter for other preprocedural examination: Secondary | ICD-10-CM

## 2020-02-08 NOTE — Progress Notes (Signed)
Patient's pre-visit was done today over the phone with the patient due to COVID-19 pandemic. Name,DOB and address verified. Insurance verified. Packet of Prep instructions mailed to patient including copy of a consent form -pt is aware. 2/22 Coupon included. Patient understands to call us back with any questions or concerns. COVID-19 screening test is on 2/22, the patient is aware. Pt is aware that care partner will wait in the car during procedure; if they feel like they will be too hot or cold to wait in the car; they may wait in the 4 th floor lobby. Patient is aware to bring only one care partner. We want them to wear a mask (we do not have any that we can provide them), practice social distancing, and we will check their temperatures when they get here.  I did remind the patient that their care partner needs to stay in the parking lot the entire time and have a cell phone available, we will call them when the pt is ready for discharge. Patient will wear mask into building.

## 2020-02-09 ENCOUNTER — Encounter: Payer: Self-pay | Admitting: Internal Medicine

## 2020-02-14 ENCOUNTER — Ambulatory Visit
Admission: RE | Admit: 2020-02-14 | Discharge: 2020-02-14 | Disposition: A | Discharge status: Home / Self Care | Payer: PPO | Source: Ambulatory Visit | Attending: Vascular Surgery | Admitting: Vascular Surgery

## 2020-02-14 DIAGNOSIS — K449 Diaphragmatic hernia without obstruction or gangrene: Secondary | ICD-10-CM | POA: Diagnosis not present

## 2020-02-14 DIAGNOSIS — I7100 Dissection of unspecified site of aorta: Secondary | ICD-10-CM

## 2020-02-14 DIAGNOSIS — I712 Thoracic aortic aneurysm, without rupture: Secondary | ICD-10-CM | POA: Diagnosis not present

## 2020-02-14 MED ORDER — IOPAMIDOL (ISOVUE-370) INJECTION 76%
75.0000 mL | Freq: Once | INTRAVENOUS | Status: AC | PRN
  Administered 2020-02-14: 75 mL via INTRAVENOUS

## 2020-02-15 ENCOUNTER — Other Ambulatory Visit: Payer: Self-pay

## 2020-02-15 DIAGNOSIS — I7100 Dissection of unspecified site of aorta: Secondary | ICD-10-CM

## 2020-02-20 ENCOUNTER — Ambulatory Visit (INDEPENDENT_AMBULATORY_CARE_PROVIDER_SITE_OTHER): Payer: PPO

## 2020-02-20 ENCOUNTER — Other Ambulatory Visit: Payer: Self-pay | Admitting: Gastroenterology

## 2020-02-20 DIAGNOSIS — Z1159 Encounter for screening for other viral diseases: Secondary | ICD-10-CM

## 2020-02-22 ENCOUNTER — Other Ambulatory Visit: Payer: Self-pay

## 2020-02-22 ENCOUNTER — Ambulatory Visit (AMBULATORY_SURGERY_CENTER): Payer: PPO | Admitting: Gastroenterology

## 2020-02-22 ENCOUNTER — Encounter: Payer: Self-pay | Admitting: Gastroenterology

## 2020-02-22 VITALS — BP 155/88 | HR 62 | Temp 97.3°F | Resp 14 | Ht 62.0 in | Wt 160.0 lb

## 2020-02-22 DIAGNOSIS — K219 Gastro-esophageal reflux disease without esophagitis: Secondary | ICD-10-CM | POA: Diagnosis not present

## 2020-02-22 DIAGNOSIS — I10 Essential (primary) hypertension: Secondary | ICD-10-CM | POA: Diagnosis not present

## 2020-02-22 DIAGNOSIS — K449 Diaphragmatic hernia without obstruction or gangrene: Secondary | ICD-10-CM | POA: Diagnosis not present

## 2020-02-22 DIAGNOSIS — R1310 Dysphagia, unspecified: Secondary | ICD-10-CM

## 2020-02-22 DIAGNOSIS — K222 Esophageal obstruction: Secondary | ICD-10-CM

## 2020-02-22 LAB — SARS CORONAVIRUS 2 (TAT 6-24 HRS): SARS Coronavirus 2: NEGATIVE

## 2020-02-22 MED ORDER — SODIUM CHLORIDE 0.9 % IV SOLN: 500.0000 mL | Freq: Once | INTRAVENOUS | Status: DC

## 2020-02-22 MED ORDER — OMEPRAZOLE 40 MG PO CPDR: 40.0000 mg | DELAYED_RELEASE_CAPSULE | Freq: Every day | ORAL | 11 refills | Status: DC

## 2020-02-22 NOTE — Op Note (Signed)
[facility] Patient Name: Melanie Nash Procedure Date: 02/22/2020 10:01 AM MRN: [id number] Endoscopist: Milus Banister , MD Age: 74 Referring MD:  Date of Birth: [birth year] Gender: Female Account #: [id number] Procedure:                Upper GI endoscopy Indications:              Dysphagia; (esophageal food impaction treatment                            with EGD 01/2016, Dr. Ardis Hughs; histor of GE junction                            stricture, dilated Dr. Olevia Perches 2015); EGD Dr. Ardis Hughs                            02/2016 EGD with dilation to 15.59mm; EGD 03/2016                            dilation to 44mm with balloon; EGD Dr. Ardis Hughs                            01/2019 dilated to 14.5. EGD Dr. Havery Moros 12/2019                            for acute food impaction Medicines:                Monitored Anesthesia Care Procedure:                Pre-Anesthesia Assessment:                           - Prior to the procedure, a History and Physical                            was performed, and patient medications and                            allergies were reviewed. The patient's tolerance of                            previous anesthesia was also reviewed. The risks                            and benefits of the procedure and the sedation                            options and risks were discussed with the patient.                            All questions were answered, and informed consent                            was obtained. Prior Anticoagulants: The patient has  taken no previous anticoagulant or antiplatelet                            agents. ASA Grade Assessment: II - A patient with                            mild systemic disease. After reviewing the risks                            and benefits, the patient was deemed in                            satisfactory condition to undergo the procedure.                           After obtaining informed consent,  the endoscope was                            passed under direct vision. Throughout the                            procedure, the patient's blood pressure, pulse, and                            oxygen saturations were monitored continuously. The                            Endoscope was introduced through the mouth, and                            advanced to the second part of duodenum. The upper                            GI endoscopy was accomplished without difficulty.                            The patient tolerated the procedure well. Scope In: Scope Out: Findings:                 One benign-appearing, intrinsic moderate stenosis                            was found at the gastroesophageal junction (focal                            peptic stricture). This stenosis measured 1 cm                            (inner diameter). A TTS dilator was passed through                            the scope. Dilation with a 13.5-14.5-15.5 mm  balloon dilator was performed to sequentially to                            15.5 mm and then held inflated at 15.75mm for one                            minute. There was typical superfical mucosal tear                            and self limited oozing of blood following dilation.                           3-4cm hiatal hernia.                           The exam was otherwise without abnormality. Complications:            No immediate complications. Estimated blood loss:                            None. Estimated Blood Loss:     Estimated blood loss: none. Impression:               - Benign-appearing GE junction peptic stricture                            above a medium sized hiatal hernia. Dilated to                            15.86mm.                           - The examination was otherwise normal. Recommendation:           - Patient has a contact number available for                            emergencies. The signs and symptoms of  potential                            delayed complications were discussed with the                            patient. Return to normal activities tomorrow.                            Written discharge instructions were provided to the                            patient.                           - Resume previous diet. Continue chewing your food                            well, eat slowly and take small bites.                           -  Continue present medications. New medicine called                            in today; omeprazole 40mg  pills, one pill once                            daily shortly before BF meal. Disp 30 with 11                            refills.                           - Repeat upper endoscopy in 6 weeks for retreatment                            (plan to dilated to 17 or 49mm if possible at that                            time).                           - Return office visit with Dr. Ardis Hughs, first                            available to evaluate your recently elevated liver                            tests. CBC, inr, CMET a few days prior to that                            appointmtent. Milus Banister, MD 02/22/2020 10:20:47 AM This report has been signed electronically.

## 2020-02-22 NOTE — Progress Notes (Signed)
Called to room to assist during endoscopic procedure.  Patient ID and intended procedure confirmed with present staff. Received instructions for my participation in the procedure from the performing physician.  

## 2020-02-22 NOTE — Patient Instructions (Signed)
YOU HAD AN ENDOSCOPIC PROCEDURE TODAY AT [facility]:   Refer to the procedure report that was given to you for any specific questions about what was found during the examination.  If the procedure report does not answer your questions, please call your gastroenterologist to clarify.  If you requested that your care partner not be given the details of your procedure findings, then the procedure report has been included in a sealed envelope for you to review at your convenience later.  **Handouts given on Esophagitis and strictures**  YOU SHOULD EXPECT: Some feelings of bloating in the abdomen. Passage of more gas than usual.  Walking can help get rid of the air that was put into your GI tract during the procedure and reduce the bloating. If you had a lower endoscopy (such as a colonoscopy or flexible sigmoidoscopy) you may notice spotting of blood in your stool or on the toilet paper. If you underwent a bowel prep for your procedure, you may not have a normal bowel movement for a few days.  Please Note:  You might notice some irritation and congestion in your nose or some drainage.  This is from the oxygen used during your procedure.  There is no need for concern and it should clear up in a day or so.  SYMPTOMS TO REPORT IMMEDIATELY:    Following upper endoscopy (EGD)  Vomiting of blood or coffee ground material  New chest pain or pain under the shoulder blades  Painful or persistently difficult swallowing  New shortness of breath  Fever of 100F or higher  Black, tarry-looking stools  For urgent or emergent issues, a gastroenterologist can be reached at any hour by calling [phone].   DIET:  We do recommend a small meal at first, but then you may proceed to your regular diet.  Drink plenty of fluids but you should avoid alcoholic beverages for 24 hours.  ACTIVITY:  You should plan to take it easy for the rest of today and you should NOT DRIVE or use heavy machinery  until tomorrow (because of the sedation medicines used during the test).    FOLLOW UP: Our staff will call the number listed on your records 48-72 hours following your procedure to check on you and address any questions or concerns that you may have regarding the information given to you following your procedure. If we do not reach you, we will leave a message.  We will attempt to reach you two times.  During this call, we will ask if you have developed any symptoms of COVID 19. If you develop any symptoms (ie: fever, flu-like symptoms, shortness of breath, cough etc.) before then, please call [phone].  If you test positive for Covid 19 in the 2 weeks post procedure, please call and report this information to [place].    If any biopsies were taken you will be contacted by phone or by letter within the next 1-3 weeks.  Please call us at [phone] if you have not heard about the biopsies in 3 weeks.    SIGNATURES/CONFIDENTIALITY: You and/or your care partner have signed paperwork which will be entered into your electronic medical record.  These signatures attest to the fact that that the information above on your After Visit Summary has been reviewed and is understood.  Full responsibility of the confidentiality of this discharge information lies with you and/or your care-partner.

## 2020-02-22 NOTE — Progress Notes (Signed)
Pt tolerated well. VSS. Awake and to recovery. 

## 2020-02-22 NOTE — Progress Notes (Signed)
Temp  LC  VS  DT  Pt's states no medical or surgical changes since previsit or office visit.    

## 2020-02-23 ENCOUNTER — Other Ambulatory Visit: Payer: Self-pay

## 2020-02-23 DIAGNOSIS — R7989 Other specified abnormal findings of blood chemistry: Secondary | ICD-10-CM

## 2020-02-24 ENCOUNTER — Telehealth: Payer: Self-pay

## 2020-02-24 NOTE — Telephone Encounter (Signed)
  Follow up Call-  Call back number 02/22/2020 02/09/2019  Post procedure Call Back phone  # [phone]  Permission to leave phone message Yes Yes  Some recent data might be hidden     Patient questions:  Do you have a fever, pain , or abdominal swelling? No. Pain Score  0 *  Have you tolerated food without any problems? Yes.    Have you been able to return to your normal activities? Yes.    Do you have any questions about your discharge instructions: Diet   No. Medications  No. Follow up visit  No.  Do you have questions or concerns about your Care? No.  Actions: * If pain score is 4 or above: No action needed, pain <4.  1. Have you developed a fever since your procedure? no  2.   Have you had an respiratory symptoms (SOB or cough) since your procedure? no  3.   Have you tested positive for COVID 19 since your procedure no  4.   Have you had any family members/close contacts diagnosed with the COVID 19 since your procedure?  no   If yes to any of these questions please route to Joylene John, RN and Alphonsa Gin, Therapist, sports.

## 2020-03-01 ENCOUNTER — Other Ambulatory Visit: Payer: Self-pay

## 2020-03-01 ENCOUNTER — Encounter: Payer: Self-pay | Admitting: Vascular Surgery

## 2020-03-01 ENCOUNTER — Ambulatory Visit (INDEPENDENT_AMBULATORY_CARE_PROVIDER_SITE_OTHER): Payer: PPO | Admitting: Vascular Surgery

## 2020-03-01 VITALS — BP 138/78 | HR 68 | Temp 97.9°F | Resp 20 | Ht 62.0 in | Wt 170.0 lb

## 2020-03-01 DIAGNOSIS — I712 Thoracic aortic aneurysm, without rupture, unspecified: Secondary | ICD-10-CM

## 2020-03-01 NOTE — Progress Notes (Signed)
     PCP: [name], MD  History of Present Illness: Melanie Nash is a 74 y.o. 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  She returns today for follow-up regarding her aortic intramural hematoma with CT scan.  She has had no chest or back pain.  She states she still has significant stress at home due to her spouse who has dementia.  She rarely checks her blood pressure.   Past Medical History:  Diagnosis Date  . Allergy   . Arthritis   . Diverticulosis   . Esophageal stricture   . Fibromyalgia   . Fibromyalgia   . GERD (gastroesophageal reflux disease)   . Hiatal hernia   . Hypertension    pt not taking prescribed bp med  . Seasonal allergies    sinus infection    Past Surgical History:  Procedure Laterality Date  . COLONOSCOPY    . ESOPHAGOGASTRODUODENOSCOPY     dysphagia  . ESOPHAGOGASTRODUODENOSCOPY N/A 02/26/2016   Procedure: ESOPHAGOGASTRODUODENOSCOPY (EGD);  Surgeon: Milus Banister, MD;  Location: [facility];  Service: Endoscopy;  Laterality: N/A;  . ESOPHAGOGASTRODUODENOSCOPY Left 01/02/2020   Procedure: ESOPHAGOGASTRODUODENOSCOPY (EGD);  Surgeon: Yetta Flock, MD;  [facility];  Service: Gastroenterology;  Laterality: Left;  . FOREIGN BODY REMOVAL  01/02/2020   Procedure: FOREIGN BODY REMOVAL;  Surgeon: Yetta Flock, MD;  Location: [facility];  Service: Gastroenterology;;    No outpatient medications have been marked as taking for the 03/01/20 encounter (Appointment) with Elam Dutch, MD.    Review of systems: She has no chest pain.  She has no shortness of breath.   Observations/Objective: I reviewed the patient's CT angiogram of the chest abdomen and pelvis.  This shows resolution of the intramural hematoma.  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  There is no  infrarenal abdominal aortic aneurysm component.  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  Assessment and Plan: Status post intramural hematoma now with descending thoracic aortic aneurysm currently asymptomatic 5 cm diameter  Patient will be scheduled for follow-up CT scan in 6 months.  She was advised on very tight blood pressure control.  Follow Up Instructions:  Patient will be scheduled for a follow-up CT angiogram of the chest abdomen and pelvis in 6 months.  Consideration for repair if she develops symptoms or enlarges to more than 6 cm in diameter.   Patient will purchase a blood pressure machine and start checking her blood pressure once daily and record this for her primary care physician and for [place] to review at her next office visit.  I again discussed extensively with her the importance of blood pressure control and prevention of her aneurysm from growing over time  Patient was also informed if she develops severe back or chest pain that she should go to the emergency room.   Signed, Ruta Hinds [facility] Office: [phone]  03/01/2020, 12:49 PM

## 2020-03-02 DIAGNOSIS — R9431 Abnormal electrocardiogram [ECG] [EKG]: Secondary | ICD-10-CM | POA: Diagnosis not present

## 2020-03-02 DIAGNOSIS — I10 Essential (primary) hypertension: Secondary | ICD-10-CM | POA: Diagnosis not present

## 2020-03-02 DIAGNOSIS — I719 Aortic aneurysm of unspecified site, without rupture: Secondary | ICD-10-CM | POA: Diagnosis not present

## 2020-03-02 DIAGNOSIS — R200 Anesthesia of skin: Secondary | ICD-10-CM | POA: Diagnosis not present

## 2020-03-02 DIAGNOSIS — R030 Elevated blood-pressure reading, without diagnosis of hypertension: Secondary | ICD-10-CM | POA: Diagnosis not present

## 2020-03-03 DIAGNOSIS — R9431 Abnormal electrocardiogram [ECG] [EKG]: Secondary | ICD-10-CM | POA: Diagnosis not present

## 2020-03-03 DIAGNOSIS — I10 Essential (primary) hypertension: Secondary | ICD-10-CM | POA: Diagnosis not present

## 2020-03-05 DIAGNOSIS — F4320 Adjustment disorder, unspecified: Secondary | ICD-10-CM | POA: Diagnosis not present

## 2020-03-05 DIAGNOSIS — F411 Generalized anxiety disorder: Secondary | ICD-10-CM | POA: Diagnosis not present

## 2020-03-05 DIAGNOSIS — J45909 Unspecified asthma, uncomplicated: Secondary | ICD-10-CM | POA: Diagnosis not present

## 2020-03-05 DIAGNOSIS — I7101 Dissection of thoracic aorta: Secondary | ICD-10-CM | POA: Diagnosis not present

## 2020-03-08 ENCOUNTER — Ambulatory Visit: Payer: PPO | Admitting: Vascular Surgery

## 2020-03-15 DIAGNOSIS — F411 Generalized anxiety disorder: Secondary | ICD-10-CM | POA: Diagnosis not present

## 2020-03-15 DIAGNOSIS — J45909 Unspecified asthma, uncomplicated: Secondary | ICD-10-CM | POA: Diagnosis not present

## 2020-03-15 DIAGNOSIS — I10 Essential (primary) hypertension: Secondary | ICD-10-CM | POA: Diagnosis not present

## 2020-03-15 DIAGNOSIS — I7101 Dissection of thoracic aorta: Secondary | ICD-10-CM | POA: Diagnosis not present

## 2020-03-19 DIAGNOSIS — K589 Irritable bowel syndrome without diarrhea: Secondary | ICD-10-CM | POA: Diagnosis not present

## 2020-03-19 DIAGNOSIS — I10 Essential (primary) hypertension: Secondary | ICD-10-CM | POA: Diagnosis not present

## 2020-03-19 DIAGNOSIS — J309 Allergic rhinitis, unspecified: Secondary | ICD-10-CM | POA: Diagnosis not present

## 2020-03-19 DIAGNOSIS — H00016 Hordeolum externum left eye, unspecified eyelid: Secondary | ICD-10-CM | POA: Diagnosis not present

## 2020-03-19 DIAGNOSIS — I712 Thoracic aortic aneurysm, without rupture: Secondary | ICD-10-CM | POA: Diagnosis not present

## 2020-03-19 DIAGNOSIS — M797 Fibromyalgia: Secondary | ICD-10-CM | POA: Diagnosis not present

## 2020-03-19 DIAGNOSIS — F411 Generalized anxiety disorder: Secondary | ICD-10-CM | POA: Diagnosis not present

## 2020-03-19 DIAGNOSIS — F4320 Adjustment disorder, unspecified: Secondary | ICD-10-CM | POA: Diagnosis not present

## 2020-03-19 DIAGNOSIS — K219 Gastro-esophageal reflux disease without esophagitis: Secondary | ICD-10-CM | POA: Diagnosis not present

## 2020-03-19 DIAGNOSIS — J45909 Unspecified asthma, uncomplicated: Secondary | ICD-10-CM | POA: Diagnosis not present

## 2020-03-19 DIAGNOSIS — Z9181 History of falling: Secondary | ICD-10-CM | POA: Diagnosis not present

## 2020-03-19 DIAGNOSIS — K5730 Diverticulosis of large intestine without perforation or abscess without bleeding: Secondary | ICD-10-CM | POA: Diagnosis not present

## 2020-03-19 DIAGNOSIS — F329 Major depressive disorder, single episode, unspecified: Secondary | ICD-10-CM | POA: Diagnosis not present

## 2020-03-21 DIAGNOSIS — K219 Gastro-esophageal reflux disease without esophagitis: Secondary | ICD-10-CM | POA: Diagnosis not present

## 2020-03-21 DIAGNOSIS — J309 Allergic rhinitis, unspecified: Secondary | ICD-10-CM | POA: Diagnosis not present

## 2020-03-21 DIAGNOSIS — I10 Essential (primary) hypertension: Secondary | ICD-10-CM | POA: Diagnosis not present

## 2020-03-21 DIAGNOSIS — K589 Irritable bowel syndrome without diarrhea: Secondary | ICD-10-CM | POA: Diagnosis not present

## 2020-03-21 DIAGNOSIS — K5730 Diverticulosis of large intestine without perforation or abscess without bleeding: Secondary | ICD-10-CM | POA: Diagnosis not present

## 2020-03-21 DIAGNOSIS — F329 Major depressive disorder, single episode, unspecified: Secondary | ICD-10-CM | POA: Diagnosis not present

## 2020-03-21 DIAGNOSIS — I712 Thoracic aortic aneurysm, without rupture: Secondary | ICD-10-CM | POA: Diagnosis not present

## 2020-03-21 DIAGNOSIS — J45909 Unspecified asthma, uncomplicated: Secondary | ICD-10-CM | POA: Diagnosis not present

## 2020-03-21 DIAGNOSIS — Z9181 History of falling: Secondary | ICD-10-CM | POA: Diagnosis not present

## 2020-03-21 DIAGNOSIS — F411 Generalized anxiety disorder: Secondary | ICD-10-CM | POA: Diagnosis not present

## 2020-03-21 DIAGNOSIS — M797 Fibromyalgia: Secondary | ICD-10-CM | POA: Diagnosis not present

## 2020-03-21 DIAGNOSIS — H00016 Hordeolum externum left eye, unspecified eyelid: Secondary | ICD-10-CM | POA: Diagnosis not present

## 2020-03-21 DIAGNOSIS — F4320 Adjustment disorder, unspecified: Secondary | ICD-10-CM | POA: Diagnosis not present

## 2020-03-26 DIAGNOSIS — K5730 Diverticulosis of large intestine without perforation or abscess without bleeding: Secondary | ICD-10-CM | POA: Diagnosis not present

## 2020-03-26 DIAGNOSIS — F329 Major depressive disorder, single episode, unspecified: Secondary | ICD-10-CM | POA: Diagnosis not present

## 2020-03-26 DIAGNOSIS — Z9181 History of falling: Secondary | ICD-10-CM | POA: Diagnosis not present

## 2020-03-26 DIAGNOSIS — K219 Gastro-esophageal reflux disease without esophagitis: Secondary | ICD-10-CM | POA: Diagnosis not present

## 2020-03-26 DIAGNOSIS — H10022 Other mucopurulent conjunctivitis, left eye: Secondary | ICD-10-CM | POA: Diagnosis not present

## 2020-03-26 DIAGNOSIS — J309 Allergic rhinitis, unspecified: Secondary | ICD-10-CM | POA: Diagnosis not present

## 2020-03-26 DIAGNOSIS — J45909 Unspecified asthma, uncomplicated: Secondary | ICD-10-CM | POA: Diagnosis not present

## 2020-03-26 DIAGNOSIS — F411 Generalized anxiety disorder: Secondary | ICD-10-CM | POA: Diagnosis not present

## 2020-03-26 DIAGNOSIS — I10 Essential (primary) hypertension: Secondary | ICD-10-CM | POA: Diagnosis not present

## 2020-03-26 DIAGNOSIS — I712 Thoracic aortic aneurysm, without rupture: Secondary | ICD-10-CM | POA: Diagnosis not present

## 2020-03-26 DIAGNOSIS — M797 Fibromyalgia: Secondary | ICD-10-CM | POA: Diagnosis not present

## 2020-03-26 DIAGNOSIS — H00016 Hordeolum externum left eye, unspecified eyelid: Secondary | ICD-10-CM | POA: Diagnosis not present

## 2020-03-26 DIAGNOSIS — F4320 Adjustment disorder, unspecified: Secondary | ICD-10-CM | POA: Diagnosis not present

## 2020-03-26 DIAGNOSIS — K589 Irritable bowel syndrome without diarrhea: Secondary | ICD-10-CM | POA: Diagnosis not present

## 2020-03-30 DIAGNOSIS — F4320 Adjustment disorder, unspecified: Secondary | ICD-10-CM | POA: Diagnosis not present

## 2020-03-30 DIAGNOSIS — F411 Generalized anxiety disorder: Secondary | ICD-10-CM | POA: Diagnosis not present

## 2020-03-30 DIAGNOSIS — M797 Fibromyalgia: Secondary | ICD-10-CM | POA: Diagnosis not present

## 2020-03-30 DIAGNOSIS — Z9181 History of falling: Secondary | ICD-10-CM | POA: Diagnosis not present

## 2020-03-30 DIAGNOSIS — J309 Allergic rhinitis, unspecified: Secondary | ICD-10-CM | POA: Diagnosis not present

## 2020-03-30 DIAGNOSIS — K589 Irritable bowel syndrome without diarrhea: Secondary | ICD-10-CM | POA: Diagnosis not present

## 2020-03-30 DIAGNOSIS — F329 Major depressive disorder, single episode, unspecified: Secondary | ICD-10-CM | POA: Diagnosis not present

## 2020-03-30 DIAGNOSIS — I10 Essential (primary) hypertension: Secondary | ICD-10-CM | POA: Diagnosis not present

## 2020-03-30 DIAGNOSIS — K5730 Diverticulosis of large intestine without perforation or abscess without bleeding: Secondary | ICD-10-CM | POA: Diagnosis not present

## 2020-03-30 DIAGNOSIS — H00016 Hordeolum externum left eye, unspecified eyelid: Secondary | ICD-10-CM | POA: Diagnosis not present

## 2020-03-30 DIAGNOSIS — K219 Gastro-esophageal reflux disease without esophagitis: Secondary | ICD-10-CM | POA: Diagnosis not present

## 2020-03-30 DIAGNOSIS — J45909 Unspecified asthma, uncomplicated: Secondary | ICD-10-CM | POA: Diagnosis not present

## 2020-03-30 DIAGNOSIS — I712 Thoracic aortic aneurysm, without rupture: Secondary | ICD-10-CM | POA: Diagnosis not present

## 2020-04-02 DIAGNOSIS — H00016 Hordeolum externum left eye, unspecified eyelid: Secondary | ICD-10-CM | POA: Diagnosis not present

## 2020-04-02 DIAGNOSIS — J45909 Unspecified asthma, uncomplicated: Secondary | ICD-10-CM | POA: Diagnosis not present

## 2020-04-02 DIAGNOSIS — K219 Gastro-esophageal reflux disease without esophagitis: Secondary | ICD-10-CM | POA: Diagnosis not present

## 2020-04-02 DIAGNOSIS — I712 Thoracic aortic aneurysm, without rupture: Secondary | ICD-10-CM | POA: Diagnosis not present

## 2020-04-02 DIAGNOSIS — M797 Fibromyalgia: Secondary | ICD-10-CM | POA: Diagnosis not present

## 2020-04-02 DIAGNOSIS — F329 Major depressive disorder, single episode, unspecified: Secondary | ICD-10-CM | POA: Diagnosis not present

## 2020-04-02 DIAGNOSIS — Z9181 History of falling: Secondary | ICD-10-CM | POA: Diagnosis not present

## 2020-04-02 DIAGNOSIS — F411 Generalized anxiety disorder: Secondary | ICD-10-CM | POA: Diagnosis not present

## 2020-04-02 DIAGNOSIS — J309 Allergic rhinitis, unspecified: Secondary | ICD-10-CM | POA: Diagnosis not present

## 2020-04-02 DIAGNOSIS — F4320 Adjustment disorder, unspecified: Secondary | ICD-10-CM | POA: Diagnosis not present

## 2020-04-02 DIAGNOSIS — K5730 Diverticulosis of large intestine without perforation or abscess without bleeding: Secondary | ICD-10-CM | POA: Diagnosis not present

## 2020-04-02 DIAGNOSIS — I10 Essential (primary) hypertension: Secondary | ICD-10-CM | POA: Diagnosis not present

## 2020-04-02 DIAGNOSIS — K589 Irritable bowel syndrome without diarrhea: Secondary | ICD-10-CM | POA: Diagnosis not present

## 2020-04-03 DIAGNOSIS — I10 Essential (primary) hypertension: Secondary | ICD-10-CM | POA: Diagnosis not present

## 2020-04-03 DIAGNOSIS — I7101 Dissection of thoracic aorta: Secondary | ICD-10-CM | POA: Diagnosis not present

## 2020-04-03 DIAGNOSIS — J45909 Unspecified asthma, uncomplicated: Secondary | ICD-10-CM | POA: Diagnosis not present

## 2020-04-03 DIAGNOSIS — F411 Generalized anxiety disorder: Secondary | ICD-10-CM | POA: Diagnosis not present

## 2020-04-03 DIAGNOSIS — E785 Hyperlipidemia, unspecified: Secondary | ICD-10-CM | POA: Diagnosis not present

## 2020-04-04 ENCOUNTER — Ambulatory Visit (INDEPENDENT_AMBULATORY_CARE_PROVIDER_SITE_OTHER): Payer: PPO | Admitting: Gastroenterology

## 2020-04-04 ENCOUNTER — Other Ambulatory Visit (INDEPENDENT_AMBULATORY_CARE_PROVIDER_SITE_OTHER): Payer: PPO

## 2020-04-04 ENCOUNTER — Encounter: Payer: Self-pay | Admitting: Gastroenterology

## 2020-04-04 ENCOUNTER — Other Ambulatory Visit: Payer: Self-pay

## 2020-04-04 VITALS — BP 132/92 | HR 60 | Temp 98.1°F | Ht 61.5 in | Wt 173.4 lb

## 2020-04-04 DIAGNOSIS — R7989 Other specified abnormal findings of blood chemistry: Secondary | ICD-10-CM | POA: Diagnosis not present

## 2020-04-04 LAB — CBC
HCT: 41.5 % (ref 36.0–46.0)
Hemoglobin: 14 g/dL (ref 12.0–15.0)
MCHC: 33.7 g/dL (ref 30.0–36.0)
MCV: 86.3 fl (ref 78.0–100.0)
Platelets: 216 10*3/uL (ref 150.0–400.0)
RBC: 4.81 Mil/uL (ref 3.87–5.11)
RDW: 14.2 % (ref 11.5–15.5)
WBC: 6.7 10*3/uL (ref 4.0–10.5)

## 2020-04-04 LAB — COMPREHENSIVE METABOLIC PANEL
ALT: 15 U/L (ref 0–35)
AST: 16 U/L (ref 0–37)
Albumin: 3.7 g/dL (ref 3.5–5.2)
Alkaline Phosphatase: 70 U/L (ref 39–117)
BUN: 22 mg/dL (ref 6–23)
CO2: 27 mEq/L (ref 19–32)
Calcium: 8.7 mg/dL (ref 8.4–10.5)
Chloride: 106 mEq/L (ref 96–112)
Creatinine, Ser: 1.02 mg/dL (ref 0.40–1.20)
GFR: 53.03 mL/min — ABNORMAL LOW (ref >60.00)
Glucose, Bld: 91 mg/dL (ref 70–99)
Potassium: 4.1 mEq/L (ref 3.5–5.1)
Sodium: 140 mEq/L (ref 135–145)
Total Bilirubin: 0.7 mg/dL (ref 0.2–1.2)
Total Protein: 6.4 g/dL (ref 6.0–8.3)

## 2020-04-04 LAB — PROTIME-INR
INR: 1 ratio (ref 0.8–1.0)
Prothrombin Time: 10.7 s (ref 9.6–13.1)

## 2020-04-04 NOTE — Patient Instructions (Signed)
If you are age 74 or older, your body mass index should be between 23-30. Your Body mass index is 32.23 kg/m. If this is out of the aforementioned range listed, please consider follow up with your Primary Care Provider.  If you are age 74 or younger, your body mass index should be between 19-25. Your Body mass index is 32.23 kg/m. If this is out of the aformentioned range listed, please consider follow up with your Primary Care Provider.   Your provider has requested that you go to the basement level for lab work before leaving today. Press "B" on the elevator. The lab is located at the first door on the left as you exit the elevator.  Please remember to complete your cologuard kit that you have at home.  Thank you for entrusting me with your care and choosing [facility].  Dr Ardis Hughs

## 2020-04-04 NOTE — Progress Notes (Signed)
Review of pertinent gastrointestinal problems: 1. Dysphagia; (esophageal food impaction treatment with EGD 01/2016, Dr. Ardis Hughs; histor of benign GE junction stricture, dilated Dr. Olevia Perches 2015); EGD Dr. Ardis Hughs 02/2016 EGD with dilation to 15.68m; EGD 03/2016  dilation to 232mwith balloon; EGD Dr. JaEdison Nasuti2/2020 dilated to 14.5. EGD Dr. ArHavery Moros/2021 for acute food impaction. EGD Dr. JaArdis Hughs/2021 dilated to 15.29m80m.  Elevated liver tests noted while in hospital with an aortic dissection October 2020.  AST 136, ALT 117, alk phos 186, total bilirubin 1.9.  INR 1.1 ultrasound October 2020 showed sludge in the gallbladder with a dilated CBD to 1.1 cm.  Acute viral hepatitis panel was negative for hepatitis A, B, C.  October 2020 MRI with MRCP showed "mild diffuse biliary ductal dilation, with common bile duct measuring 9 mm."  No sign of bile duct stones, no obvious masses in hepatobiliary system. 3.  Routine risk for colon cancer.  Colonoscopy Dr. BroOlevia Perchesovember 2010 showed no polyps or cancers.  Did show diverticulosis left colon.   HPI: This is a very pleasant 74 33ar old woman whom I last saw the time of an EGD for esophageal stricture dilation.  See those results summarized above.  She has felt very well since then.  She has had no dysphagia.  She has no heartburn as long she takes her omeprazole which she is now taking shortly before her breakfast meal on a daily basis.  She is here for follow-up of her elevated liver tests that were noted October 2020 when she was admitted for an aortic dissection.  She has never had hepatitis or jaundice that she is aware of.  Liver disease does not run in her family.  She is a nondrinker.  Acute viral hepatitis panel while she was hospitalized in October 2021 was negative.  Looking back in her records available in epic it does look like intermittently her total bilirubin is slightly elevated and also intermittently she has a slightly elevated transaminases.  She  has general fatigue since her aortic dissection  She has not been losing weight  We also discussed colon cancer screening.  She recently had a Cologuard stool kit sent to her house.  ROS: complete GI ROS as described in HPI, all other review negative.  Constitutional:  No unintentional weight loss   Past Medical History:  Diagnosis Date  . Allergy   . Arthritis   . Diverticulosis   . Esophageal stricture   . Fibromyalgia   . Fibromyalgia   . GERD (gastroesophageal reflux disease)   . Hiatal hernia   . Hypertension    pt not taking prescribed bp med  . Seasonal allergies    sinus infection    Past Surgical History:  Procedure Laterality Date  . COLONOSCOPY    . ESOPHAGOGASTRODUODENOSCOPY     dysphagia  . ESOPHAGOGASTRODUODENOSCOPY N/A 02/26/2016   Procedure: ESOPHAGOGASTRODUODENOSCOPY (EGD);  Surgeon: DanMilus BanisterD;  [facility];  Laterality: N/A;  . ESOPHAGOGASTRODUODENOSCOPY Left 01/02/2020   Procedure: ESOPHAGOGASTRODUODENOSCOPY (EGD);  Surgeon: ArmYetta FlockD;  [facility];  Service: Gastroenterology;  Laterality: Left;  . FOREIGN BODY REMOVAL  01/02/2020   Procedure: FOREIGN BODY REMOVAL;  Surgeon: ArmYetta FlockD;  [facility];  Service: Gastroenterology;;    Current Outpatient Medications  Medication Sig Dispense Refill  . ALPRAZolam (XANAX) 0.25 MG tablet Take 1 tablet by mouth as needed.    . Ascorbic Acid (VITAMIN C PO) Take by mouth.    .Marland Kitchen  carvedilol (COREG) 12.5 MG tablet Take 1 tablet (12.5 mg total) by mouth 2 (two) times daily with a meal. 180 tablet 1  . ELDERBERRY PO Take by mouth.    . hydrOXYzine (ATARAX/VISTARIL) 10 MG tablet Take 10 mg by mouth 3 (three) times daily.    Marland Kitchen losartan (COZAAR) 50 MG tablet Take 50 mg by mouth daily.    . Multiple Vitamins-Minerals (ZINC PO) Take by mouth.    Marland Kitchen omeprazole (PRILOSEC) 40 MG capsule Take 1 capsule (40 mg total) by mouth daily. 30  capsule 11  . POLYTRIM ophthalmic solution Place 1 drop into the left eye 4 (four) times daily.     No current facility-administered medications for this visit.    Allergies as of 04/04/2020  . (No Known Allergies)    Family History  Problem Relation Age of Onset  . Heart disease Mother   . Diabetes Sister   . Diabetes Maternal Uncle   . AAA (abdominal aortic aneurysm) Father   . Colon cancer Neg Hx   . Stomach cancer Neg Hx     Social History   Socioeconomic History  . Marital status: Married    Spouse name: Not on file  . Number of children: 2  . Years of education: Not on file  . Highest education level: Not on file  Occupational History  . Occupation: Technical brewer  Tobacco Use  . Smoking status: Never Smoker  . Smokeless tobacco: Never Used  Substance and Sexual Activity  . Alcohol use: No    Alcohol/week: 0.0 standard drinks  . Drug use: No  . Sexual activity: Not on file  Other Topics Concern  . Not on file  Social History Narrative  . Not on file   Social Determinants of Health   Financial Resource Strain:   . Difficulty of Paying Living Expenses:   Food Insecurity:   . Worried About Charity fundraiser in the Last Year:   . Arboriculturist in the Last Year:   Transportation Needs:   . Film/video editor (Medical):   Marland Kitchen Lack of Transportation (Non-Medical):   Physical Activity:   . Days of Exercise per Week:   . Minutes of Exercise per Session:   Stress:   . Feeling of Stress :   Social Connections:   . Frequency of Communication with Friends and Family:   . Frequency of Social Gatherings with Friends and Family:   . Attends Religious Services:   . Active Member of Clubs or Organizations:   . Attends Archivist Meetings:   Marland Kitchen Marital Status:   Intimate Partner Violence:   . Fear of Current or Ex-Partner:   . Emotionally Abused:   Marland Kitchen Physically Abused:   . Sexually Abused:      Physical Exam: Temp 98.1 F (36.7 C)   Ht 5' 1.5"  (1.562 m) Comment: height measured without shoes  Wt 173 lb 6 oz (78.6 kg)   BMI 32.23 kg/m  Constitutional: generally well-appearing Psychiatric: alert and oriented x3 Abdomen: soft, nontender, nondistended, no obvious ascites, no peritoneal signs, normal bowel sounds No peripheral edema noted in lower extremities  Assessment and plan: 74 y.o. female with elevated liver tests, routine risk for colon cancer  First she is going to complete her Cologuard stool kit and she understands that if it is positive she will need diagnostic colonoscopy.  Unclear etiology of her elevated liver tests back in October 2020.  She is going to have a  repeat CBC and hepatic function panel and be met today.  Based on that she understands she might need further testing especially of her hepatobiliary system possibly with endoscopic ultrasound evaluation of her ampulla and bile duct.  Please see the "Patient Instructions" section for addition details about the plan.  Owens Loffler, MD [facility] 04/04/2020, 9:07 AM   Total time on date of encounter was 30 minutes (this included time spent preparing to see the patient reviewing records; obtaining and/or reviewing separately obtained history; performing a medically appropriate exam and/or evaluation; counseling and educating the patient and family if present; ordering medications, tests or procedures if applicable; and documenting clinical information in the health record).

## 2020-04-05 DIAGNOSIS — H04412 Chronic dacryocystitis of left lacrimal passage: Secondary | ICD-10-CM | POA: Diagnosis not present

## 2020-04-05 DIAGNOSIS — H10022 Other mucopurulent conjunctivitis, left eye: Secondary | ICD-10-CM | POA: Diagnosis not present

## 2020-04-09 DIAGNOSIS — M797 Fibromyalgia: Secondary | ICD-10-CM | POA: Diagnosis not present

## 2020-04-09 DIAGNOSIS — F411 Generalized anxiety disorder: Secondary | ICD-10-CM | POA: Diagnosis not present

## 2020-04-09 DIAGNOSIS — K219 Gastro-esophageal reflux disease without esophagitis: Secondary | ICD-10-CM | POA: Diagnosis not present

## 2020-04-09 DIAGNOSIS — K5730 Diverticulosis of large intestine without perforation or abscess without bleeding: Secondary | ICD-10-CM | POA: Diagnosis not present

## 2020-04-09 DIAGNOSIS — I712 Thoracic aortic aneurysm, without rupture: Secondary | ICD-10-CM | POA: Diagnosis not present

## 2020-04-09 DIAGNOSIS — J309 Allergic rhinitis, unspecified: Secondary | ICD-10-CM | POA: Diagnosis not present

## 2020-04-09 DIAGNOSIS — F329 Major depressive disorder, single episode, unspecified: Secondary | ICD-10-CM | POA: Diagnosis not present

## 2020-04-09 DIAGNOSIS — Z9181 History of falling: Secondary | ICD-10-CM | POA: Diagnosis not present

## 2020-04-09 DIAGNOSIS — F4320 Adjustment disorder, unspecified: Secondary | ICD-10-CM | POA: Diagnosis not present

## 2020-04-09 DIAGNOSIS — I10 Essential (primary) hypertension: Secondary | ICD-10-CM | POA: Diagnosis not present

## 2020-04-09 DIAGNOSIS — H00016 Hordeolum externum left eye, unspecified eyelid: Secondary | ICD-10-CM | POA: Diagnosis not present

## 2020-04-09 DIAGNOSIS — K589 Irritable bowel syndrome without diarrhea: Secondary | ICD-10-CM | POA: Diagnosis not present

## 2020-04-09 DIAGNOSIS — J45909 Unspecified asthma, uncomplicated: Secondary | ICD-10-CM | POA: Diagnosis not present

## 2020-04-15 DIAGNOSIS — I10 Essential (primary) hypertension: Secondary | ICD-10-CM | POA: Diagnosis not present

## 2020-04-15 DIAGNOSIS — F411 Generalized anxiety disorder: Secondary | ICD-10-CM | POA: Diagnosis not present

## 2020-04-15 DIAGNOSIS — I7101 Dissection of thoracic aorta: Secondary | ICD-10-CM | POA: Diagnosis not present

## 2020-04-15 DIAGNOSIS — R296 Repeated falls: Secondary | ICD-10-CM | POA: Diagnosis not present

## 2020-04-19 LAB — COLOGUARD

## 2020-04-23 DIAGNOSIS — K589 Irritable bowel syndrome without diarrhea: Secondary | ICD-10-CM | POA: Diagnosis not present

## 2020-04-23 DIAGNOSIS — J45909 Unspecified asthma, uncomplicated: Secondary | ICD-10-CM | POA: Diagnosis not present

## 2020-04-23 DIAGNOSIS — I712 Thoracic aortic aneurysm, without rupture: Secondary | ICD-10-CM | POA: Diagnosis not present

## 2020-04-23 DIAGNOSIS — K5730 Diverticulosis of large intestine without perforation or abscess without bleeding: Secondary | ICD-10-CM | POA: Diagnosis not present

## 2020-04-23 DIAGNOSIS — K219 Gastro-esophageal reflux disease without esophagitis: Secondary | ICD-10-CM | POA: Diagnosis not present

## 2020-04-23 DIAGNOSIS — I10 Essential (primary) hypertension: Secondary | ICD-10-CM | POA: Diagnosis not present

## 2020-04-23 DIAGNOSIS — F329 Major depressive disorder, single episode, unspecified: Secondary | ICD-10-CM | POA: Diagnosis not present

## 2020-04-23 DIAGNOSIS — Z9181 History of falling: Secondary | ICD-10-CM | POA: Diagnosis not present

## 2020-04-23 DIAGNOSIS — M797 Fibromyalgia: Secondary | ICD-10-CM | POA: Diagnosis not present

## 2020-04-23 DIAGNOSIS — H00016 Hordeolum externum left eye, unspecified eyelid: Secondary | ICD-10-CM | POA: Diagnosis not present

## 2020-04-23 DIAGNOSIS — F411 Generalized anxiety disorder: Secondary | ICD-10-CM | POA: Diagnosis not present

## 2020-04-23 DIAGNOSIS — F4320 Adjustment disorder, unspecified: Secondary | ICD-10-CM | POA: Diagnosis not present

## 2020-04-23 DIAGNOSIS — J309 Allergic rhinitis, unspecified: Secondary | ICD-10-CM | POA: Diagnosis not present

## 2020-04-30 DIAGNOSIS — M797 Fibromyalgia: Secondary | ICD-10-CM | POA: Diagnosis not present

## 2020-04-30 DIAGNOSIS — H00016 Hordeolum externum left eye, unspecified eyelid: Secondary | ICD-10-CM | POA: Diagnosis not present

## 2020-04-30 DIAGNOSIS — F4320 Adjustment disorder, unspecified: Secondary | ICD-10-CM | POA: Diagnosis not present

## 2020-04-30 DIAGNOSIS — F329 Major depressive disorder, single episode, unspecified: Secondary | ICD-10-CM | POA: Diagnosis not present

## 2020-04-30 DIAGNOSIS — I10 Essential (primary) hypertension: Secondary | ICD-10-CM | POA: Diagnosis not present

## 2020-04-30 DIAGNOSIS — J45909 Unspecified asthma, uncomplicated: Secondary | ICD-10-CM | POA: Diagnosis not present

## 2020-04-30 DIAGNOSIS — Z9181 History of falling: Secondary | ICD-10-CM | POA: Diagnosis not present

## 2020-04-30 DIAGNOSIS — J309 Allergic rhinitis, unspecified: Secondary | ICD-10-CM | POA: Diagnosis not present

## 2020-04-30 DIAGNOSIS — K5730 Diverticulosis of large intestine without perforation or abscess without bleeding: Secondary | ICD-10-CM | POA: Diagnosis not present

## 2020-04-30 DIAGNOSIS — K219 Gastro-esophageal reflux disease without esophagitis: Secondary | ICD-10-CM | POA: Diagnosis not present

## 2020-04-30 DIAGNOSIS — K589 Irritable bowel syndrome without diarrhea: Secondary | ICD-10-CM | POA: Diagnosis not present

## 2020-04-30 DIAGNOSIS — F411 Generalized anxiety disorder: Secondary | ICD-10-CM | POA: Diagnosis not present

## 2020-04-30 DIAGNOSIS — I712 Thoracic aortic aneurysm, without rupture: Secondary | ICD-10-CM | POA: Diagnosis not present

## 2020-05-03 DIAGNOSIS — I7101 Dissection of thoracic aorta: Secondary | ICD-10-CM | POA: Diagnosis not present

## 2020-05-03 DIAGNOSIS — F411 Generalized anxiety disorder: Secondary | ICD-10-CM | POA: Diagnosis not present

## 2020-05-03 DIAGNOSIS — R7301 Impaired fasting glucose: Secondary | ICD-10-CM | POA: Diagnosis not present

## 2020-05-03 DIAGNOSIS — E559 Vitamin D deficiency, unspecified: Secondary | ICD-10-CM | POA: Diagnosis not present

## 2020-05-03 DIAGNOSIS — E785 Hyperlipidemia, unspecified: Secondary | ICD-10-CM | POA: Diagnosis not present

## 2020-05-03 DIAGNOSIS — I10 Essential (primary) hypertension: Secondary | ICD-10-CM | POA: Diagnosis not present

## 2020-05-03 DIAGNOSIS — K219 Gastro-esophageal reflux disease without esophagitis: Secondary | ICD-10-CM | POA: Diagnosis not present

## 2020-05-03 DIAGNOSIS — F329 Major depressive disorder, single episode, unspecified: Secondary | ICD-10-CM | POA: Diagnosis not present

## 2020-05-06 LAB — COLOGUARD

## 2020-05-09 DIAGNOSIS — M797 Fibromyalgia: Secondary | ICD-10-CM | POA: Diagnosis not present

## 2020-05-09 DIAGNOSIS — I10 Essential (primary) hypertension: Secondary | ICD-10-CM | POA: Diagnosis not present

## 2020-05-09 DIAGNOSIS — K589 Irritable bowel syndrome without diarrhea: Secondary | ICD-10-CM | POA: Diagnosis not present

## 2020-05-09 DIAGNOSIS — Z9181 History of falling: Secondary | ICD-10-CM | POA: Diagnosis not present

## 2020-05-09 DIAGNOSIS — I712 Thoracic aortic aneurysm, without rupture: Secondary | ICD-10-CM | POA: Diagnosis not present

## 2020-05-09 DIAGNOSIS — K5730 Diverticulosis of large intestine without perforation or abscess without bleeding: Secondary | ICD-10-CM | POA: Diagnosis not present

## 2020-05-09 DIAGNOSIS — J45909 Unspecified asthma, uncomplicated: Secondary | ICD-10-CM | POA: Diagnosis not present

## 2020-05-09 DIAGNOSIS — J309 Allergic rhinitis, unspecified: Secondary | ICD-10-CM | POA: Diagnosis not present

## 2020-05-09 DIAGNOSIS — H00016 Hordeolum externum left eye, unspecified eyelid: Secondary | ICD-10-CM | POA: Diagnosis not present

## 2020-05-09 DIAGNOSIS — K219 Gastro-esophageal reflux disease without esophagitis: Secondary | ICD-10-CM | POA: Diagnosis not present

## 2020-05-09 DIAGNOSIS — F4320 Adjustment disorder, unspecified: Secondary | ICD-10-CM | POA: Diagnosis not present

## 2020-05-09 DIAGNOSIS — F411 Generalized anxiety disorder: Secondary | ICD-10-CM | POA: Diagnosis not present

## 2020-05-09 DIAGNOSIS — F329 Major depressive disorder, single episode, unspecified: Secondary | ICD-10-CM | POA: Diagnosis not present

## 2020-05-11 ENCOUNTER — Telehealth: Payer: Self-pay | Admitting: *Deleted

## 2020-06-01 DIAGNOSIS — S838X1A Sprain of other specified parts of right knee, initial encounter: Secondary | ICD-10-CM | POA: Diagnosis not present

## 2020-06-05 DIAGNOSIS — I10 Essential (primary) hypertension: Secondary | ICD-10-CM | POA: Diagnosis not present

## 2020-06-05 DIAGNOSIS — E785 Hyperlipidemia, unspecified: Secondary | ICD-10-CM | POA: Diagnosis not present

## 2020-06-05 DIAGNOSIS — I7101 Dissection of thoracic aorta: Secondary | ICD-10-CM | POA: Diagnosis not present

## 2020-06-05 DIAGNOSIS — F411 Generalized anxiety disorder: Secondary | ICD-10-CM | POA: Diagnosis not present

## 2020-06-29 ENCOUNTER — Telehealth: Payer: Self-pay

## 2020-06-29 NOTE — Telephone Encounter (Signed)
NOTES ON FILE FROM [facility] AT [facility] [phone] SENT REFERRAL TO [facility]

## 2020-07-05 DIAGNOSIS — Z1212 Encounter for screening for malignant neoplasm of rectum: Secondary | ICD-10-CM | POA: Diagnosis not present

## 2020-07-05 DIAGNOSIS — Z1211 Encounter for screening for malignant neoplasm of colon: Secondary | ICD-10-CM | POA: Diagnosis not present

## 2020-07-11 DIAGNOSIS — F411 Generalized anxiety disorder: Secondary | ICD-10-CM | POA: Diagnosis not present

## 2020-07-11 DIAGNOSIS — I7101 Dissection of thoracic aorta: Secondary | ICD-10-CM | POA: Diagnosis not present

## 2020-07-11 DIAGNOSIS — E785 Hyperlipidemia, unspecified: Secondary | ICD-10-CM | POA: Diagnosis not present

## 2020-07-11 DIAGNOSIS — I10 Essential (primary) hypertension: Secondary | ICD-10-CM | POA: Diagnosis not present

## 2020-07-11 LAB — COLOGUARD: COLOGUARD: NEGATIVE

## 2020-07-25 ENCOUNTER — Other Ambulatory Visit: Payer: Self-pay | Admitting: Family Medicine

## 2020-07-25 DIAGNOSIS — I712 Thoracic aortic aneurysm, without rupture, unspecified: Secondary | ICD-10-CM

## 2020-08-03 ENCOUNTER — Other Ambulatory Visit: Payer: Self-pay

## 2020-08-03 ENCOUNTER — Ambulatory Visit
Admission: RE | Admit: 2020-08-03 | Discharge: 2020-08-03 | Disposition: A | Discharge status: Home / Self Care | Payer: PPO | Source: Ambulatory Visit | Attending: Family Medicine | Admitting: Family Medicine

## 2020-08-03 DIAGNOSIS — I712 Thoracic aortic aneurysm, without rupture, unspecified: Secondary | ICD-10-CM

## 2020-08-03 DIAGNOSIS — I2510 Atherosclerotic heart disease of native coronary artery without angina pectoris: Secondary | ICD-10-CM | POA: Diagnosis not present

## 2020-08-03 DIAGNOSIS — I700 Atherosclerosis of aorta: Secondary | ICD-10-CM | POA: Diagnosis not present

## 2020-08-03 DIAGNOSIS — I7101 Dissection of thoracic aorta: Secondary | ICD-10-CM | POA: Diagnosis not present

## 2020-08-03 MED ORDER — IOPAMIDOL (ISOVUE-370) INJECTION 76%
75.0000 mL | Freq: Once | INTRAVENOUS | Status: AC | PRN
  Administered 2020-08-03: 75 mL via INTRAVENOUS

## 2020-08-08 DIAGNOSIS — W19XXXA Unspecified fall, initial encounter: Secondary | ICD-10-CM | POA: Diagnosis not present

## 2020-08-08 DIAGNOSIS — T1490XA Injury, unspecified, initial encounter: Secondary | ICD-10-CM | POA: Diagnosis not present

## 2020-08-08 DIAGNOSIS — I7101 Dissection of thoracic aorta: Secondary | ICD-10-CM | POA: Diagnosis not present

## 2020-08-08 DIAGNOSIS — S32009G Unspecified fracture of unspecified lumbar vertebra, subsequent encounter for fracture with delayed healing: Secondary | ICD-10-CM | POA: Diagnosis not present

## 2020-08-08 DIAGNOSIS — I10 Essential (primary) hypertension: Secondary | ICD-10-CM | POA: Diagnosis not present

## 2020-08-08 DIAGNOSIS — J019 Acute sinusitis, unspecified: Secondary | ICD-10-CM | POA: Diagnosis not present

## 2020-08-08 DIAGNOSIS — E785 Hyperlipidemia, unspecified: Secondary | ICD-10-CM | POA: Diagnosis not present

## 2020-08-08 DIAGNOSIS — F411 Generalized anxiety disorder: Secondary | ICD-10-CM | POA: Diagnosis not present

## 2020-08-14 ENCOUNTER — Ambulatory Visit: Payer: PPO | Admitting: Cardiovascular Disease

## 2020-08-16 ENCOUNTER — Ambulatory Visit: Payer: PPO | Admitting: Vascular Surgery

## 2020-08-17 ENCOUNTER — Ambulatory Visit: Payer: PPO | Admitting: Cardiovascular Disease

## 2020-08-22 ENCOUNTER — Encounter: Payer: PPO | Admitting: Surgery

## 2020-08-22 DIAGNOSIS — I712 Thoracic aortic aneurysm, without rupture: Secondary | ICD-10-CM | POA: Diagnosis not present

## 2020-08-24 DIAGNOSIS — F411 Generalized anxiety disorder: Secondary | ICD-10-CM | POA: Diagnosis not present

## 2020-08-24 DIAGNOSIS — J019 Acute sinusitis, unspecified: Secondary | ICD-10-CM | POA: Diagnosis not present

## 2020-08-24 DIAGNOSIS — I7101 Dissection of thoracic aorta: Secondary | ICD-10-CM | POA: Diagnosis not present

## 2020-08-24 DIAGNOSIS — I10 Essential (primary) hypertension: Secondary | ICD-10-CM | POA: Diagnosis not present

## 2020-09-20 ENCOUNTER — Ambulatory Visit: Payer: PPO | Admitting: Vascular Surgery

## 2020-10-15 DIAGNOSIS — H353132 Nonexudative age-related macular degeneration, bilateral, intermediate dry stage: Secondary | ICD-10-CM | POA: Diagnosis not present

## 2020-11-08 DIAGNOSIS — F411 Generalized anxiety disorder: Secondary | ICD-10-CM | POA: Diagnosis not present

## 2020-11-08 DIAGNOSIS — J019 Acute sinusitis, unspecified: Secondary | ICD-10-CM | POA: Diagnosis not present

## 2020-11-08 DIAGNOSIS — I10 Essential (primary) hypertension: Secondary | ICD-10-CM | POA: Diagnosis not present

## 2020-11-08 DIAGNOSIS — I7101 Dissection of thoracic aorta: Secondary | ICD-10-CM | POA: Diagnosis not present

## 2020-11-14 DIAGNOSIS — S3992XA Unspecified injury of lower back, initial encounter: Secondary | ICD-10-CM | POA: Diagnosis not present

## 2020-11-14 DIAGNOSIS — I10 Essential (primary) hypertension: Secondary | ICD-10-CM | POA: Diagnosis not present

## 2020-11-14 DIAGNOSIS — W19XXXA Unspecified fall, initial encounter: Secondary | ICD-10-CM | POA: Diagnosis not present

## 2020-11-14 DIAGNOSIS — T1490XA Injury, unspecified, initial encounter: Secondary | ICD-10-CM | POA: Diagnosis not present

## 2020-11-15 DIAGNOSIS — R2681 Unsteadiness on feet: Secondary | ICD-10-CM | POA: Diagnosis not present

## 2020-11-15 DIAGNOSIS — Y998 Other external cause status: Secondary | ICD-10-CM | POA: Diagnosis not present

## 2020-11-15 DIAGNOSIS — M545 Low back pain, unspecified: Secondary | ICD-10-CM | POA: Diagnosis not present

## 2020-11-15 DIAGNOSIS — S32019D Unspecified fracture of first lumbar vertebra, subsequent encounter for fracture with routine healing: Secondary | ICD-10-CM | POA: Diagnosis not present

## 2020-11-15 DIAGNOSIS — S32018A Other fracture of first lumbar vertebra, initial encounter for closed fracture: Secondary | ICD-10-CM | POA: Diagnosis not present

## 2020-11-15 DIAGNOSIS — W19XXXA Unspecified fall, initial encounter: Secondary | ICD-10-CM | POA: Diagnosis not present

## 2020-11-15 DIAGNOSIS — M5459 Other low back pain: Secondary | ICD-10-CM | POA: Diagnosis not present

## 2020-11-15 DIAGNOSIS — M47816 Spondylosis without myelopathy or radiculopathy, lumbar region: Secondary | ICD-10-CM | POA: Diagnosis not present

## 2020-11-15 DIAGNOSIS — K449 Diaphragmatic hernia without obstruction or gangrene: Secondary | ICD-10-CM | POA: Diagnosis not present

## 2020-11-15 DIAGNOSIS — R2689 Other abnormalities of gait and mobility: Secondary | ICD-10-CM | POA: Diagnosis not present

## 2020-11-15 DIAGNOSIS — W19XXXS Unspecified fall, sequela: Secondary | ICD-10-CM | POA: Diagnosis not present

## 2020-11-15 DIAGNOSIS — M6281 Muscle weakness (generalized): Secondary | ICD-10-CM | POA: Diagnosis not present

## 2020-11-15 DIAGNOSIS — W1839XA Other fall on same level, initial encounter: Secondary | ICD-10-CM | POA: Diagnosis not present

## 2020-11-15 DIAGNOSIS — J90 Pleural effusion, not elsewhere classified: Secondary | ICD-10-CM | POA: Diagnosis not present

## 2020-11-15 DIAGNOSIS — R339 Retention of urine, unspecified: Secondary | ICD-10-CM | POA: Diagnosis not present

## 2020-11-15 DIAGNOSIS — S32022A Unstable burst fracture of second lumbar vertebra, initial encounter for closed fracture: Secondary | ICD-10-CM | POA: Diagnosis not present

## 2020-11-15 DIAGNOSIS — I2699 Other pulmonary embolism without acute cor pulmonale: Secondary | ICD-10-CM | POA: Diagnosis not present

## 2020-11-15 DIAGNOSIS — Y92009 Unspecified place in unspecified non-institutional (private) residence as the place of occurrence of the external cause: Secondary | ICD-10-CM | POA: Diagnosis not present

## 2020-11-15 DIAGNOSIS — G2581 Restless legs syndrome: Secondary | ICD-10-CM | POA: Diagnosis not present

## 2020-11-15 DIAGNOSIS — S32011A Stable burst fracture of first lumbar vertebra, initial encounter for closed fracture: Secondary | ICD-10-CM | POA: Diagnosis not present

## 2020-11-15 DIAGNOSIS — R278 Other lack of coordination: Secondary | ICD-10-CM | POA: Diagnosis not present

## 2020-11-15 DIAGNOSIS — J9601 Acute respiratory failure with hypoxia: Secondary | ICD-10-CM | POA: Diagnosis not present

## 2020-11-15 DIAGNOSIS — Z8249 Family history of ischemic heart disease and other diseases of the circulatory system: Secondary | ICD-10-CM | POA: Diagnosis not present

## 2020-11-15 DIAGNOSIS — U071 COVID-19: Secondary | ICD-10-CM | POA: Diagnosis not present

## 2020-11-15 DIAGNOSIS — R296 Repeated falls: Secondary | ICD-10-CM | POA: Diagnosis not present

## 2020-11-15 DIAGNOSIS — S32010A Wedge compression fracture of first lumbar vertebra, initial encounter for closed fracture: Secondary | ICD-10-CM | POA: Diagnosis not present

## 2020-11-15 DIAGNOSIS — I10 Essential (primary) hypertension: Secondary | ICD-10-CM | POA: Diagnosis not present

## 2020-11-15 DIAGNOSIS — R0602 Shortness of breath: Secondary | ICD-10-CM | POA: Diagnosis not present

## 2020-11-15 DIAGNOSIS — Z79899 Other long term (current) drug therapy: Secondary | ICD-10-CM | POA: Diagnosis not present

## 2020-11-15 DIAGNOSIS — M25559 Pain in unspecified hip: Secondary | ICD-10-CM | POA: Diagnosis not present

## 2020-11-15 DIAGNOSIS — J1282 Pneumonia due to coronavirus disease 2019: Secondary | ICD-10-CM | POA: Diagnosis not present

## 2020-11-15 DIAGNOSIS — R0902 Hypoxemia: Secondary | ICD-10-CM | POA: Diagnosis not present

## 2020-11-15 DIAGNOSIS — R059 Cough, unspecified: Secondary | ICD-10-CM | POA: Diagnosis not present

## 2020-11-15 DIAGNOSIS — R102 Pelvic and perineal pain: Secondary | ICD-10-CM | POA: Diagnosis not present

## 2020-11-15 DIAGNOSIS — Z9181 History of falling: Secondary | ICD-10-CM | POA: Diagnosis not present

## 2020-11-15 DIAGNOSIS — S32019K Unspecified fracture of first lumbar vertebra, subsequent encounter for fracture with nonunion: Secondary | ICD-10-CM | POA: Diagnosis not present

## 2020-11-15 DIAGNOSIS — S32019A Unspecified fracture of first lumbar vertebra, initial encounter for closed fracture: Secondary | ICD-10-CM | POA: Diagnosis not present

## 2020-11-15 DIAGNOSIS — I700 Atherosclerosis of aorta: Secondary | ICD-10-CM | POA: Diagnosis not present

## 2020-11-15 DIAGNOSIS — R488 Other symbolic dysfunctions: Secondary | ICD-10-CM | POA: Diagnosis not present

## 2020-11-15 DIAGNOSIS — Z7401 Bed confinement status: Secondary | ICD-10-CM | POA: Diagnosis not present

## 2020-11-15 DIAGNOSIS — J189 Pneumonia, unspecified organism: Secondary | ICD-10-CM | POA: Diagnosis not present

## 2020-11-15 DIAGNOSIS — M2550 Pain in unspecified joint: Secondary | ICD-10-CM | POA: Diagnosis not present

## 2020-11-18 DIAGNOSIS — J189 Pneumonia, unspecified organism: Secondary | ICD-10-CM | POA: Diagnosis not present

## 2020-11-18 DIAGNOSIS — I2699 Other pulmonary embolism without acute cor pulmonale: Secondary | ICD-10-CM | POA: Diagnosis not present

## 2020-11-18 DIAGNOSIS — R0602 Shortness of breath: Secondary | ICD-10-CM | POA: Diagnosis not present

## 2020-11-18 DIAGNOSIS — K449 Diaphragmatic hernia without obstruction or gangrene: Secondary | ICD-10-CM | POA: Diagnosis not present

## 2020-11-18 DIAGNOSIS — R059 Cough, unspecified: Secondary | ICD-10-CM | POA: Diagnosis not present

## 2020-11-27 DIAGNOSIS — R278 Other lack of coordination: Secondary | ICD-10-CM | POA: Diagnosis not present

## 2020-11-27 DIAGNOSIS — B952 Enterococcus as the cause of diseases classified elsewhere: Secondary | ICD-10-CM | POA: Diagnosis not present

## 2020-11-27 DIAGNOSIS — E785 Hyperlipidemia, unspecified: Secondary | ICD-10-CM | POA: Diagnosis not present

## 2020-11-27 DIAGNOSIS — W19XXXA Unspecified fall, initial encounter: Secondary | ICD-10-CM | POA: Diagnosis not present

## 2020-11-27 DIAGNOSIS — M6281 Muscle weakness (generalized): Secondary | ICD-10-CM | POA: Diagnosis not present

## 2020-11-27 DIAGNOSIS — J9601 Acute respiratory failure with hypoxia: Secondary | ICD-10-CM | POA: Diagnosis not present

## 2020-11-27 DIAGNOSIS — E781 Pure hyperglyceridemia: Secondary | ICD-10-CM | POA: Diagnosis not present

## 2020-11-27 DIAGNOSIS — S32019D Unspecified fracture of first lumbar vertebra, subsequent encounter for fracture with routine healing: Secondary | ICD-10-CM | POA: Diagnosis not present

## 2020-11-27 DIAGNOSIS — M5459 Other low back pain: Secondary | ICD-10-CM | POA: Diagnosis not present

## 2020-11-27 DIAGNOSIS — R296 Repeated falls: Secondary | ICD-10-CM | POA: Diagnosis not present

## 2020-11-27 DIAGNOSIS — K5900 Constipation, unspecified: Secondary | ICD-10-CM | POA: Diagnosis not present

## 2020-11-27 DIAGNOSIS — I10 Essential (primary) hypertension: Secondary | ICD-10-CM | POA: Diagnosis not present

## 2020-11-27 DIAGNOSIS — U071 COVID-19: Secondary | ICD-10-CM | POA: Diagnosis not present

## 2020-11-27 DIAGNOSIS — R339 Retention of urine, unspecified: Secondary | ICD-10-CM | POA: Diagnosis not present

## 2020-11-27 DIAGNOSIS — R488 Other symbolic dysfunctions: Secondary | ICD-10-CM | POA: Diagnosis not present

## 2020-11-27 DIAGNOSIS — R0902 Hypoxemia: Secondary | ICD-10-CM | POA: Diagnosis not present

## 2020-11-27 DIAGNOSIS — E559 Vitamin D deficiency, unspecified: Secondary | ICD-10-CM | POA: Diagnosis not present

## 2020-11-27 DIAGNOSIS — A499 Bacterial infection, unspecified: Secondary | ICD-10-CM | POA: Diagnosis not present

## 2020-11-27 DIAGNOSIS — F419 Anxiety disorder, unspecified: Secondary | ICD-10-CM | POA: Diagnosis not present

## 2020-11-27 DIAGNOSIS — M545 Low back pain, unspecified: Secondary | ICD-10-CM | POA: Diagnosis not present

## 2020-11-27 DIAGNOSIS — R2689 Other abnormalities of gait and mobility: Secondary | ICD-10-CM | POA: Diagnosis not present

## 2020-11-27 DIAGNOSIS — J1282 Pneumonia due to coronavirus disease 2019: Secondary | ICD-10-CM | POA: Diagnosis not present

## 2020-11-27 DIAGNOSIS — J189 Pneumonia, unspecified organism: Secondary | ICD-10-CM | POA: Diagnosis not present

## 2020-11-27 DIAGNOSIS — N390 Urinary tract infection, site not specified: Secondary | ICD-10-CM | POA: Diagnosis not present

## 2020-11-27 DIAGNOSIS — M2550 Pain in unspecified joint: Secondary | ICD-10-CM | POA: Diagnosis not present

## 2020-11-27 DIAGNOSIS — D519 Vitamin B12 deficiency anemia, unspecified: Secondary | ICD-10-CM | POA: Diagnosis not present

## 2020-11-27 DIAGNOSIS — K219 Gastro-esophageal reflux disease without esophagitis: Secondary | ICD-10-CM | POA: Diagnosis not present

## 2020-11-27 DIAGNOSIS — Z7401 Bed confinement status: Secondary | ICD-10-CM | POA: Diagnosis not present

## 2020-11-27 DIAGNOSIS — S32019A Unspecified fracture of first lumbar vertebra, initial encounter for closed fracture: Secondary | ICD-10-CM | POA: Diagnosis not present

## 2020-11-27 DIAGNOSIS — R2681 Unsteadiness on feet: Secondary | ICD-10-CM | POA: Diagnosis not present

## 2020-11-27 DIAGNOSIS — R918 Other nonspecific abnormal finding of lung field: Secondary | ICD-10-CM | POA: Diagnosis not present

## 2020-11-27 DIAGNOSIS — W19XXXS Unspecified fall, sequela: Secondary | ICD-10-CM | POA: Diagnosis not present

## 2020-11-27 DIAGNOSIS — R5381 Other malaise: Secondary | ICD-10-CM | POA: Diagnosis not present

## 2020-11-28 DIAGNOSIS — E785 Hyperlipidemia, unspecified: Secondary | ICD-10-CM | POA: Diagnosis not present

## 2020-11-28 DIAGNOSIS — M545 Low back pain, unspecified: Secondary | ICD-10-CM | POA: Diagnosis not present

## 2020-11-28 DIAGNOSIS — I10 Essential (primary) hypertension: Secondary | ICD-10-CM | POA: Diagnosis not present

## 2020-11-28 DIAGNOSIS — F419 Anxiety disorder, unspecified: Secondary | ICD-10-CM | POA: Diagnosis not present

## 2020-11-28 DIAGNOSIS — D519 Vitamin B12 deficiency anemia, unspecified: Secondary | ICD-10-CM | POA: Diagnosis not present

## 2020-11-28 DIAGNOSIS — K219 Gastro-esophageal reflux disease without esophagitis: Secondary | ICD-10-CM | POA: Diagnosis not present

## 2020-11-28 DIAGNOSIS — E559 Vitamin D deficiency, unspecified: Secondary | ICD-10-CM | POA: Diagnosis not present

## 2020-11-29 DIAGNOSIS — E781 Pure hyperglyceridemia: Secondary | ICD-10-CM | POA: Diagnosis not present

## 2020-11-29 DIAGNOSIS — K219 Gastro-esophageal reflux disease without esophagitis: Secondary | ICD-10-CM | POA: Diagnosis not present

## 2020-11-29 DIAGNOSIS — F419 Anxiety disorder, unspecified: Secondary | ICD-10-CM | POA: Diagnosis not present

## 2020-12-03 DIAGNOSIS — R339 Retention of urine, unspecified: Secondary | ICD-10-CM | POA: Diagnosis not present

## 2020-12-05 DIAGNOSIS — S32019A Unspecified fracture of first lumbar vertebra, initial encounter for closed fracture: Secondary | ICD-10-CM | POA: Diagnosis not present

## 2020-12-05 DIAGNOSIS — R5381 Other malaise: Secondary | ICD-10-CM | POA: Diagnosis not present

## 2020-12-05 DIAGNOSIS — N390 Urinary tract infection, site not specified: Secondary | ICD-10-CM | POA: Diagnosis not present

## 2020-12-05 DIAGNOSIS — E559 Vitamin D deficiency, unspecified: Secondary | ICD-10-CM | POA: Diagnosis not present

## 2020-12-05 DIAGNOSIS — J1282 Pneumonia due to coronavirus disease 2019: Secondary | ICD-10-CM | POA: Diagnosis not present

## 2020-12-07 DIAGNOSIS — A499 Bacterial infection, unspecified: Secondary | ICD-10-CM | POA: Diagnosis not present

## 2020-12-07 DIAGNOSIS — N390 Urinary tract infection, site not specified: Secondary | ICD-10-CM | POA: Diagnosis not present

## 2020-12-10 DIAGNOSIS — B952 Enterococcus as the cause of diseases classified elsewhere: Secondary | ICD-10-CM | POA: Diagnosis not present

## 2020-12-10 DIAGNOSIS — N390 Urinary tract infection, site not specified: Secondary | ICD-10-CM | POA: Diagnosis not present

## 2020-12-12 DIAGNOSIS — R918 Other nonspecific abnormal finding of lung field: Secondary | ICD-10-CM | POA: Diagnosis not present

## 2020-12-14 DIAGNOSIS — K5900 Constipation, unspecified: Secondary | ICD-10-CM | POA: Diagnosis not present

## 2020-12-14 DIAGNOSIS — N390 Urinary tract infection, site not specified: Secondary | ICD-10-CM | POA: Diagnosis not present

## 2020-12-17 DIAGNOSIS — K219 Gastro-esophageal reflux disease without esophagitis: Secondary | ICD-10-CM | POA: Diagnosis not present

## 2020-12-17 DIAGNOSIS — I10 Essential (primary) hypertension: Secondary | ICD-10-CM | POA: Diagnosis not present

## 2020-12-17 DIAGNOSIS — F419 Anxiety disorder, unspecified: Secondary | ICD-10-CM | POA: Diagnosis not present

## 2020-12-18 DIAGNOSIS — E781 Pure hyperglyceridemia: Secondary | ICD-10-CM | POA: Diagnosis not present

## 2020-12-18 DIAGNOSIS — K5900 Constipation, unspecified: Secondary | ICD-10-CM | POA: Diagnosis not present

## 2020-12-18 DIAGNOSIS — M545 Low back pain, unspecified: Secondary | ICD-10-CM | POA: Diagnosis not present

## 2020-12-18 DIAGNOSIS — S32019D Unspecified fracture of first lumbar vertebra, subsequent encounter for fracture with routine healing: Secondary | ICD-10-CM | POA: Diagnosis not present

## 2020-12-19 ENCOUNTER — Other Ambulatory Visit: Payer: Self-pay | Admitting: Gastroenterology

## 2020-12-21 DIAGNOSIS — I10 Essential (primary) hypertension: Secondary | ICD-10-CM | POA: Diagnosis not present

## 2020-12-21 DIAGNOSIS — T1490XA Injury, unspecified, initial encounter: Secondary | ICD-10-CM | POA: Diagnosis not present

## 2020-12-21 DIAGNOSIS — W19XXXA Unspecified fall, initial encounter: Secondary | ICD-10-CM | POA: Diagnosis not present

## 2020-12-21 DIAGNOSIS — S32009G Unspecified fracture of unspecified lumbar vertebra, subsequent encounter for fracture with delayed healing: Secondary | ICD-10-CM | POA: Diagnosis not present

## 2021-01-03 DIAGNOSIS — K219 Gastro-esophageal reflux disease without esophagitis: Secondary | ICD-10-CM | POA: Diagnosis not present

## 2021-01-03 DIAGNOSIS — J1282 Pneumonia due to coronavirus disease 2019: Secondary | ICD-10-CM | POA: Diagnosis not present

## 2021-01-03 DIAGNOSIS — F419 Anxiety disorder, unspecified: Secondary | ICD-10-CM | POA: Diagnosis not present

## 2021-01-03 DIAGNOSIS — R339 Retention of urine, unspecified: Secondary | ICD-10-CM | POA: Diagnosis not present

## 2021-01-03 DIAGNOSIS — K222 Esophageal obstruction: Secondary | ICD-10-CM | POA: Diagnosis not present

## 2021-01-03 DIAGNOSIS — U071 COVID-19: Secondary | ICD-10-CM | POA: Diagnosis not present

## 2021-01-03 DIAGNOSIS — I712 Thoracic aortic aneurysm, without rupture: Secondary | ICD-10-CM | POA: Diagnosis not present

## 2021-01-03 DIAGNOSIS — Z9181 History of falling: Secondary | ICD-10-CM | POA: Diagnosis not present

## 2021-01-03 DIAGNOSIS — S32019D Unspecified fracture of first lumbar vertebra, subsequent encounter for fracture with routine healing: Secondary | ICD-10-CM | POA: Diagnosis not present

## 2021-01-03 DIAGNOSIS — I10 Essential (primary) hypertension: Secondary | ICD-10-CM | POA: Diagnosis not present

## 2021-01-07 DIAGNOSIS — E559 Vitamin D deficiency, unspecified: Secondary | ICD-10-CM | POA: Diagnosis not present

## 2021-01-07 DIAGNOSIS — I10 Essential (primary) hypertension: Secondary | ICD-10-CM | POA: Diagnosis not present

## 2021-01-07 DIAGNOSIS — I7101 Dissection of thoracic aorta: Secondary | ICD-10-CM | POA: Diagnosis not present

## 2021-01-07 DIAGNOSIS — T1490XA Injury, unspecified, initial encounter: Secondary | ICD-10-CM | POA: Diagnosis not present

## 2021-01-07 DIAGNOSIS — F411 Generalized anxiety disorder: Secondary | ICD-10-CM | POA: Diagnosis not present

## 2021-01-07 DIAGNOSIS — R7301 Impaired fasting glucose: Secondary | ICD-10-CM | POA: Diagnosis not present

## 2021-01-07 DIAGNOSIS — M5451 Vertebrogenic low back pain: Secondary | ICD-10-CM | POA: Diagnosis not present

## 2021-01-07 DIAGNOSIS — K5730 Diverticulosis of large intestine without perforation or abscess without bleeding: Secondary | ICD-10-CM | POA: Diagnosis not present

## 2021-01-07 DIAGNOSIS — I712 Thoracic aortic aneurysm, without rupture: Secondary | ICD-10-CM | POA: Diagnosis not present

## 2021-01-07 DIAGNOSIS — S32009G Unspecified fracture of unspecified lumbar vertebra, subsequent encounter for fracture with delayed healing: Secondary | ICD-10-CM | POA: Diagnosis not present

## 2021-01-07 DIAGNOSIS — S3992XA Unspecified injury of lower back, initial encounter: Secondary | ICD-10-CM | POA: Diagnosis not present

## 2021-01-09 DIAGNOSIS — I712 Thoracic aortic aneurysm, without rupture: Secondary | ICD-10-CM | POA: Diagnosis not present

## 2021-01-09 DIAGNOSIS — R339 Retention of urine, unspecified: Secondary | ICD-10-CM | POA: Diagnosis not present

## 2021-01-09 DIAGNOSIS — F419 Anxiety disorder, unspecified: Secondary | ICD-10-CM | POA: Diagnosis not present

## 2021-01-09 DIAGNOSIS — K219 Gastro-esophageal reflux disease without esophagitis: Secondary | ICD-10-CM | POA: Diagnosis not present

## 2021-01-09 DIAGNOSIS — U071 COVID-19: Secondary | ICD-10-CM | POA: Diagnosis not present

## 2021-01-09 DIAGNOSIS — Z9181 History of falling: Secondary | ICD-10-CM | POA: Diagnosis not present

## 2021-01-09 DIAGNOSIS — I10 Essential (primary) hypertension: Secondary | ICD-10-CM | POA: Diagnosis not present

## 2021-01-09 DIAGNOSIS — S32019D Unspecified fracture of first lumbar vertebra, subsequent encounter for fracture with routine healing: Secondary | ICD-10-CM | POA: Diagnosis not present

## 2021-01-09 DIAGNOSIS — J1282 Pneumonia due to coronavirus disease 2019: Secondary | ICD-10-CM | POA: Diagnosis not present

## 2021-01-09 DIAGNOSIS — K222 Esophageal obstruction: Secondary | ICD-10-CM | POA: Diagnosis not present

## 2021-01-13 DIAGNOSIS — S32009G Unspecified fracture of unspecified lumbar vertebra, subsequent encounter for fracture with delayed healing: Secondary | ICD-10-CM | POA: Diagnosis not present

## 2021-01-13 DIAGNOSIS — W19XXXA Unspecified fall, initial encounter: Secondary | ICD-10-CM | POA: Diagnosis not present

## 2021-01-13 DIAGNOSIS — T1490XA Injury, unspecified, initial encounter: Secondary | ICD-10-CM | POA: Diagnosis not present

## 2021-01-13 DIAGNOSIS — I10 Essential (primary) hypertension: Secondary | ICD-10-CM | POA: Diagnosis not present

## 2021-01-17 DIAGNOSIS — F419 Anxiety disorder, unspecified: Secondary | ICD-10-CM | POA: Diagnosis not present

## 2021-01-17 DIAGNOSIS — J1282 Pneumonia due to coronavirus disease 2019: Secondary | ICD-10-CM | POA: Diagnosis not present

## 2021-01-17 DIAGNOSIS — K219 Gastro-esophageal reflux disease without esophagitis: Secondary | ICD-10-CM | POA: Diagnosis not present

## 2021-01-17 DIAGNOSIS — Z9181 History of falling: Secondary | ICD-10-CM | POA: Diagnosis not present

## 2021-01-17 DIAGNOSIS — S32019D Unspecified fracture of first lumbar vertebra, subsequent encounter for fracture with routine healing: Secondary | ICD-10-CM | POA: Diagnosis not present

## 2021-01-17 DIAGNOSIS — U071 COVID-19: Secondary | ICD-10-CM | POA: Diagnosis not present

## 2021-01-17 DIAGNOSIS — I712 Thoracic aortic aneurysm, without rupture: Secondary | ICD-10-CM | POA: Diagnosis not present

## 2021-01-17 DIAGNOSIS — R339 Retention of urine, unspecified: Secondary | ICD-10-CM | POA: Diagnosis not present

## 2021-01-17 DIAGNOSIS — K222 Esophageal obstruction: Secondary | ICD-10-CM | POA: Diagnosis not present

## 2021-01-17 DIAGNOSIS — I10 Essential (primary) hypertension: Secondary | ICD-10-CM | POA: Diagnosis not present

## 2021-06-28 ENCOUNTER — Other Ambulatory Visit: Payer: Self-pay | Admitting: Gastroenterology

## 2022-03-31 ENCOUNTER — Other Ambulatory Visit: Payer: Self-pay | Admitting: Family Medicine

## 2022-03-31 DIAGNOSIS — Z8249 Family history of ischemic heart disease and other diseases of the circulatory system: Secondary | ICD-10-CM

## 2022-05-02 ENCOUNTER — Other Ambulatory Visit: Payer: PPO

## 2022-08-15 ENCOUNTER — Other Ambulatory Visit: Payer: Self-pay | Admitting: Gastroenterology

## 2023-04-29 DIAGNOSIS — I7123 Aneurysm of the descending thoracic aorta, without rupture: Secondary | ICD-10-CM

## 2023-04-29 HISTORY — DX: Aneurysm of the descending thoracic aorta, without rupture: I71.23

## 2023-07-27 ENCOUNTER — Telehealth: Payer: Self-pay

## 2023-07-27 ENCOUNTER — Other Ambulatory Visit: Payer: Self-pay | Admitting: Gastroenterology

## 2023-07-27 NOTE — Telephone Encounter (Signed)
Good morning Dr Russella Dar,  Please advise if OK to refill as you are DOD am.  This is a patient of Dr Christella Hartigan.  Thank you

## 2023-07-27 NOTE — Telephone Encounter (Addendum)
Patient aware that Dr Russella Dar has given OK to refill for 90 days without any refills. Patient instructed that since she has not been seen in our office in over 3 years he will need to obtain additional refills from her PCP or schedule an office appt with one of our APPs or physicians.  Patient stated that she will obtain future refills from her PCP.

## 2023-09-21 ENCOUNTER — Other Ambulatory Visit: Payer: Self-pay | Admitting: Gastroenterology

## 2023-11-18 ENCOUNTER — Other Ambulatory Visit: Payer: Self-pay | Admitting: Gastroenterology

## 2023-11-30 ENCOUNTER — Other Ambulatory Visit: Payer: Self-pay | Admitting: Gastroenterology

## 2024-02-05 ENCOUNTER — Encounter: Payer: Self-pay | Admitting: Pediatrics

## 2024-03-04 ENCOUNTER — Ambulatory Visit: Payer: PPO | Admitting: Pediatrics

## 2024-07-15 LAB — COLOGUARD: COLOGUARD: NEGATIVE

## 2024-11-15 ENCOUNTER — Encounter: Payer: Self-pay | Admitting: Ophthalmology

## 2024-11-15 NOTE — Discharge Instructions (Signed)

## 2024-11-15 NOTE — Anesthesia Preprocedure Evaluation (Addendum)
 Anesthesia Evaluation  Patient identified by MRN, date of birth, ID band Patient awake    Reviewed: Allergy & Precautions, H&P , NPO status , Patient's Chart, lab work & pertinent test results  Airway Mallampati: IV  TM Distance: <3 FB Neck ROM: Full    Dental no notable dental hx. (+) Caps Cap or crown right upper central incisor :   Pulmonary neg pulmonary ROS   Pulmonary exam normal breath sounds clear to auscultation       Cardiovascular hypertension, negative cardio ROS Normal cardiovascular exam Rhythm:Regular Rate:Normal  10-08-19  1. Left ventricular ejection fraction, by visual estimation, is 60 to  65%. The left ventricle has normal function. Normal left ventricular size.  There is mildly increased left ventricular hypertrophy.   2. Left ventricular diastolic Doppler parameters are consistent with  impaired relaxation pattern of LV diastolic filling.   3. Global right ventricle has normal systolic function.The right  ventricular size is normal. No increase in right ventricular wall  thickness.   4. Left atrial size was normal.   5. Right atrial size was normal.   6. The mitral valve is normal in structure. No evidence of mitral valve  regurgitation. No evidence of mitral stenosis.   7. The tricuspid valve is normal in structure. Tricuspid valve  regurgitation is mild.   8. The aortic valve is normal in structure. Aortic valve regurgitation  was not visualized by color flow Doppler. Structurally normal aortic  valve, with no evidence of sclerosis or stenosis.   9. The pulmonic valve was normal in structure. Pulmonic valve  regurgitation is not visualized by color flow Doppler.  10. Normal pulmonary artery systolic pressure.  11. The inferior vena cava is normal in size with greater than 50%  respiratory variability, suggesting right atrial pressure of 3 mmHg.   06-17-24 1. Successful endovascular repair of  aneurysmal dissection of the  descending thoracic aorta. The left subclavian snorkel stent graft  remains widely patent. The excluded aneurysm sac is decreasing in  size and now measures up to 4.8 cm compared to 5.7 cm previously. No  evidence of endoleak or other complication.  2. Scattered atherosclerotic plaque throughout the abdominal aorta  without evidence of aneurysm or dissection.  3. No acute abnormality within the chest, abdomen or pelvis.  4. Coronary artery atherosclerotic vascular calcifications.  5. Mild cardiomegaly.  6. Colonic diverticular disease without CT evidence of active  inflammation.  7. Stable chronic L1 compression fracture with 60% height loss.   06-17-24 office note: Doing very well 1 year status post thoracic branch endoprosthesis as well as left brachial artery bypass. CT scan from today shows good seal of aortic endograft with a patent stent in the left clavian artery. I do not see any endoleaks. Her brachial artery bypass is widely patent on duplex today and she is a palpable left radial pulse. -I will plan to see her back in 1 year with repeat CTA to evaluate her endograft.     Neuro/Psych  Neuromuscular disease negative neurological ROS  negative psych ROS   GI/Hepatic negative GI ROS, Neg liver ROS, hiatal hernia,GERD  ,,  Endo/Other  negative endocrine ROSHypothyroidism    Renal/GU negative Renal ROS  negative genitourinary   Musculoskeletal negative musculoskeletal ROS (+) Arthritis ,  Fibromyalgia -  Abdominal   Peds negative pediatric ROS (+)  Hematology negative hematology ROS (+)   Anesthesia Other Findings BRACHIAL EMBOLECTOMY Left 05/22/2023  (E2) REPAIR BRACHIAL ARTERY, LEFT LEG SAPHENOUS VEIN  HARVEST performed by Donnice Belvie Fear, MD at [facility] OR  EMBOLIZATION Left 05/22/2023  EMBOLIZATION ARTERY WITH STENT OR COIL performed by Donnice Belvie Fear, MD at [facility] OR  ESOPHAGEAL DILATION  most recent ~ 2021   ESOPHAGOGASTRODUODENOSCOPY N/A  Procedure: ESOPHAGOGASTRODUODENOSCOPY; stretched my esophagus  THORACIC AORTIC ANEURYSM REPAIR N/A 05/22/2023  REPAIR ANEURYSM ENDOVASCULAR DESCENDING THORACIC WITH THORACIC BRANCH DEVICE performed by Donnice Belvie Fear, MD at [facility] OR   Medical History  Fibromyalgia  Seasonal allergies Arthritis  GERD (gastroesophageal reflux disease) Diverticulosis  Hiatal hernia Esophageal stricture  Hypertension Allergy  Fibromyalgia Hypothyroidism  Aneurysm of descending thoracic aorta without rupture Restless leg syndrome  Mild tricuspid regurgitation by prior echocardiogram  Is wearing a wig and is worried it will get pulled off, so will be very cautious with wig    Reproductive/Obstetrics negative OB ROS                              Anesthesia Physical Anesthesia Plan  ASA: 3  Anesthesia Plan: MAC   Post-op Pain Management:    Induction: Intravenous  PONV Risk Score and Plan:   Airway Management Planned: Natural Airway and Nasal Cannula  Additional Equipment:   Intra-op Plan:   Post-operative Plan:   Informed Consent: I have reviewed the patients History and Physical, chart, labs and discussed the procedure including the risks, benefits and alternatives for the proposed anesthesia with the patient or authorized representative who has indicated his/her understanding and acceptance.     Dental Advisory Given  Plan Discussed with: Anesthesiologist, CRNA and Surgeon  Anesthesia Plan Comments: (Patient consented for risks of anesthesia including but not limited to:  - adverse reactions to medications - damage to eyes, teeth, lips or other oral mucosa - nerve damage due to positioning  - sore throat or hoarseness - Damage to heart, brain, nerves, lungs, other parts of body or loss of life  Patient voiced understanding and assent.)         Anesthesia Quick Evaluation

## 2024-11-16 ENCOUNTER — Ambulatory Visit: Payer: Self-pay | Admitting: Anesthesiology

## 2024-11-16 ENCOUNTER — Ambulatory Visit
Admission: RE | Admit: 2024-11-16 | Discharge: 2024-11-16 | Disposition: A | Discharge status: Home / Self Care | Attending: Ophthalmology | Admitting: Ophthalmology

## 2024-11-16 ENCOUNTER — Encounter
Admission: RE | Disposition: A | Discharge status: Home / Self Care | Payer: Self-pay | Source: Home / Self Care | Attending: Ophthalmology

## 2024-11-16 ENCOUNTER — Encounter: Payer: Self-pay | Admitting: Ophthalmology

## 2024-11-16 ENCOUNTER — Other Ambulatory Visit: Payer: Self-pay

## 2024-11-16 ENCOUNTER — Encounter: Payer: Self-pay | Admitting: Anesthesiology

## 2024-11-16 DIAGNOSIS — H2512 Age-related nuclear cataract, left eye: Secondary | ICD-10-CM | POA: Diagnosis present

## 2024-11-16 DIAGNOSIS — H2511 Age-related nuclear cataract, right eye: Secondary | ICD-10-CM | POA: Diagnosis not present

## 2024-11-16 DIAGNOSIS — I10 Essential (primary) hypertension: Secondary | ICD-10-CM | POA: Diagnosis not present

## 2024-11-16 DIAGNOSIS — K219 Gastro-esophageal reflux disease without esophagitis: Secondary | ICD-10-CM | POA: Diagnosis not present

## 2024-11-16 HISTORY — DX: Hypothyroidism, unspecified: E03.9

## 2024-11-16 HISTORY — PX: CATARACT EXTRACTION W/PHACO: SHX586

## 2024-11-16 HISTORY — DX: Restless legs syndrome: G25.81

## 2024-11-16 HISTORY — DX: Rheumatic tricuspid insufficiency: I07.1

## 2024-11-16 SURGERY — PHACOEMULSIFICATION, CATARACT, WITH IOL INSERTION
Anesthesia: Monitor Anesthesia Care | Site: Eye | Laterality: Right

## 2024-11-16 MED ORDER — CEFUROXIME OPHTHALMIC INJECTION 1 MG/0.1 ML
INJECTION | OPHTHALMIC | Status: DC | PRN
  Administered 2024-11-16: 1 mg via INTRACAMERAL

## 2024-11-16 MED ORDER — FENTANYL CITRATE (PF) 100 MCG/2ML IJ SOLN
INTRAMUSCULAR | Status: DC | PRN
  Administered 2024-11-16: 50 ug via INTRAVENOUS

## 2024-11-16 MED ORDER — MIDAZOLAM HCL 2 MG/2ML IJ SOLN
INTRAMUSCULAR | Status: AC
  Filled 2024-11-16: qty 2

## 2024-11-16 MED ORDER — LACTATED RINGERS IV SOLN: INTRAVENOUS | Status: DC

## 2024-11-16 MED ORDER — BRIMONIDINE TARTRATE-TIMOLOL 0.2-0.5 % OP SOLN
OPHTHALMIC | Status: DC | PRN
  Administered 2024-11-16: 1 [drp] via OPHTHALMIC

## 2024-11-16 MED ORDER — LIDOCAINE HCL (PF) 2 % IJ SOLN
INTRAOCULAR | Status: DC | PRN
  Administered 2024-11-16: 2 mL

## 2024-11-16 MED ORDER — SIGHTPATH DOSE#1 NA HYALUR & NA CHOND-NA HYALUR IO KIT
PACK | INTRAOCULAR | Status: DC | PRN
  Administered 2024-11-16: 1 via OPHTHALMIC

## 2024-11-16 MED ORDER — SIGHTPATH DOSE#1 BSS IO SOLN
INTRAOCULAR | Status: DC | PRN
  Administered 2024-11-16: 15 mL via INTRAOCULAR

## 2024-11-16 MED ORDER — MIDAZOLAM HCL (PF) 2 MG/2ML IJ SOLN
INTRAMUSCULAR | Status: DC | PRN
  Administered 2024-11-16: 1 mg via INTRAVENOUS

## 2024-11-16 MED ORDER — FENTANYL CITRATE (PF) 100 MCG/2ML IJ SOLN
INTRAMUSCULAR | Status: AC
  Filled 2024-11-16: qty 2

## 2024-11-16 MED ORDER — CYCLOPENTOLATE HCL 2 % OP SOLN
OPHTHALMIC | Status: AC
  Filled 2024-11-16: qty 2

## 2024-11-16 MED ORDER — TETRACAINE HCL 0.5 % OP SOLN
1.0000 [drp] | OPHTHALMIC | Status: DC | PRN
  Administered 2024-11-16 (×3): 1 [drp] via OPHTHALMIC

## 2024-11-16 MED ORDER — TETRACAINE HCL 0.5 % OP SOLN
OPHTHALMIC | Status: AC
  Filled 2024-11-16: qty 4

## 2024-11-16 MED ORDER — PHENYLEPHRINE HCL 10 % OP SOLN
OPHTHALMIC | Status: AC
  Filled 2024-11-16: qty 5

## 2024-11-16 MED ORDER — CYCLOPENTOLATE HCL 2 % OP SOLN
1.0000 [drp] | OPHTHALMIC | Status: AC
  Administered 2024-11-16 (×2): 1 [drp] via OPHTHALMIC

## 2024-11-16 MED ORDER — PHENYLEPHRINE HCL 10 % OP SOLN
1.0000 [drp] | OPHTHALMIC | Status: AC
  Administered 2024-11-16 (×3): 1 [drp] via OPHTHALMIC

## 2024-11-16 MED ORDER — SIGHTPATH DOSE#1 BSS IO SOLN
INTRAOCULAR | Status: DC | PRN
  Administered 2024-11-16: 66 mL via OPHTHALMIC

## 2024-11-16 SURGICAL SUPPLY — 8 items
FEE CATARACT SUITE SIGHTPATH (MISCELLANEOUS) ×1 IMPLANT
GLOVE BIOGEL PI IND STRL 8 (GLOVE) ×1 IMPLANT
GLOVE SURG LX STRL 7.5 STRW (GLOVE) ×1 IMPLANT
GLOVE SURG SYN 6.5 PF PI BL (GLOVE) ×1 IMPLANT
LENS IOL TECNIS EYHANCE 25.5 (Intraocular Lens) IMPLANT
NDL FILTER BLUNT 18X1 1/2 (NEEDLE) ×1 IMPLANT
NEEDLE FILTER BLUNT 18X1 1/2 (NEEDLE) ×1 IMPLANT
SYR 3ML LL SCALE MARK (SYRINGE) ×1 IMPLANT

## 2024-11-16 NOTE — Op Note (Signed)
 LOCATION:  [facility]   PREOPERATIVE DIAGNOSIS:    Nuclear sclerotic cataract right eye. H25.11   POSTOPERATIVE DIAGNOSIS:  Nuclear sclerotic cataract right eye.     PROCEDURE:  Phacoemusification with posterior chamber intraocular lens placement of the right eye   ULTRASOUND TIME: Procedure(s): PHACOEMULSIFICATION, CATARACT, WITH IOL INSERTION 3.22 00:24.3 (Right)  LENS:   Implant Name Type Inv. Item Serial No. Manufacturer Lot No. LRB No. Used Action  LENS IOL TECNIS EYHANCE 25.5 - [id number] Intraocular Lens LENS IOL TECNIS EYHANCE 25.5 [id number] [employer]  Right 1 Implanted         SURGEON:  Dene FABIENE Etienne, MD   ANESTHESIA:  Topical with tetracaine  drops and 2% Xylocaine jelly, augmented with 1% preservative-free intracameral lidocaine.    COMPLICATIONS:  None.   DESCRIPTION OF PROCEDURE:  The patient was identified in the holding room and transported to the operating room and placed in the supine position under the operating microscope.  The right eye was identified as the operative eye and it was prepped and draped in the usual sterile ophthalmic fashion.   A 1 millimeter clear-corneal paracentesis was made at the 12:00 position.  0.5 ml of preservative-free 1% lidocaine was injected into the anterior chamber. The anterior chamber was filled with Viscoat viscoelastic.  A 2.4 millimeter keratome was used to make a near-clear corneal incision at the 9:00 position.  A curvilinear capsulorrhexis was made with a cystotome and capsulorrhexis forceps.  Balanced salt solution was used to hydrodissect and hydrodelineate the nucleus.   Phacoemulsification was then used in stop and chop fashion to remove the lens nucleus and epinucleus.  The remaining cortex was then removed using the irrigation and aspiration handpiece. Provisc was then placed into the capsular bag to distend it for lens placement.  A lens was then injected into the capsular bag.  The remaining  viscoelastic was aspirated.   Wounds were hydrated with balanced salt solution.  The anterior chamber was inflated to a physiologic pressure with balanced salt solution.  No wound leaks were noted. Cefuroxime 0.1 ml of a 10mg /ml solution was injected into the anterior chamber for a dose of 1 mg of intracameral antibiotic at the completion of the case.   Timolol and Brimonidine drops were applied to the eye.  The patient was taken to the recovery room in stable condition without complications of anesthesia or surgery.   Tamalyn Wadsworth 11/16/2024, 9:27 AM

## 2024-11-16 NOTE — H&P (Signed)
 [facility]   Primary Care Physician:  Burney Darice CROME, MD Ophthalmologist: Dr. Dene Etienne  Pre-Procedure History & Physical: HPI:  Melanie Nash is a 78 y.o. female here for ophthalmic surgery.   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 Postoperatively, had acute left arm ischemia requiring left brachial artery bypass   Arthritis    Diverticulosis    Esophageal stricture    Fibromyalgia    Fibromyalgia    GERD (gastroesophageal reflux disease)    Hiatal hernia    Hypertension    Hypothyroidism    Mild tricuspid regurgitation by prior echocardiogram    Restless leg syndrome    Seasonal allergies    sinus infection    Past Surgical History:  Procedure Laterality Date   BRACHIAL ARTERY GRAFT Left    [id number]   COLONOSCOPY     ESOPHAGOGASTRODUODENOSCOPY     dysphagia   ESOPHAGOGASTRODUODENOSCOPY N/A 02/26/2016   Procedure: ESOPHAGOGASTRODUODENOSCOPY (EGD);  Surgeon: Toribio SHAUNNA Cedar, MD;  Location: [facility] ENDOSCOPY;  Service: Endoscopy;  Laterality: N/A;   ESOPHAGOGASTRODUODENOSCOPY Left 01/02/2020   Procedure: ESOPHAGOGASTRODUODENOSCOPY (EGD);  Surgeon: Leigh Elspeth SHAUNNA, MD;  Location: [facility] ENDOSCOPY;  Service: Gastroenterology;  Laterality: Left;   FOREIGN BODY REMOVAL  01/02/2020   Procedure: FOREIGN BODY REMOVAL;  Surgeon: Leigh Elspeth SHAUNNA, MD;  Location: [facility] ENDOSCOPY;  Service: Gastroenterology;;   GRAFT DESCENDING THORACIC AORTA N/A    04/2023   SAPHENOUS VEIN GRAFT RESECTION Left    04/2023    Prior to Admission medications   Medication Sig Start Date End Date Taking? Authorizing Provider  Ascorbic Acid (VITAMIN C PO) Take 5 mLs by mouth 3 (three) times daily.   Yes [provider]  Ascorbic Acid (VITAMIN C) 500 MG/5ML LIQD Take 5 mLs by mouth 3 (three) times daily.   Yes [provider]  carvedilol  (COREG ) 12.5 MG tablet Take 1 tablet (12.5 mg total) by mouth 2 (two) times daily with a meal. 10/14/19  Yes Gonfa, Taye T, MD  Cholecalciferol (VITAMIN D3) 50 MCG (2000 UT) capsule Take 2,000 Units by mouth daily.   Yes [provider]  cloNIDine (CATAPRES) 0.1 MG tablet Take 0.1 mg by mouth daily.   Yes [provider]  Homeopathic Products (ZICAM ALLERGY RELIEF NA) Place 1 tablet into the nose daily.   Yes [provider]  levothyroxine (SYNTHROID) 25 MCG tablet Take 25 mcg by mouth daily before breakfast.   Yes [provider]  loratadine  (CLARITIN ) 10 MG tablet Take 10 mg by mouth daily.   Yes [provider]  losartan-hydrochlorothiazide (HYZAAR) 100-12.5 MG tablet Take 1 tablet by mouth daily.   Yes [provider]  Magnesium 300 MG TABS Take 325 mg by mouth daily.   Yes [provider]  Melatonin 10 MG TABS Take 1 tablet by mouth at bedtime.   Yes [provider]  montelukast (SINGULAIR) 10 MG tablet Take 10 mg by mouth at bedtime.   Yes [provider]  Multiple Vitamins-Minerals (VITAFUSION MULTI WOMENS PO) Take 1 tablet by mouth daily.   Yes [provider]  NON FORMULARY Take 1 tablet by mouth daily. RESPIRATORY SUPPORT AND DEFENSE   Yes [provider]  rOPINIRole (REQUIP) 1 MG tablet Take 1 mg by mouth daily.   Yes [provider]  rosuvastatin (CRESTOR)  10 MG tablet Take 10 mg by mouth daily.   Yes [provider]  SAMBUCOL BLACK ELDERBERRY PO Take 1.7 g by mouth daily.   Yes [provider]  solifenacin (VESICARE) 5 MG tablet Take 5 mg by mouth daily.   Yes [provider]  omeprazole  (PRILOSEC) 40 MG capsule TAKE 1 CAPSULE BY MOUTH EVERY DAY Patient taking differently: Take 20 mg by mouth daily. 07/27/23   Aneita Gwendlyn DASEN, MD    Allergies as of 10/27/2024   (No Known Allergies)    Family History  Problem Relation  Age of Onset   Heart disease Mother    Diabetes Sister    Diabetes Maternal Uncle    AAA (abdominal aortic aneurysm) Father    Colon cancer Neg Hx    Stomach cancer Neg Hx     Social History   Socioeconomic History   Marital status: Married    Spouse name: Not on file   Number of children: 2   Years of education: Not on file   Highest education level: Not on file  Occupational History   Occupation: caterer  Tobacco Use   Smoking status: Never   Smokeless tobacco: Never  Vaping Use   Vaping status: Never Used  Substance and Sexual Activity   Alcohol use: No    Alcohol/week: 0.0 standard drinks of alcohol   Drug use: No   Sexual activity: Not on file  Other Topics Concern   Not on file  Social History Narrative   Not on file   Social Drivers of Health   Financial Resource Strain: Not on file  Food Insecurity: Not on file  Transportation Needs: Not on file  Physical Activity: Not on file  Stress: Not on file  Social Connections: Not on file  Intimate Partner Violence: Not on file    Review of Systems: See HPI, otherwise negative ROS  Physical Exam: BP (!) 159/90   Pulse 70   Temp 97.9 F (36.6 C) (Temporal)   Resp 20   Ht 5' 2 (1.575 m)   Wt 83.3 kg   SpO2 96%   BMI 33.58 kg/m  General:   Alert,  pleasant and cooperative in NAD Head:  Normocephalic and atraumatic. Lungs:  Clear to auscultation.    Heart:  Regular rate and rhythm.   Impression/Plan: Melanie Nash is here for ophthalmic surgery.  Risks, benefits, limitations, and alternatives regarding ophthalmic surgery have been reviewed with the patient.  Questions have been answered.  All parties agreeable.   Melanie GASKIN, MD  11/16/2024, 8:47 AM

## 2024-11-16 NOTE — Anesthesia Postprocedure Evaluation (Signed)
 Anesthesia Post Note  Patient: [name]  Procedure(s) Performed: PHACOEMULSIFICATION, CATARACT, WITH IOL INSERTION 3.22 00:24.3 (Right: Eye)  Patient location during evaluation: PACU Anesthesia Type: MAC Level of consciousness: awake and alert Pain management: pain level controlled Vital Signs Assessment: post-procedure vital signs reviewed and stable Respiratory status: spontaneous breathing, nonlabored ventilation, respiratory function stable and patient connected to nasal cannula oxygen Cardiovascular status: stable and blood pressure returned to baseline Postop Assessment: no apparent nausea or vomiting Anesthetic complications: no   No notable events documented.   Last Vitals:  Vitals:   11/16/24 0929 11/16/24 0933  BP: (!) 148/91 (!) 141/89  Pulse: 65 64  Resp: 13 10  Temp: (!) 36.2 C (!) 36.2 C  SpO2:  94%    Last Pain:  Vitals:   11/16/24 0933  TempSrc:   PainSc: 0-No pain                 Aaren Atallah C Libbey Duce

## 2024-11-16 NOTE — Transfer of Care (Signed)
 Immediate Anesthesia Transfer of Care Note  Patient: Melanie Nash  Procedure(s) Performed: PHACOEMULSIFICATION, CATARACT, WITH IOL INSERTION 3.22 00:24.3 (Right: Eye)  Patient Location: PACU  Anesthesia Type: MAC  Level of Consciousness: awake, alert  and patient cooperative  Airway and Oxygen Therapy: Patient Spontanous Breathing and Patient connected to supplemental oxygen  Post-op Assessment: Post-op Vital signs reviewed, Patient's Cardiovascular Status Stable, Respiratory Function Stable, Patent Airway and No signs of Nausea or vomiting  Post-op Vital Signs: Reviewed and stable  Complications: No notable events documented.

## 2024-12-08 NOTE — Discharge Instructions (Signed)

## 2024-12-12 NOTE — Anesthesia Preprocedure Evaluation (Signed)
 Anesthesia Evaluation  Patient identified by MRN, date of birth, ID band Patient awake    Reviewed: Allergy & Precautions, H&P , NPO status , Patient's Chart, lab work & pertinent test results  Airway Mallampati: IV  TM Distance: <3 FB Neck ROM: Full    Dental no notable dental hx. (+) Caps  Caps Cap or crown right upper central incisor :  :   Pulmonary neg pulmonary ROS   Pulmonary exam normal breath sounds clear to auscultation       Cardiovascular hypertension, Normal cardiovascular exam Rhythm:Regular Rate:Normal  10-08-19  1. Left ventricular ejection fraction, by visual estimation, is 60 to  65%. The left ventricle has normal function. Normal left ventricular size.  There is mildly increased left ventricular hypertrophy.   2. Left ventricular diastolic Doppler parameters are consistent with  impaired relaxation pattern of LV diastolic filling.   3. Global right ventricle has normal systolic function.The right  ventricular size is normal. No increase in right ventricular wall  thickness.   4. Left atrial size was normal.   5. Right atrial size was normal.   6. The mitral valve is normal in structure. No evidence of mitral valve  regurgitation. No evidence of mitral stenosis.   7. The tricuspid valve is normal in structure. Tricuspid valve  regurgitation is mild.   8. The aortic valve is normal in structure. Aortic valve regurgitation  was not visualized by color flow Doppler. Structurally normal aortic  valve, with no evidence of sclerosis or stenosis.   9. The pulmonic valve was normal in structure. Pulmonic valve  regurgitation is not visualized by color flow Doppler.  10. Normal pulmonary artery systolic pressure.  11. The inferior vena cava is normal in size with greater than 50%  respiratory variability, suggesting right atrial pressure of 3 mmHg.    06-17-24 1. Successful endovascular repair of aneurysmal  dissection of the  descending thoracic aorta. The left subclavian snorkel stent graft  remains widely patent. The excluded aneurysm sac is decreasing in  size and now measures up to 4.8 cm compared to 5.7 cm previously. No  evidence of endoleak or other complication.  2. Scattered atherosclerotic plaque throughout the abdominal aorta  without evidence of aneurysm or dissection.  3. No acute abnormality within the chest, abdomen or pelvis.  4. Coronary artery atherosclerotic vascular calcifications.  5. Mild cardiomegaly.  6. Colonic diverticular disease without CT evidence of active  inflammation.  7. Stable chronic L1 compression fracture with 60% height loss.    06-17-24 office note: Doing very well 1 year status post thoracic branch endoprosthesis as well as left brachial artery bypass. CT scan from today shows good seal of aortic endograft with a patent stent in the left clavian artery. I do not see any endoleaks. Her brachial artery bypass is widely patent on duplex today and she is a palpable left radial pulse. -I will plan to see her back in 1 year with repeat CTA to evaluate her endograft.       Neuro/Psych  Neuromuscular disease negative neurological ROS  negative psych ROS   GI/Hepatic negative GI ROS, Neg liver ROS, hiatal hernia,GERD  ,,  Endo/Other  negative endocrine ROSHypothyroidism    Renal/GU negative Renal ROS  negative genitourinary   Musculoskeletal negative musculoskeletal ROS (+) Arthritis ,  Fibromyalgia -  Abdominal   Peds negative pediatric ROS (+)  Hematology negative hematology ROS (+)   Anesthesia Other Findings Previous cataract surgery 11-16-24 Dr. Ola  Fibromyalgia  Seasonal allergies Arthritis GERD (gastroesophageal reflux disease) Diverticulosis Hiatal hernia Esophageal stricture Hypertension Allergy Fibromyalgia Hypothyroidism Aneurysm of descending thoracic aorta without rupture Restless leg syndrome Mild tricuspid regurgitation  by prior echocardiogram    Reproductive/Obstetrics negative OB ROS                              Anesthesia Physical Anesthesia Plan  ASA: 3  Anesthesia Plan: MAC   Post-op Pain Management:    Induction: Intravenous  PONV Risk Score and Plan:   Airway Management Planned: Natural Airway and Nasal Cannula  Additional Equipment:   Intra-op Plan:   Post-operative Plan:   Informed Consent: I have reviewed the patients History and Physical, chart, labs and discussed the procedure including the risks, benefits and alternatives for the proposed anesthesia with the patient or authorized representative who has indicated his/her understanding and acceptance.     Dental Advisory Given  Plan Discussed with: Anesthesiologist, CRNA and Surgeon  Anesthesia Plan Comments: (Patient consented for risks of anesthesia including but not limited to:  - adverse reactions to medications - damage to eyes, teeth, lips or other oral mucosa - nerve damage due to positioning  - sore throat or hoarseness - Damage to heart, brain, nerves, lungs, other parts of body or loss of life  Patient voiced understanding and assent.)         Anesthesia Quick Evaluation

## 2024-12-13 ENCOUNTER — Other Ambulatory Visit: Payer: Self-pay

## 2024-12-13 ENCOUNTER — Encounter: Admission: RE | Disposition: A | Payer: Self-pay | Source: Home / Self Care | Attending: Ophthalmology

## 2024-12-13 ENCOUNTER — Encounter: Payer: Self-pay | Admitting: Ophthalmology

## 2024-12-13 ENCOUNTER — Ambulatory Visit: Payer: Self-pay | Admitting: Anesthesiology

## 2024-12-13 ENCOUNTER — Ambulatory Visit
Admission: RE | Admit: 2024-12-13 | Discharge: 2024-12-13 | Disposition: A | Attending: Ophthalmology | Admitting: Ophthalmology

## 2024-12-13 DIAGNOSIS — K449 Diaphragmatic hernia without obstruction or gangrene: Secondary | ICD-10-CM | POA: Insufficient documentation

## 2024-12-13 DIAGNOSIS — I10 Essential (primary) hypertension: Secondary | ICD-10-CM | POA: Diagnosis not present

## 2024-12-13 DIAGNOSIS — K219 Gastro-esophageal reflux disease without esophagitis: Secondary | ICD-10-CM | POA: Diagnosis not present

## 2024-12-13 DIAGNOSIS — H2511 Age-related nuclear cataract, right eye: Secondary | ICD-10-CM | POA: Diagnosis present

## 2024-12-13 DIAGNOSIS — E039 Hypothyroidism, unspecified: Secondary | ICD-10-CM | POA: Insufficient documentation

## 2024-12-13 DIAGNOSIS — H2512 Age-related nuclear cataract, left eye: Secondary | ICD-10-CM | POA: Diagnosis not present

## 2024-12-13 HISTORY — PX: CATARACT EXTRACTION W/PHACO: SHX586

## 2024-12-13 SURGERY — PHACOEMULSIFICATION, CATARACT, WITH IOL INSERTION
Anesthesia: Monitor Anesthesia Care | Laterality: Left

## 2024-12-13 MED ORDER — FENTANYL CITRATE (PF) 100 MCG/2ML IJ SOLN
INTRAMUSCULAR | Status: AC
  Filled 2024-12-13: qty 2

## 2024-12-13 MED ORDER — MIDAZOLAM HCL (PF) 2 MG/2ML IJ SOLN
INTRAMUSCULAR | Status: DC | PRN
  Administered 2024-12-13: 09:00:00 1 mg via INTRAVENOUS

## 2024-12-13 MED ORDER — LACTATED RINGERS IV SOLN: INTRAVENOUS | Status: DC

## 2024-12-13 MED ORDER — SIGHTPATH DOSE#1 BSS IO SOLN
INTRAOCULAR | Status: DC | PRN
  Administered 2024-12-13: 09:00:00 15 mL via INTRAOCULAR

## 2024-12-13 MED ORDER — FENTANYL CITRATE (PF) 100 MCG/2ML IJ SOLN
INTRAMUSCULAR | Status: DC | PRN
  Administered 2024-12-13: 09:00:00 50 ug via INTRAVENOUS

## 2024-12-13 MED ORDER — TETRACAINE HCL 0.5 % OP SOLN
OPHTHALMIC | Status: AC
  Filled 2024-12-13: qty 4

## 2024-12-13 MED ORDER — SIGHTPATH DOSE#1 BSS IO SOLN
INTRAOCULAR | Status: DC | PRN
  Administered 2024-12-13: 09:00:00 81 mL via OPHTHALMIC

## 2024-12-13 MED ORDER — CEFUROXIME OPHTHALMIC INJECTION 1 MG/0.1 ML
INJECTION | OPHTHALMIC | Status: DC | PRN
  Administered 2024-12-13: 09:00:00 .1 mL via INTRACAMERAL

## 2024-12-13 MED ORDER — PHENYLEPHRINE HCL 10 % OP SOLN
1.0000 [drp] | OPHTHALMIC | Status: DC | PRN
  Administered 2024-12-13 (×2): 1 [drp] via OPHTHALMIC

## 2024-12-13 MED ORDER — TETRACAINE HCL 0.5 % OP SOLN
1.0000 [drp] | OPHTHALMIC | Status: DC | PRN
  Administered 2024-12-13 (×3): 1 [drp] via OPHTHALMIC

## 2024-12-13 MED ORDER — MIDAZOLAM HCL 2 MG/2ML IJ SOLN
INTRAMUSCULAR | Status: AC
  Filled 2024-12-13: qty 2

## 2024-12-13 MED ORDER — LIDOCAINE HCL (PF) 2 % IJ SOLN
INTRAOCULAR | Status: DC | PRN
  Administered 2024-12-13: 09:00:00 1 mL

## 2024-12-13 MED ORDER — CYCLOPENTOLATE HCL 2 % OP SOLN
OPHTHALMIC | Status: AC
  Filled 2024-12-13: qty 2

## 2024-12-13 MED ORDER — PHENYLEPHRINE HCL 10 % OP SOLN
OPHTHALMIC | Status: AC
  Filled 2024-12-13: qty 5

## 2024-12-13 MED ORDER — BRIMONIDINE TARTRATE-TIMOLOL 0.2-0.5 % OP SOLN
OPHTHALMIC | Status: DC | PRN
  Administered 2024-12-13: 09:00:00 1 [drp] via OPHTHALMIC

## 2024-12-13 MED ORDER — SIGHTPATH DOSE#1 NA HYALUR & NA CHOND-NA HYALUR IO KIT
PACK | INTRAOCULAR | Status: DC | PRN
  Administered 2024-12-13: 09:00:00 1 via OPHTHALMIC

## 2024-12-13 MED ORDER — CYCLOPENTOLATE HCL 2 % OP SOLN
1.0000 [drp] | OPHTHALMIC | Status: DC | PRN
  Administered 2024-12-13 (×2): 1 [drp] via OPHTHALMIC

## 2024-12-13 SURGICAL SUPPLY — 8 items
FEE CATARACT SUITE SIGHTPATH (MISCELLANEOUS) ×1 IMPLANT
GLOVE BIOGEL PI IND STRL 8 (GLOVE) ×1 IMPLANT
GLOVE SURG LX STRL 7.5 STRW (GLOVE) ×1 IMPLANT
GLOVE SURG SYN 6.5 PF PI BL (GLOVE) ×1 IMPLANT
LENS IOL TECNIS EYHANCE 22.5 (Intraocular Lens) IMPLANT
NDL FILTER BLUNT 18X1 1/2 (NEEDLE) ×1 IMPLANT
NEEDLE FILTER BLUNT 18X1 1/2 (NEEDLE) ×1 IMPLANT
SYR 3ML LL SCALE MARK (SYRINGE) ×1 IMPLANT

## 2024-12-13 NOTE — Transfer of Care (Signed)
 Immediate Anesthesia Transfer of Care Note  Patient: Melanie Nash  Procedure(s) Performed: PHACOEMULSIFICATION, CATARACT, WITH IOL INSERTION (Left)  Patient Location: PACU  Anesthesia Type: MAC  Level of Consciousness: awake, alert  and patient cooperative  Airway and Oxygen Therapy: Patient Spontanous Breathing   Post-op Assessment: Post-op Vital signs reviewed, Patient's Cardiovascular Status Stable, Respiratory Function Stable, Patent Airway and No signs of Nausea or vomiting  Post-op Vital Signs: Reviewed and stable  Complications: No notable events documented.

## 2024-12-13 NOTE — H&P (Signed)
 [facility]   Primary Care Physician:  Burney Darice CROME, MD Ophthalmologist: Dr. Dene Etienne  Pre-Procedure History & Physical: HPI:  Melanie Nash is a 78 y.o. female here for ophthalmic surgery.   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 Postoperatively, had acute left arm ischemia requiring left brachial artery bypass   Arthritis    Diverticulosis    Esophageal stricture    Fibromyalgia    Fibromyalgia    GERD (gastroesophageal reflux disease)    Hiatal hernia    Hypertension    Hypothyroidism    Mild tricuspid regurgitation by prior echocardiogram    Restless leg syndrome    Seasonal allergies    sinus infection    Past Surgical History:  Procedure Laterality Date   BRACHIAL ARTERY GRAFT Left    052024   CATARACT EXTRACTION W/PHACO Right 11/16/2024   Procedure: PHACOEMULSIFICATION, CATARACT, WITH IOL INSERTION 3.22 00:24.3;  Surgeon: Etienne Dene, MD;  Location: [facility];  Service: Ophthalmology;  Laterality: Right;   COLONOSCOPY     ESOPHAGOGASTRODUODENOSCOPY     dysphagia   ESOPHAGOGASTRODUODENOSCOPY N/A 02/26/2016   Procedure: ESOPHAGOGASTRODUODENOSCOPY (EGD);  Surgeon: Toribio SHAUNNA Cedar, MD;  Location: [facility] ENDOSCOPY;  Service: Endoscopy;  Laterality: N/A;   ESOPHAGOGASTRODUODENOSCOPY Left 01/02/2020   Procedure: ESOPHAGOGASTRODUODENOSCOPY (EGD);  Surgeon: Leigh Elspeth SHAUNNA, MD;  Location: [facility] ENDOSCOPY;  Service: Gastroenterology;  Laterality: Left;   FOREIGN BODY REMOVAL  01/02/2020   Procedure: FOREIGN BODY REMOVAL;  Surgeon: Leigh Elspeth SHAUNNA, MD;  Location: [facility] ENDOSCOPY;  Service: Gastroenterology;;   GRAFT DESCENDING THORACIC AORTA N/A    04/2023   SAPHENOUS VEIN GRAFT RESECTION Left    04/2023    Prior to Admission medications  Medication Sig Start Date End Date  Taking? Authorizing Provider  Ascorbic Acid (VITAMIN C PO) Take 5 mLs by mouth 3 (three) times daily.   Yes [provider]  Ascorbic Acid (VITAMIN C) 500 MG/5ML LIQD Take 5 mLs by mouth 3 (three) times daily.   Yes [provider]  carvedilol  (COREG ) 12.5 MG tablet Take 1 tablet (12.5 mg total) by mouth 2 (two) times daily with a meal. 10/14/19  Yes Gonfa, Taye T, MD  Cholecalciferol (VITAMIN D3) 50 MCG (2000 UT) capsule Take 2,000 Units by mouth daily.   Yes [provider]  cloNIDine (CATAPRES) 0.1 MG tablet Take 0.1 mg by mouth daily.   Yes [provider]  Homeopathic Products (ZICAM ALLERGY RELIEF NA) Place 1 tablet into the nose daily.   Yes [provider]  levothyroxine (SYNTHROID) 25 MCG tablet Take 25 mcg by mouth daily before breakfast.   Yes [provider]  loratadine  (CLARITIN ) 10 MG tablet Take 10 mg by mouth daily.   Yes [provider]  losartan-hydrochlorothiazide (HYZAAR) 100-12.5 MG tablet Take 1 tablet by mouth daily.   Yes [provider]  Magnesium 300 MG TABS Take 325 mg by mouth daily.   Yes [provider]  Melatonin 10 MG TABS Take 1 tablet by mouth at bedtime.   Yes [provider]  montelukast (SINGULAIR) 10 MG tablet Take 10 mg by mouth at bedtime.   Yes [provider]  Multiple Vitamins-Minerals (VITAFUSION MULTI WOMENS PO) Take 1 tablet by mouth daily.   Yes [provider]  NON FORMULARY Take 1 tablet by mouth  daily. RESPIRATORY SUPPORT AND DEFENSE   Yes [provider]  rOPINIRole (REQUIP) 1 MG tablet Take 1 mg by mouth daily.   Yes [provider]  rosuvastatin (CRESTOR) 10 MG tablet Take 10 mg by mouth daily.   Yes [provider]  SAMBUCOL BLACK ELDERBERRY PO Take 1.7 g by mouth daily.   Yes [provider]  solifenacin (VESICARE) 5 MG tablet Take 5 mg by mouth daily.   Yes [provider]  omeprazole   (PRILOSEC) 40 MG capsule TAKE 1 CAPSULE BY MOUTH EVERY DAY Patient taking differently: Take 20 mg by mouth daily. 07/27/23   Melanie Gwendlyn DASEN, MD    Allergies as of 10/27/2024   (No Known Allergies)    Family History  Problem Relation Age of Onset   Heart disease Mother    Diabetes Sister    Diabetes Maternal Uncle    AAA (abdominal aortic aneurysm) Father    Colon cancer Neg Hx    Stomach cancer Neg Hx     Social History   Socioeconomic History   Marital status: Married    Spouse name: Not on file   Number of children: 2   Years of education: Not on file   Highest education level: Not on file  Occupational History   Occupation: caterer  Tobacco Use   Smoking status: Never   Smokeless tobacco: Never  Vaping Use   Vaping status: Never Used  Substance and Sexual Activity   Alcohol use: No    Alcohol/week: 0.0 standard drinks of alcohol   Drug use: No   Sexual activity: Not on file  Other Topics Concern   Not on file  Social History Narrative   Not on file   Social Drivers of Health   Tobacco Use: Low Risk (12/13/2024)   Patient History    Smoking Tobacco Use: Never    Smokeless Tobacco Use: Never    Passive Exposure: Not on file  Financial Resource Strain: Not on file  Food Insecurity: Not on file  Transportation Needs: Not on file  Physical Activity: Not on file  Stress: Not on file  Social Connections: Not on file  Intimate Partner Violence: Not on file  Depression (EYV7-0): Not on file  Alcohol Screen: Not on file  Housing: Not on file  Utilities: Not on file  Health Literacy: Not on file    Review of Systems: See HPI, otherwise negative ROS  Physical Exam: BP (!) 152/92   Pulse 66   Temp (!) 97.5 F (36.4 C) (Temporal)   Ht 5' 2 (1.575 m)   Wt 83.9 kg   SpO2 100%   BMI 33.84 kg/m  General:   Alert,  pleasant and cooperative in NAD Head:  Normocephalic and atraumatic. Lungs:  Clear to auscultation.    Heart:  Regular rate and rhythm.    Impression/Plan: [name] is here for ophthalmic surgery.  Risks, benefits, limitations, and alternatives regarding ophthalmic surgery have been reviewed with the patient.  Questions have been answered.  All parties agreeable.   MITTIE GASKIN, MD  12/13/2024, 8:12 AM

## 2024-12-13 NOTE — Anesthesia Postprocedure Evaluation (Signed)
 Anesthesia Post Note  Patient: Melanie Nash  Procedure(s) Performed: PHACOEMULSIFICATION, CATARACT, WITH IOL INSERTION 3.06, 00:29.5 (Left)  Patient location during evaluation: PACU Anesthesia Type: MAC Level of consciousness: awake and alert Pain management: pain level controlled Vital Signs Assessment: post-procedure vital signs reviewed and stable Respiratory status: spontaneous breathing, nonlabored ventilation, respiratory function stable and patient connected to nasal cannula oxygen Cardiovascular status: stable and blood pressure returned to baseline Postop Assessment: no apparent nausea or vomiting Anesthetic complications: no   No notable events documented.   Last Vitals:  Vitals:   12/13/24 0859 12/13/24 0904  BP: 131/79   Pulse: 66 63  Resp: 13 16  Temp: (!) 35.8 C (!) 35.8 C  SpO2:  95%    Last Pain:  Vitals:   12/13/24 0904  TempSrc:   PainSc: 0-No pain                 Donny JAYSON Mu

## 2024-12-13 NOTE — Op Note (Signed)
 OPERATIVE NOTE  Melanie Nash [id number] 12/13/2024   PREOPERATIVE DIAGNOSIS:  Nuclear sclerotic cataract left eye. H25.12   POSTOPERATIVE DIAGNOSIS:    Nuclear sclerotic cataract left eye.     PROCEDURE:  Phacoemusification with posterior chamber intraocular lens placement of the left eye  Ultrasound time: Procedures: PHACOEMULSIFICATION, CATARACT, WITH IOL INSERTION (Left)  LENS:   Implant Name Type Inv. Item Serial No. Manufacturer Lot No. LRB No. Used Action  LENS IOL TECNIS EYHANCE 22.5 - [id number] Intraocular Lens LENS IOL TECNIS EYHANCE 22.5 [id number] [employer]  Left 1 Implanted      SURGEON:  Dene FABIENE Etienne, MD   ANESTHESIA:  Topical with tetracaine  drops and 2% Xylocaine  jelly, augmented with 1% preservative-free intracameral lidocaine .    COMPLICATIONS:  None.   DESCRIPTION OF PROCEDURE:  The patient was identified in the holding room and transported to the operating room and placed in the supine position under the operating microscope.  The left eye was identified as the operative eye and it was prepped and draped in the usual sterile ophthalmic fashion.   A 1 millimeter clear-corneal paracentesis was made at the 1:30 position.  0.5 ml of preservative-free 1% lidocaine  was injected into the anterior chamber.  The anterior chamber was filled with Viscoat viscoelastic.  A 2.4 millimeter keratome was used to make a near-clear corneal incision at the 10:30 position.  .  A curvilinear capsulorrhexis was made with a cystotome and capsulorrhexis forceps.  Balanced salt solution was used to hydrodissect and hydrodelineate the nucleus.   Phacoemulsification was then used in stop and chop fashion to remove the lens nucleus and epinucleus.  The remaining cortex was then removed using the irrigation and aspiration handpiece. Provisc was then placed into the capsular bag to distend it for lens placement.  A lens was then injected into the capsular bag.  The remaining  viscoelastic was aspirated.   Wounds were hydrated with balanced salt solution.  The anterior chamber was inflated to a physiologic pressure with balanced salt solution.  No wound leaks were noted. Cefuroxime  0.1 ml of a 10mg /ml solution was injected into the anterior chamber for a dose of 1 mg of intracameral antibiotic at the completion of the case.   Timolol  and Brimonidine  drops were applied to the eye.  The patient was taken to the recovery room in stable condition without complications of anesthesia or surgery.  Melanie Nash 12/13/2024, 8:57 AM
